# Patient Record
Sex: Male | Born: 1950 | Race: White | Hispanic: No | Smoking: Former smoker
Health system: Southern US, Community
[De-identification: ages and names within clinical notes are randomized; demographics above are authoritative.]

## PROBLEM LIST (undated history)

## (undated) DIAGNOSIS — IMO0002 Reserved for concepts with insufficient information to code with codable children: Secondary | ICD-10-CM

## (undated) DIAGNOSIS — R9430 Abnormal result of cardiovascular function study, unspecified: Secondary | ICD-10-CM

## (undated) DIAGNOSIS — R51 Headache: Secondary | ICD-10-CM

## (undated) DIAGNOSIS — I2699 Other pulmonary embolism without acute cor pulmonale: Secondary | ICD-10-CM

## (undated) DIAGNOSIS — E785 Hyperlipidemia, unspecified: Secondary | ICD-10-CM

## (undated) DIAGNOSIS — Z7901 Long term (current) use of anticoagulants: Secondary | ICD-10-CM

## (undated) DIAGNOSIS — I779 Disorder of arteries and arterioles, unspecified: Secondary | ICD-10-CM

## (undated) DIAGNOSIS — Z8719 Personal history of other diseases of the digestive system: Secondary | ICD-10-CM

## (undated) DIAGNOSIS — IMO0001 Reserved for inherently not codable concepts without codable children: Secondary | ICD-10-CM

## (undated) DIAGNOSIS — R011 Cardiac murmur, unspecified: Secondary | ICD-10-CM

## (undated) DIAGNOSIS — Z8701 Personal history of pneumonia (recurrent): Secondary | ICD-10-CM

## (undated) DIAGNOSIS — G43909 Migraine, unspecified, not intractable, without status migrainosus: Secondary | ICD-10-CM

## (undated) DIAGNOSIS — G8929 Other chronic pain: Secondary | ICD-10-CM

## (undated) DIAGNOSIS — K469 Unspecified abdominal hernia without obstruction or gangrene: Secondary | ICD-10-CM

## (undated) DIAGNOSIS — I4510 Unspecified right bundle-branch block: Secondary | ICD-10-CM

## (undated) DIAGNOSIS — I2510 Atherosclerotic heart disease of native coronary artery without angina pectoris: Secondary | ICD-10-CM

## (undated) DIAGNOSIS — I350 Nonrheumatic aortic (valve) stenosis: Secondary | ICD-10-CM

## (undated) DIAGNOSIS — R519 Headache, unspecified: Secondary | ICD-10-CM

## (undated) DIAGNOSIS — M545 Low back pain, unspecified: Secondary | ICD-10-CM

## (undated) DIAGNOSIS — J449 Chronic obstructive pulmonary disease, unspecified: Secondary | ICD-10-CM

## (undated) DIAGNOSIS — I739 Peripheral vascular disease, unspecified: Secondary | ICD-10-CM

## (undated) DIAGNOSIS — K219 Gastro-esophageal reflux disease without esophagitis: Secondary | ICD-10-CM

## (undated) DIAGNOSIS — Z720 Tobacco use: Secondary | ICD-10-CM

## (undated) DIAGNOSIS — I10 Essential (primary) hypertension: Secondary | ICD-10-CM

## (undated) HISTORY — PX: CARPAL TUNNEL RELEASE: SHX101

## (undated) HISTORY — DX: Hyperlipidemia, unspecified: E78.5

## (undated) HISTORY — DX: Atherosclerotic heart disease of native coronary artery without angina pectoris: I25.10

## (undated) HISTORY — DX: Reserved for concepts with insufficient information to code with codable children: IMO0002

## (undated) HISTORY — PX: LUMBAR DISC SURGERY: SHX700

## (undated) HISTORY — DX: Chronic obstructive pulmonary disease, unspecified: J44.9

## (undated) HISTORY — DX: Unspecified right bundle-branch block: I45.10

## (undated) HISTORY — PX: BACK SURGERY: SHX140

## (undated) HISTORY — DX: Abnormal result of cardiovascular function study, unspecified: R94.30

## (undated) HISTORY — DX: Disorder of arteries and arterioles, unspecified: I77.9

## (undated) HISTORY — DX: Unspecified abdominal hernia without obstruction or gangrene: K46.9

## (undated) HISTORY — DX: Other pulmonary embolism without acute cor pulmonale: I26.99

## (undated) HISTORY — DX: Tobacco use: Z72.0

## (undated) HISTORY — DX: Peripheral vascular disease, unspecified: I73.9

## (undated) HISTORY — PX: TONSILLECTOMY: SUR1361

## (undated) HISTORY — DX: Essential (primary) hypertension: I10

## (undated) HISTORY — DX: Nonrheumatic aortic (valve) stenosis: I35.0

## (undated) HISTORY — DX: Long term (current) use of anticoagulants: Z79.01

---

## 1996-09-02 HISTORY — PX: CHOLECYSTECTOMY: SHX55

## 2004-09-02 HISTORY — PX: POSTERIOR LUMBAR FUSION: SHX6036

## 2005-09-02 DIAGNOSIS — I2699 Other pulmonary embolism without acute cor pulmonale: Secondary | ICD-10-CM

## 2005-09-02 HISTORY — DX: Other pulmonary embolism without acute cor pulmonale: I26.99

## 2005-10-08 ENCOUNTER — Ambulatory Visit: Payer: Self-pay | Admitting: Cardiology

## 2005-11-05 ENCOUNTER — Ambulatory Visit: Payer: Self-pay | Admitting: Internal Medicine

## 2006-04-25 ENCOUNTER — Ambulatory Visit: Payer: Self-pay | Admitting: Cardiology

## 2006-04-25 ENCOUNTER — Ambulatory Visit: Payer: Self-pay | Admitting: Internal Medicine

## 2006-04-25 ENCOUNTER — Ambulatory Visit: Payer: Self-pay | Admitting: Cardiovascular Disease

## 2006-04-25 ENCOUNTER — Ambulatory Visit: Payer: Self-pay | Admitting: Endocrinology

## 2006-04-25 ENCOUNTER — Inpatient Hospital Stay (HOSPITAL_COMMUNITY): Admission: AD | Admit: 2006-04-25 | Discharge: 2006-04-29 | Payer: Self-pay | Admitting: Internal Medicine

## 2006-05-01 ENCOUNTER — Ambulatory Visit: Payer: Self-pay | Admitting: Cardiology

## 2006-05-08 ENCOUNTER — Ambulatory Visit: Payer: Self-pay | Admitting: Cardiology

## 2006-05-15 ENCOUNTER — Ambulatory Visit: Payer: Self-pay | Admitting: Cardiology

## 2006-05-19 ENCOUNTER — Ambulatory Visit: Payer: Self-pay | Admitting: Internal Medicine

## 2006-05-29 ENCOUNTER — Ambulatory Visit: Payer: Self-pay | Admitting: Cardiology

## 2006-06-19 ENCOUNTER — Ambulatory Visit: Payer: Self-pay | Admitting: Internal Medicine

## 2006-07-17 ENCOUNTER — Ambulatory Visit: Payer: Self-pay | Admitting: Cardiology

## 2006-08-14 ENCOUNTER — Ambulatory Visit: Payer: Self-pay | Admitting: Cardiology

## 2006-09-11 ENCOUNTER — Ambulatory Visit: Payer: Self-pay | Admitting: Cardiology

## 2006-10-09 ENCOUNTER — Ambulatory Visit: Payer: Self-pay | Admitting: Cardiology

## 2006-11-06 ENCOUNTER — Ambulatory Visit: Payer: Self-pay | Admitting: Cardiology

## 2006-11-25 ENCOUNTER — Ambulatory Visit: Payer: Self-pay | Admitting: Cardiology

## 2006-11-25 LAB — CONVERTED CEMR LAB
ALT: 28 units/L (ref 0–40)
AST: 27 units/L (ref 0–37)
Bilirubin, Direct: 0.1 mg/dL (ref 0.0–0.3)
Cholesterol: 167 mg/dL (ref 0–200)
HDL: 35.1 mg/dL — ABNORMAL LOW (ref >39.0)
Total Protein: 7.2 g/dL (ref 6.0–8.3)

## 2006-11-27 ENCOUNTER — Ambulatory Visit: Payer: Self-pay | Admitting: Cardiology

## 2006-12-18 ENCOUNTER — Ambulatory Visit: Payer: Self-pay | Admitting: Internal Medicine

## 2007-01-01 ENCOUNTER — Ambulatory Visit: Payer: Self-pay | Admitting: Cardiovascular Disease

## 2007-01-29 ENCOUNTER — Ambulatory Visit: Payer: Self-pay | Admitting: *Deleted

## 2007-02-12 ENCOUNTER — Ambulatory Visit: Payer: Self-pay | Admitting: Cardiology

## 2007-03-17 ENCOUNTER — Ambulatory Visit: Payer: Self-pay | Admitting: Cardiovascular Disease

## 2007-04-02 ENCOUNTER — Ambulatory Visit: Payer: Self-pay | Admitting: Internal Medicine

## 2007-04-07 ENCOUNTER — Ambulatory Visit: Payer: Self-pay | Admitting: Cardiology

## 2007-04-23 ENCOUNTER — Ambulatory Visit: Payer: Self-pay | Admitting: Cardiology

## 2007-05-08 ENCOUNTER — Ambulatory Visit: Payer: Self-pay | Admitting: Internal Medicine

## 2007-05-22 ENCOUNTER — Ambulatory Visit: Payer: Self-pay | Admitting: Cardiology

## 2007-06-12 ENCOUNTER — Ambulatory Visit: Payer: Self-pay | Admitting: Cardiology

## 2007-06-20 ENCOUNTER — Encounter: Payer: Self-pay | Admitting: Internal Medicine

## 2007-06-20 DIAGNOSIS — E785 Hyperlipidemia, unspecified: Secondary | ICD-10-CM | POA: Insufficient documentation

## 2007-06-20 DIAGNOSIS — G5600 Carpal tunnel syndrome, unspecified upper limb: Secondary | ICD-10-CM | POA: Insufficient documentation

## 2007-07-10 ENCOUNTER — Ambulatory Visit: Payer: Self-pay | Admitting: Cardiology

## 2007-08-07 ENCOUNTER — Ambulatory Visit: Payer: Self-pay | Admitting: Cardiology

## 2007-09-04 ENCOUNTER — Ambulatory Visit: Payer: Self-pay | Admitting: Cardiology

## 2007-09-16 ENCOUNTER — Ambulatory Visit: Payer: Self-pay | Admitting: Internal Medicine

## 2007-09-21 ENCOUNTER — Ambulatory Visit: Payer: Self-pay | Admitting: Cardiology

## 2007-10-02 ENCOUNTER — Ambulatory Visit: Payer: Self-pay | Admitting: Cardiology

## 2007-10-13 ENCOUNTER — Ambulatory Visit: Payer: Self-pay | Admitting: Cardiology

## 2007-10-13 ENCOUNTER — Encounter: Payer: Self-pay | Admitting: Cardiology

## 2007-10-13 ENCOUNTER — Encounter: Payer: Self-pay | Admitting: Internal Medicine

## 2007-10-16 ENCOUNTER — Ambulatory Visit: Payer: Self-pay | Admitting: Internal Medicine

## 2007-11-13 ENCOUNTER — Ambulatory Visit: Payer: Self-pay | Admitting: Internal Medicine

## 2007-12-03 ENCOUNTER — Ambulatory Visit: Payer: Self-pay | Admitting: Internal Medicine

## 2007-12-03 LAB — CONVERTED CEMR LAB
ALT: 29 units/L (ref 0–53)
Albumin: 3.5 g/dL (ref 3.5–5.2)
BUN: 11 mg/dL (ref 6–23)
Basophils Absolute: 0.1 10*3/uL (ref 0.0–0.1)
Basophils Relative: 0.6 % (ref 0.0–1.0)
CO2: 30 meq/L (ref 19–32)
Calcium: 9.1 mg/dL (ref 8.4–10.5)
Creatinine, Ser: 1.1 mg/dL (ref 0.4–1.5)
Direct LDL: 91.2 mg/dL
Eosinophils Relative: 0.9 % (ref 0.0–5.0)
Glucose, Bld: 115 mg/dL — ABNORMAL HIGH (ref 70–99)
Hemoglobin: 18.2 g/dL — ABNORMAL HIGH (ref 13.0–17.0)
Lymphocytes Relative: 21.3 % (ref 12.0–46.0)
MCHC: 32.9 g/dL (ref 30.0–36.0)
MCV: 96 fL (ref 78.0–100.0)
Neutro Abs: 8 10*3/uL — ABNORMAL HIGH (ref 1.4–7.7)
RBC: 5.78 M/uL (ref 4.22–5.81)
Total Bilirubin: 1 mg/dL (ref 0.3–1.2)
Total Protein: 7.2 g/dL (ref 6.0–8.3)
VLDL: 41 mg/dL — ABNORMAL HIGH (ref 0–40)
Vitamin B-12: 656 pg/mL (ref 211–911)

## 2007-12-05 ENCOUNTER — Encounter: Payer: Self-pay | Admitting: Internal Medicine

## 2007-12-10 ENCOUNTER — Ambulatory Visit: Payer: Self-pay | Admitting: Internal Medicine

## 2007-12-23 ENCOUNTER — Telehealth: Payer: Self-pay | Admitting: Internal Medicine

## 2007-12-25 ENCOUNTER — Telehealth: Payer: Self-pay | Admitting: Internal Medicine

## 2007-12-26 ENCOUNTER — Encounter: Payer: Self-pay | Admitting: Internal Medicine

## 2008-01-01 ENCOUNTER — Telehealth: Payer: Self-pay | Admitting: Internal Medicine

## 2008-01-04 ENCOUNTER — Encounter: Payer: Self-pay | Admitting: Internal Medicine

## 2008-01-07 ENCOUNTER — Ambulatory Visit: Payer: Self-pay | Admitting: Internal Medicine

## 2008-01-22 ENCOUNTER — Ambulatory Visit: Payer: Self-pay | Admitting: Cardiovascular Disease

## 2008-02-19 ENCOUNTER — Ambulatory Visit: Payer: Self-pay | Admitting: Internal Medicine

## 2008-03-18 ENCOUNTER — Ambulatory Visit: Payer: Self-pay | Admitting: Cardiology

## 2008-04-15 ENCOUNTER — Ambulatory Visit: Payer: Self-pay | Admitting: Internal Medicine

## 2008-05-05 ENCOUNTER — Ambulatory Visit: Payer: Self-pay | Admitting: Internal Medicine

## 2008-06-02 ENCOUNTER — Ambulatory Visit: Payer: Self-pay | Admitting: Internal Medicine

## 2008-06-23 ENCOUNTER — Ambulatory Visit: Payer: Self-pay | Admitting: Cardiology

## 2008-07-07 ENCOUNTER — Ambulatory Visit: Payer: Self-pay | Admitting: Cardiology

## 2008-08-04 ENCOUNTER — Ambulatory Visit: Payer: Self-pay | Admitting: Cardiology

## 2008-08-11 ENCOUNTER — Ambulatory Visit: Payer: Self-pay | Admitting: Cardiology

## 2008-08-29 ENCOUNTER — Ambulatory Visit: Payer: Self-pay | Admitting: Cardiology

## 2008-09-12 ENCOUNTER — Ambulatory Visit: Payer: Self-pay | Admitting: Cardiology

## 2008-10-03 ENCOUNTER — Ambulatory Visit: Payer: Self-pay | Admitting: Cardiology

## 2008-10-31 ENCOUNTER — Ambulatory Visit: Payer: Self-pay | Admitting: Cardiology

## 2008-12-01 ENCOUNTER — Ambulatory Visit: Payer: Self-pay | Admitting: Cardiovascular Disease

## 2008-12-29 ENCOUNTER — Ambulatory Visit: Payer: Self-pay | Admitting: Internal Medicine

## 2009-01-26 ENCOUNTER — Ambulatory Visit: Payer: Self-pay | Admitting: Cardiovascular Disease

## 2009-01-26 ENCOUNTER — Ambulatory Visit: Payer: Self-pay | Admitting: Internal Medicine

## 2009-01-26 DIAGNOSIS — I498 Other specified cardiac arrhythmias: Secondary | ICD-10-CM | POA: Insufficient documentation

## 2009-01-26 LAB — CONVERTED CEMR LAB
BUN: 14 mg/dL (ref 6–23)
Bilirubin, Direct: 0.3 mg/dL (ref 0.0–0.3)
Chloride: 101 meq/L (ref 96–112)
Glucose, Bld: 114 mg/dL — ABNORMAL HIGH (ref 70–99)
HDL: 30.8 mg/dL — ABNORMAL LOW (ref >39.00)
Potassium: 4.5 meq/L (ref 3.5–5.1)
Sodium: 137 meq/L (ref 135–145)
TSH: 1.6 microintl units/mL (ref 0.35–5.50)
Total Bilirubin: 1.2 mg/dL (ref 0.3–1.2)
Total CHOL/HDL Ratio: 6
VLDL: 41.2 mg/dL — ABNORMAL HIGH (ref 0.0–40.0)

## 2009-02-01 ENCOUNTER — Encounter: Payer: Self-pay | Admitting: *Deleted

## 2009-02-01 ENCOUNTER — Ambulatory Visit: Payer: Self-pay | Admitting: Internal Medicine

## 2009-02-23 ENCOUNTER — Ambulatory Visit: Payer: Self-pay | Admitting: Internal Medicine

## 2009-02-23 LAB — CONVERTED CEMR LAB: Prothrombin Time: 17.3 s

## 2009-03-08 ENCOUNTER — Encounter: Payer: Self-pay | Admitting: *Deleted

## 2009-03-23 ENCOUNTER — Ambulatory Visit: Payer: Self-pay | Admitting: Cardiovascular Disease

## 2009-03-23 LAB — CONVERTED CEMR LAB
POC INR: 2.2
Prothrombin Time: 18.2 s

## 2009-04-20 ENCOUNTER — Ambulatory Visit: Payer: Self-pay | Admitting: Cardiology

## 2009-05-11 ENCOUNTER — Ambulatory Visit: Payer: Self-pay | Admitting: Cardiovascular Disease

## 2009-05-11 LAB — CONVERTED CEMR LAB: POC INR: 1.9

## 2009-05-25 ENCOUNTER — Ambulatory Visit: Payer: Self-pay | Admitting: Internal Medicine

## 2009-06-02 ENCOUNTER — Ambulatory Visit: Payer: Self-pay | Admitting: Internal Medicine

## 2009-06-30 ENCOUNTER — Ambulatory Visit: Payer: Self-pay | Admitting: Internal Medicine

## 2009-07-28 ENCOUNTER — Ambulatory Visit: Payer: Self-pay | Admitting: Internal Medicine

## 2009-07-28 LAB — CONVERTED CEMR LAB: POC INR: 3.3

## 2009-08-16 ENCOUNTER — Ambulatory Visit: Payer: Self-pay | Admitting: Cardiology

## 2009-09-13 ENCOUNTER — Ambulatory Visit: Payer: Self-pay | Admitting: Internal Medicine

## 2009-09-27 ENCOUNTER — Ambulatory Visit: Payer: Self-pay | Admitting: Cardiology

## 2009-09-27 LAB — CONVERTED CEMR LAB: POC INR: 1.8

## 2009-10-18 ENCOUNTER — Ambulatory Visit: Payer: Self-pay | Admitting: Internal Medicine

## 2009-10-18 LAB — CONVERTED CEMR LAB: POC INR: 2.3

## 2009-11-15 ENCOUNTER — Ambulatory Visit: Payer: Self-pay | Admitting: Internal Medicine

## 2009-12-13 ENCOUNTER — Ambulatory Visit: Payer: Self-pay | Admitting: Internal Medicine

## 2009-12-13 LAB — CONVERTED CEMR LAB: POC INR: 2.6

## 2010-01-10 ENCOUNTER — Ambulatory Visit: Payer: Self-pay | Admitting: Internal Medicine

## 2010-01-10 LAB — CONVERTED CEMR LAB: POC INR: 2.6

## 2010-02-07 ENCOUNTER — Ambulatory Visit: Payer: Self-pay | Admitting: Cardiology

## 2010-03-07 ENCOUNTER — Ambulatory Visit: Payer: Self-pay | Admitting: Internal Medicine

## 2010-04-04 ENCOUNTER — Ambulatory Visit: Payer: Self-pay | Admitting: Cardiology

## 2010-05-02 ENCOUNTER — Ambulatory Visit: Payer: Self-pay | Admitting: Cardiology

## 2010-05-02 LAB — CONVERTED CEMR LAB: POC INR: 2.4

## 2010-05-30 ENCOUNTER — Ambulatory Visit: Payer: Self-pay | Admitting: Internal Medicine

## 2010-06-13 ENCOUNTER — Ambulatory Visit: Payer: Self-pay | Admitting: Cardiology

## 2010-07-11 ENCOUNTER — Ambulatory Visit: Payer: Self-pay | Admitting: Cardiovascular Disease

## 2010-07-11 LAB — CONVERTED CEMR LAB: POC INR: 2.5

## 2010-08-08 ENCOUNTER — Ambulatory Visit: Payer: Self-pay | Admitting: Internal Medicine

## 2010-08-08 LAB — CONVERTED CEMR LAB: POC INR: 2.9

## 2010-09-06 ENCOUNTER — Ambulatory Visit: Admission: RE | Admit: 2010-09-06 | Discharge: 2010-09-06 | Payer: Self-pay | Source: Home / Self Care

## 2010-09-19 ENCOUNTER — Telehealth: Payer: Self-pay | Admitting: Cardiology

## 2010-09-23 ENCOUNTER — Encounter: Payer: Self-pay | Admitting: Endocrinology

## 2010-09-24 ENCOUNTER — Encounter: Payer: Self-pay | Admitting: Cardiology

## 2010-09-24 DIAGNOSIS — J449 Chronic obstructive pulmonary disease, unspecified: Secondary | ICD-10-CM | POA: Insufficient documentation

## 2010-09-24 DIAGNOSIS — I10 Essential (primary) hypertension: Secondary | ICD-10-CM | POA: Insufficient documentation

## 2010-09-24 DIAGNOSIS — J4489 Other specified chronic obstructive pulmonary disease: Secondary | ICD-10-CM | POA: Insufficient documentation

## 2010-09-25 ENCOUNTER — Ambulatory Visit
Admission: RE | Admit: 2010-09-25 | Discharge: 2010-09-25 | Payer: Self-pay | Source: Home / Self Care | Attending: Cardiology | Admitting: Cardiology

## 2010-09-25 ENCOUNTER — Encounter: Payer: Self-pay | Admitting: Cardiology

## 2010-09-25 DIAGNOSIS — F17200 Nicotine dependence, unspecified, uncomplicated: Secondary | ICD-10-CM | POA: Insufficient documentation

## 2010-10-02 NOTE — Medication Information (Signed)
Summary: Chris Reyes  Anticoagulant Therapy  Managed by: Bethena Midget, RN, BSN Referring MD: Willa Rough MD PCP: Link Snuffer MD: Riley Kill MD, Maisie Fus Indication 1: Pulmonary Embolism and Infarction (ICD-415.1) Lab Used: [facility] Site: [facility] INR POC 2.8 INR RANGE 2 - 3  Dietary changes: no    Health status changes: no    Bleeding/hemorrhagic complications: no    Recent/future hospitalizations: no    Any changes in medication regimen? no    Recent/future dental: no  Any missed doses?: no       Is patient compliant with meds? yes       Allergies: No Known Drug Allergies  Anticoagulation Management History:      The patient is taking warfarin and comes in today for a routine follow up visit.  Negative risk factors for bleeding include an age less than 39 years old.  The bleeding index is 'low risk'.  Negative CHADS2 values include Age > 7 years old.  The start date was 04/23/2006.  Anticoagulation responsible Lalisa Kiehn: Riley Kill MD, Maisie Fus.  INR POC: 2.8.  Cuvette Lot#: [id number].  Exp: 06/2011.    Anticoagulation Management Assessment/Plan:      The patient's current anticoagulation dose is Coumadin 5 mg tabs: Take as directed by coumadin clinic..  The target INR is 2.0-3.0.  The next INR is due 05/02/2010.  Anticoagulation instructions were given to patient.  Results were reviewed/authorized by Bethena Midget, RN, BSN.  He was notified by Bethena Midget, RN, BSN.         Prior Anticoagulation Instructions: INR 2.0  Take 1.5 tablets today, then resume same dosage 1 tablet daily except 1/2 tablet on Mondays and Fridays.  Recheck in 4 weeks.   Current Anticoagulation Instructions: INR 2.8 Continue 5mg s everyday except 2.5mg  on Mondays and Fridays. Recheck in 4 weeks.

## 2010-10-02 NOTE — Medication Information (Signed)
Summary: rov/eac  Anticoagulant Therapy  Managed by: Elaina Pattee, PharmD Referring MD: Willa Rough MD PCP: Link Snuffer MD: Juanda Chance MD, Antwan Pandya Indication 1: Pulmonary Embolism and Infarction (ICD-415.1) Lab Used: [facility] Site: [facility] INR POC 2.3 INR RANGE 2 - 3  Dietary changes: no    Health status changes: no    Bleeding/hemorrhagic complications: no    Recent/future hospitalizations: no    Any changes in medication regimen? no    Recent/future dental: no  Any missed doses?: no       Is patient compliant with meds? yes       Allergies: No Known Drug Allergies  Anticoagulation Management History:      The patient is taking warfarin and comes in today for a routine follow up visit.  Negative risk factors for bleeding include an age less than 61 years old.  The bleeding index is 'low risk'.  Negative CHADS2 values include Age > 61 years old.  The start date was 04/23/2006.  Anticoagulation responsible provider: Juanda Chance MD, Smitty Cords.  INR POC: 2.3.  Cuvette Lot#: [id number].  Exp: 04/2011.    Anticoagulation Management Assessment/Plan:      The patient's current anticoagulation dose is Coumadin 5 mg tabs: Take as directed by coumadin clinic..  The target INR is 2.0-3.0.  The next INR is due 03/07/2010.  Anticoagulation instructions were given to patient.  Results were reviewed/authorized by Elaina Pattee, PharmD.  He was notified by Elaina Pattee, PharmD.         Prior Anticoagulation Instructions: INR 2.6  Continue taking 1/2 tablet on monday, and Friday and 1 tablet all other days.  Return to clinic in 4 weeks.     Current Anticoagulation Instructions: INR 2.3. Take 1 tablet daily except 0.5 tablet on Mon and Fri. Recheck in 4 weeks.

## 2010-10-02 NOTE — Medication Information (Signed)
Summary: Chris Reyes  Anticoagulant Therapy  Managed by: Cloyde Reams, RN, BSN Referring MD: Willa Rough MD PCP: Link Snuffer MD: Johney Frame MD, Fayrene Fearing Indication 1: Pulmonary Embolism and Infarction (ICD-415.1) Lab Used: [facility] Site: [facility] INR POC 2.8 INR RANGE 2 - 3  Dietary changes: no    Health status changes: no    Bleeding/hemorrhagic complications: no    Recent/future hospitalizations: no    Any changes in medication regimen? no    Recent/future dental: no  Any missed doses?: no       Is patient compliant with meds? yes       Allergies (verified): No Known Drug Allergies  Anticoagulation Management History:      The patient is taking warfarin and comes in today for a routine follow up visit.  Negative risk factors for bleeding include an age less than 30 years old.  The bleeding index is 'low risk'.  Negative CHADS2 values include Age > 66 years old.  The start date was 04/23/2006.  Anticoagulation responsible provider: Marlette Curvin MD, Fayrene Fearing.  INR POC: 2.8.  Cuvette Lot#: [id number].  Exp: 01/2011.    Anticoagulation Management Assessment/Plan:      The patient's current anticoagulation dose is Coumadin 5 mg tabs: Take as directed by coumadin clinic..  The target INR is 2.0-3.0.  The next INR is due 12/13/2009.  Anticoagulation instructions were given to patient.  Results were reviewed/authorized by Cloyde Reams, RN, BSN.  He was notified by Cloyde Reams RN.         Prior Anticoagulation Instructions: INR 2.3  Continue on same dosage 1 tablet daily except 1/2 tablet on Mondays and Fridays.   Recheck in 4 weeks.    Current Anticoagulation Instructions: INR 2.8  Continue on same dosage 1 tablet daily except 1/2 tablet on Mondays and Fridays.  Recheck in 4 weeks.

## 2010-10-02 NOTE — Medication Information (Signed)
Summary: rov/ewj  Anticoagulant Therapy  Managed by: Bethena Midget, RN, BSN Referring MD: Willa Rough MD PCP: Link Snuffer MD: Johney Frame MD, Fayrene Fearing Indication 1: Pulmonary Embolism and Infarction (ICD-415.1) Lab Used: [facility] Site: [facility] INR POC 2.6 INR RANGE 2 - 3  Dietary changes: no    Health status changes: no    Bleeding/hemorrhagic complications: no    Recent/future hospitalizations: no    Any changes in medication regimen? no    Recent/future dental: no  Any missed doses?: no       Is patient compliant with meds? yes       Allergies: No Known Drug Allergies  Anticoagulation Management History:      The patient is taking warfarin and comes in today for a routine follow up visit.  Negative risk factors for bleeding include an age less than 60 years old.  The bleeding index is 'low risk'.  Negative CHADS2 values include Age > 26 years old.  The start date was 04/23/2006.  Anticoagulation responsible provider: Jamilya Sarrazin MD, Fayrene Fearing.  INR POC: 2.6.  Cuvette Lot#: [id number].  Exp: 01/2011.    Anticoagulation Management Assessment/Plan:      The patient's current anticoagulation dose is Coumadin 5 mg tabs: Take as directed by coumadin clinic..  The target INR is 2.0-3.0.  The next INR is due 01/10/2010.  Anticoagulation instructions were given to patient.  Results were reviewed/authorized by Bethena Midget, RN, BSN.  He was notified by Bethena Midget, RN, BSN.         Prior Anticoagulation Instructions: INR 2.8  Continue on same dosage 1 tablet daily except 1/2 tablet on Mondays and Fridays.  Recheck in 4 weeks.    Current Anticoagulation Instructions: INR 2.6 Continue 5mg  daily except 2.5mg s on Mondays and Fridays. Recheck in 4 weeks.

## 2010-10-02 NOTE — Medication Information (Signed)
Summary: rov/td  Anticoagulant Therapy  Managed by: Bethena Midget, RN, BSN Referring MD: Willa Rough MD PCP: Link Snuffer MD: Graciela Husbands MD, Viviann Spare Indication 1: Pulmonary Embolism and Infarction (ICD-415.1) Lab Used: [facility] Site: [facility] INR POC 1.6 INR RANGE 2 - 3  Dietary changes: yes       Details: extra leafy green veggies  Health status changes: no    Bleeding/hemorrhagic complications: no    Recent/future hospitalizations: no    Any changes in medication regimen? no    Recent/future dental: no  Any missed doses?: no       Is patient compliant with meds? yes       Allergies: No Known Drug Allergies  Anticoagulation Management History:      The patient is taking warfarin and comes in today for a routine follow up visit.  Negative risk factors for bleeding include an age less than 59 years old.  The bleeding index is 'low risk'.  Negative CHADS2 values include Age > 86 years old.  The start date was 04/23/2006.  Anticoagulation responsible provider: Graciela Husbands MD, Viviann Spare.  INR POC: 1.6.  Cuvette Lot#: [id number].  Exp: 12/2010.    Anticoagulation Management Assessment/Plan:      The patient's current anticoagulation dose is Coumadin 5 mg tabs: Take as directed by coumadin clinic..  The target INR is 2.0-3.0.  The next INR is due 09/27/2009.  Anticoagulation instructions were given to patient.  Results were reviewed/authorized by Bethena Midget, RN, BSN.  He was notified by Bethena Midget, RN, BSN.         Prior Anticoagulation Instructions: The patient is to continue with the same dose of coumadin.  This dosage includes:   1 tab - Sun, Tue, Thur, Sat 1/2 tab - Mon, Wed, Fri  Current Anticoagulation Instructions: INR 1.6 Today take extra 2.5mg s then resume 5mg s daily except 2.5mg s on Mondays, Wednesdays and Fridays.

## 2010-10-02 NOTE — Medication Information (Signed)
Summary: rov/cb  Anticoagulant Therapy  Managed by: Cloyde Reams, RN, BSN Referring MD: Willa Rough MD PCP: Link Snuffer MD: Johney Frame MD, Fayrene Fearing Indication 1: Pulmonary Embolism and Infarction (ICD-415.1) Lab Used: [facility] Site: [facility] INR POC 2.0 INR RANGE 2 - 3  Dietary changes: no    Health status changes: no    Bleeding/hemorrhagic complications: no    Recent/future hospitalizations: no    Any changes in medication regimen? no    Recent/future dental: no  Any missed doses?: no       Is patient compliant with meds? yes       Allergies: No Known Drug Allergies  Anticoagulation Management History:      The patient is taking warfarin and comes in today for a routine follow up visit.  Negative risk factors for bleeding include an age less than 61 years old.  The bleeding index is 'low risk'.  Negative CHADS2 values include Age > 61 years old.  The start date was 04/23/2006.  Anticoagulation responsible provider: Selma Rodelo MD, Fayrene Fearing.  INR POC: 2.0.  Cuvette Lot#: [id number].  Exp: 05/2011.    Anticoagulation Management Assessment/Plan:      The patient's current anticoagulation dose is Coumadin 5 mg tabs: Take as directed by coumadin clinic..  The target INR is 2.0-3.0.  The next INR is due 04/04/2010.  Anticoagulation instructions were given to patient.  Results were reviewed/authorized by Cloyde Reams, RN, BSN.  He was notified by Cloyde Reams RN.         Prior Anticoagulation Instructions: INR 2.3. Take 1 tablet daily except 0.5 tablet on Mon and Fri. Recheck in 4 weeks.  Current Anticoagulation Instructions: INR 2.0  Take 1.5 tablets today, then resume same dosage 1 tablet daily except 1/2 tablet on Mondays and Fridays.  Recheck in 4 weeks.

## 2010-10-02 NOTE — Medication Information (Signed)
Summary: rov/sel  Anticoagulant Therapy  Managed by: Bethena Midget, RN, BSN Referring MD: Willa Rough MD PCP: Link Snuffer MD: Eden Emms MD, Theron Arista Indication 1: Pulmonary Embolism and Infarction (ICD-415.1) Lab Used: [facility] Site: [facility] INR POC 2.5 INR RANGE 2 - 3  Dietary changes: no    Health status changes: no    Bleeding/hemorrhagic complications: no    Recent/future hospitalizations: no    Any changes in medication regimen? no    Recent/future dental: no  Any missed doses?: no       Is patient compliant with meds? yes       Allergies: No Known Drug Allergies  Anticoagulation Management History:      The patient is taking warfarin and comes in today for a routine follow up visit.  Negative risk factors for bleeding include an age less than 39 years old.  The bleeding index is 'low risk'.  Negative CHADS2 values include Age > 21 years old.  The start date was 04/23/2006.  Anticoagulation responsible provider: Eden Emms MD, Theron Arista.  INR POC: 2.5.  Cuvette Lot#: [id number].  Exp: 07/2011.    Anticoagulation Management Assessment/Plan:      The patient's current anticoagulation dose is Coumadin 5 mg tabs: Take as directed by coumadin clinic..  The target INR is 2.0-3.0.  The next INR is due 08/08/2010.  Anticoagulation instructions were given to patient.  Results were reviewed/authorized by Bethena Midget, RN, BSN.  He was notified by Bethena Midget, RN, BSN.         Prior Anticoagulation Instructions: INR 2.6  Continue taking 1 tablet everyday except 1/2 tablet on Monday and Friday. Recheck in 4 weeks.   Current Anticoagulation Instructions: INR 2.5 Continue 5mg s daily except 2.5mg s on Mondays and Fridays. Recheck in 4 weeks.

## 2010-10-02 NOTE — Medication Information (Signed)
Summary: rov/tm  Anticoagulant Therapy  Managed by: Leota Sauers, PharmD, BCPS, CPP Referring MD: Willa Rough MD PCP: Link Snuffer MD: Ladona Ridgel MD, Sharlot Gowda Indication 1: Pulmonary Embolism and Infarction (ICD-415.1) Lab Used: [facility] Site: [facility] INR POC 2.9 INR RANGE 2 - 3  Dietary changes: no    Health status changes: no    Bleeding/hemorrhagic complications: no    Recent/future hospitalizations: no    Any changes in medication regimen? no    Recent/future dental: no  Any missed doses?: no       Is patient compliant with meds? yes       Current Medications (verified): 1)  Amlodipine Besylate 5 Mg Tabs (Amlodipine Besylate) .Marland Kitchen.. 1 By Mouth Once Daily 2)  Isosorbide Mononitrate Cr 30 Mg Tb24 (Isosorbide Mononitrate) .... Take 1 Tablet By Mouth Once A Day 3)  Lisinopril-Hydrochlorothiazide 20-12.5 Mg Tabs (Lisinopril-Hydrochlorothiazide) .... Take 1 Tablet By Mouth Once A Day 4)  Simvastatin 80 Mg  Tabs (Simvastatin) .... Take 1 By Mouth Qd 5)  Prevacid 15 Mg  Cpdr (Lansoprazole) .... Take 1 Tablet By Mouth Every Morning 6)  Coumadin 5 Mg Tabs (Warfarin Sodium) .... Take As Directed By Coumadin Clinic.  Allergies (verified): No Known Drug Allergies  Anticoagulation Management History:      The patient is taking warfarin and comes in today for a routine follow up visit.  Negative risk factors for bleeding include an age less than 68 years old.  The bleeding index is 'low risk'.  Negative CHADS2 values include Age > 29 years old.  The start date was 04/23/2006.  Anticoagulation responsible Devora Tortorella: Ladona Ridgel MD, Sharlot Gowda.  INR POC: 2.9.  Cuvette Lot#: [id number].  Exp: 07/2011.    Anticoagulation Management Assessment/Plan:      The patient's current anticoagulation dose is Coumadin 5 mg tabs: Take as directed by coumadin clinic..  The target INR is 2.0-3.0.  The next INR is due 09/05/2010.  Anticoagulation instructions were given to patient.  Results were  reviewed/authorized by Leota Sauers, PharmD, BCPS, CPP.         Prior Anticoagulation Instructions: INR 2.5 Continue 5mg s daily except 2.5mg s on Mondays and Fridays. Recheck in 4 weeks.   Current Anticoagulation Instructions: INR 2.9  Coumadin 5mg  tab - take 1 tab each day except 1/2 tab on MON and FRI

## 2010-10-02 NOTE — Medication Information (Signed)
Summary: rov/mow   Anticoagulant Therapy  Managed by: Weston Brass, PharmD Referring MD: Willa Rough MD PCP: Link Snuffer MD: Daleen Squibb MD, Maisie Fus Indication 1: Pulmonary Embolism and Infarction (ICD-415.1) Lab Used: [facility] Site: [facility] INR POC 2.6 INR RANGE 2 - 3  Dietary changes: no    Health status changes: no    Bleeding/hemorrhagic complications: no    Recent/future hospitalizations: no    Any changes in medication regimen? no    Recent/future dental: no  Any missed doses?: no       Is patient compliant with meds? yes       Allergies: No Known Drug Allergies  Anticoagulation Management History:      The patient is taking warfarin and comes in today for a routine follow up visit.  Negative risk factors for bleeding include an age less than 16 years old.  The bleeding index is 'low risk'.  Negative CHADS2 values include Age > 68 years old.  The start date was 04/23/2006.  Anticoagulation responsible provider: Daleen Squibb MD, Maisie Fus.  INR POC: 2.6.  Cuvette Lot#: [id number].  Exp: 07/2011.    Anticoagulation Management Assessment/Plan:      The patient's current anticoagulation dose is Coumadin 5 mg tabs: Take as directed by coumadin clinic..  The target INR is 2.0-3.0.  The next INR is due 07/11/2010.  Anticoagulation instructions were given to patient.  Results were reviewed/authorized by Weston Brass, PharmD.  He was notified by Ilean Skill D candidate.         Prior Anticoagulation Instructions: INR 3.5 Skip today's dose. Then resume normal schedule of a half tablet on mondays and fridays. And 1 tablet all other days. Recheck in 2 weeks.   Current Anticoagulation Instructions: INR 2.6  Continue taking 1 tablet everyday except 1/2 tablet on Monday and Friday. Recheck in 4 weeks.

## 2010-10-02 NOTE — Medication Information (Signed)
Summary: rov/tm  Anticoagulant Therapy  Managed by: Eda Keys, PharmD Referring MD: Willa Rough MD PCP: Link Snuffer MD: Ladona Ridgel MD, Sharlot Gowda Indication 1: Pulmonary Embolism and Infarction (ICD-415.1) Lab Used: [facility] Site: [facility] INR POC 2.6 INR RANGE 2 - 3  Dietary changes: no    Health status changes: no    Bleeding/hemorrhagic complications: no    Recent/future hospitalizations: no    Any changes in medication regimen? no    Recent/future dental: no  Any missed doses?: no       Is patient compliant with meds? yes       Allergies: No Known Drug Allergies  Anticoagulation Management History:      The patient is taking warfarin and comes in today for a routine follow up visit.  Negative risk factors for bleeding include an age less than 67 years old.  The bleeding index is 'low risk'.  Negative CHADS2 values include Age > 62 years old.  The start date was 04/23/2006.  Anticoagulation responsible provider: Ladona Ridgel MD, Sharlot Gowda.  INR POC: 2.6.  Cuvette Lot#: [id number].  Exp: 04/2011.    Anticoagulation Management Assessment/Plan:      The patient's current anticoagulation dose is Coumadin 5 mg tabs: Take as directed by coumadin clinic..  The target INR is 2.0-3.0.  The next INR is due 02/07/2010.  Anticoagulation instructions were given to patient.  Results were reviewed/authorized by Eda Keys, PharmD.  He was notified by Eda Keys.         Prior Anticoagulation Instructions: INR 2.6 Continue 5mg  daily except 2.5mg s on Mondays and Fridays. Recheck in 4 weeks.   Current Anticoagulation Instructions: INR 2.6  Continue taking 1/2 tablet on monday, and Friday and 1 tablet all other days.  Return to clinic in 4 weeks.

## 2010-10-02 NOTE — Medication Information (Signed)
Summary: rov/ewj  Anticoagulant Therapy  Managed by: Cloyde Reams, RN, BSN Referring MD: Willa Rough MD PCP: Link Snuffer MD: Johney Frame MD, Fayrene Fearing Indication 1: Pulmonary Embolism and Infarction (ICD-415.1) Lab Used: [facility] Site: [facility] INR POC 2.3 INR RANGE 2 - 3  Dietary changes: no    Health status changes: no    Bleeding/hemorrhagic complications: no    Recent/future hospitalizations: no    Any changes in medication regimen? no    Recent/future dental: no  Any missed doses?: no       Is patient compliant with meds? yes       Allergies (verified): No Known Drug Allergies  Anticoagulation Management History:      The patient is taking warfarin and comes in today for a routine follow up visit.  Negative risk factors for bleeding include an age less than 67 years old.  The bleeding index is 'low risk'.  Negative CHADS2 values include Age > 86 years old.  The start date was 04/23/2006.  Anticoagulation responsible provider: Azani Brogdon MD, Fayrene Fearing.  INR POC: 2.3.  Cuvette Lot#: [id number].  Exp: 12/2010.    Anticoagulation Management Assessment/Plan:      The patient's current anticoagulation dose is Coumadin 5 mg tabs: Take as directed by coumadin clinic..  The target INR is 2.0-3.0.  The next INR is due 11/15/2009.  Anticoagulation instructions were given to patient.  Results were reviewed/authorized by Cloyde Reams, RN, BSN.  He was notified by Cloyde Reams RN.         Prior Anticoagulation Instructions: INR 1.8  Take an extra 1/2 tablet today then start taking 1 tablet daily except 1/2 tablet on Mondays and Fridays.  Recheck in 3 weeks.    Current Anticoagulation Instructions: INR 2.3  Continue on same dosage 1 tablet daily except 1/2 tablet on Mondays and Fridays.   Recheck in 4 weeks.

## 2010-10-02 NOTE — Medication Information (Signed)
Summary: rov/sp   Anticoagulant Therapy  Managed by: Lyna Poser, PharmD Referring MD: Willa Rough MD PCP: Link Snuffer MD: Ladona Ridgel MD,Gregg Indication 1: Pulmonary Embolism and Infarction (ICD-415.1) Lab Used: [facility] Site: [facility] INR POC 3.5 INR RANGE 2 - 3  Dietary changes: no    Health status changes: no    Bleeding/hemorrhagic complications: no    Recent/future hospitalizations: no    Any changes in medication regimen? no    Recent/future dental: no  Any missed doses?: no       Is patient compliant with meds? yes      Comments: drank a little last night  Allergies: No Known Drug Allergies  Anticoagulation Management History:      The patient is taking warfarin and comes in today for a routine follow up visit.  Negative risk factors for bleeding include an age less than 51 years old.  The bleeding index is 'low risk'.  Negative CHADS2 values include Age > 12 years old.  The start date was 04/23/2006.  Anticoagulation responsible Avacyn Kloosterman: Ladona Ridgel MD,Gregg.  INR POC: 3.5.  Cuvette Lot#: [id number].  Exp: 06/2011.    Anticoagulation Management Assessment/Plan:      The patient's current anticoagulation dose is Coumadin 5 mg tabs: Take as directed by coumadin clinic..  The target INR is 2.0-3.0.  The next INR is due 06/13/2010.  Anticoagulation instructions were given to patient.  Results were reviewed/authorized by Lyna Poser, PharmD.         Prior Anticoagulation Instructions: INR 2.4  Continue 1 tablet daily except 1/2 tablet on Mon and Fri.  Return to clinic in 4 weeks.    Current Anticoagulation Instructions: INR 3.5 Skip today's dose. Then resume normal schedule of a half tablet on mondays and fridays. And 1 tablet all other days. Recheck in 2 weeks.

## 2010-10-02 NOTE — Medication Information (Signed)
Summary: rov/tm  Anticoagulant Therapy  Managed by: Cloyde Reams, RN, BSN Referring MD: Willa Rough MD PCP: Link Snuffer MD: Juanda Chance MD, Mackinsey Pelland Indication 1: Pulmonary Embolism and Infarction (ICD-415.1) Lab Used: [facility] Site: [facility] INR POC 1.8 INR RANGE 2 - 3  Dietary changes: no    Health status changes: no    Bleeding/hemorrhagic complications: no    Recent/future hospitalizations: no    Any changes in medication regimen? no    Recent/future dental: no  Any missed doses?: no       Is patient compliant with meds? yes       Allergies (verified): No Known Drug Allergies  Anticoagulation Management History:      The patient is taking warfarin and comes in today for a routine follow up visit.  Negative risk factors for bleeding include an age less than 38 years old.  The bleeding index is 'low risk'.  Negative CHADS2 values include Age > 41 years old.  The start date was 04/23/2006.  Anticoagulation responsible provider: Juanda Chance MD, Smitty Cords.  INR POC: 1.8.  Cuvette Lot#: [id number].  Exp: 12/2010.    Anticoagulation Management Assessment/Plan:      The patient's current anticoagulation dose is Coumadin 5 mg tabs: Take as directed by coumadin clinic..  The target INR is 2.0-3.0.  The next INR is due 10/18/2009.  Anticoagulation instructions were given to patient.  Results were reviewed/authorized by Cloyde Reams, RN, BSN.  He was notified by Cloyde Reams RN.         Prior Anticoagulation Instructions: INR 1.6 Today take extra 2.5mg s then resume 5mg s daily except 2.5mg s on Mondays, Wednesdays and Fridays.   Current Anticoagulation Instructions: INR 1.8  Take an extra 1/2 tablet today then start taking 1 tablet daily except 1/2 tablet on Mondays and Fridays.  Recheck in 3 weeks.

## 2010-10-02 NOTE — Medication Information (Signed)
Summary: rov/tm  Anticoagulant Therapy  Managed by: Weston Brass, PharmD Referring MD: Willa Rough MD PCP: Link Snuffer MD: [name] MD, Tinnie Gens Indication 1: Pulmonary Embolism and Infarction (ICD-415.1) Lab Used: [facility] Site: [facility] INR POC 2.4 INR RANGE 2 - 3  Dietary changes: no    Health status changes: no    Bleeding/hemorrhagic complications: no    Recent/future hospitalizations: no    Any changes in medication regimen? no    Recent/future dental: no  Any missed doses?: no       Is patient compliant with meds? yes       Allergies: No Known Drug Allergies  Anticoagulation Management History:      The patient is taking warfarin and comes in today for a routine follow up visit.  Negative risk factors for bleeding include an age less than 15 years old.  The bleeding index is 'low risk'.  Negative CHADS2 values include Age > 10 years old.  The start date was 04/23/2006.  Anticoagulation responsible Cheron Pasquarelli: [name] MD, Tinnie Gens.  INR POC: 2.4.  Cuvette Lot#: [id number].  Exp: 06/2011.    Anticoagulation Management Assessment/Plan:      The patient's current anticoagulation dose is Coumadin 5 mg tabs: Take as directed by coumadin clinic..  The target INR is 2.0-3.0.  The next INR is due 05/30/2010.  Anticoagulation instructions were given to patient.  Results were reviewed/authorized by Weston Brass, PharmD.  He was notified by Liana Gerold, PharmD Candidate.         Prior Anticoagulation Instructions: INR 2.8 Continue 5mg s everyday except 2.5mg  on Mondays and Fridays. Recheck in 4 weeks.   Current Anticoagulation Instructions: INR 2.4  Continue 1 tablet daily except 1/2 tablet on Mon and Fri.  Return to clinic in 4 weeks.

## 2010-10-04 ENCOUNTER — Encounter (INDEPENDENT_AMBULATORY_CARE_PROVIDER_SITE_OTHER): Payer: Medicare Other

## 2010-10-04 ENCOUNTER — Ambulatory Visit: Admit: 2010-10-04 | Payer: Self-pay

## 2010-10-04 ENCOUNTER — Encounter: Payer: Self-pay | Admitting: Cardiology

## 2010-10-04 DIAGNOSIS — I2699 Other pulmonary embolism without acute cor pulmonale: Secondary | ICD-10-CM

## 2010-10-04 DIAGNOSIS — Z7901 Long term (current) use of anticoagulants: Secondary | ICD-10-CM

## 2010-10-04 NOTE — Medication Information (Signed)
Summary: [facility]v coumadin - [facility]  Anticoagulant Therapy  Managed by: Cloyde Reams, RN, BSN Referring MD: Willa Rough MD PCP: Link Snuffer MD: [name] MD, Tinnie Gens Indication 1: Pulmonary Embolism and Infarction (ICD-415.1) Lab Used: [facility] Site: [facility] INR POC 3.4 INR RANGE 2 - 3  Dietary changes: yes       Details: Had ETOH last night.   Health status changes: no    Bleeding/hemorrhagic complications: no    Recent/future hospitalizations: no    Any changes in medication regimen? no    Recent/future dental: no  Any missed doses?: no       Is patient compliant with meds? yes       Allergies: No Known Drug Allergies  Anticoagulation Management History:      The patient is taking warfarin and comes in today for a routine follow up visit.  Negative risk factors for bleeding include an age less than 13 years old.  The bleeding index is 'low risk'.  Negative CHADS2 values include Age > 45 years old.  The start date was 04/23/2006.  Anticoagulation responsible provider: [name] MD, Tinnie Gens.  INR POC: 3.4.  Cuvette Lot#: [id number].  Exp: 10/2011.    Anticoagulation Management Assessment/Plan:      The patient's current anticoagulation dose is Coumadin 5 mg tabs: Take as directed by coumadin clinic..  The target INR is 2.0-3.0.  The next INR is due 10/04/2010.  Anticoagulation instructions were given to patient.  Results were reviewed/authorized by Cloyde Reams, RN, BSN.  He was notified by Cloyde Reams RN.         Prior Anticoagulation Instructions: INR 2.9  Coumadin 5mg  tab - take 1 tab each day except 1/2 tab on MON and FRI  Current Anticoagulation Instructions: INR 3.4  Skip tomorrow's dosage of Coumadin, then resume same dosage 1 tablet daily except 1/2 tablet on Mondays and Fridays.  Recheck in 4 weeks.

## 2010-10-04 NOTE — Assessment & Plan Note (Signed)
Summary: EC6. CAD. PT HAS MEDICARE. GD  Medications Added ATORVASTATIN CALCIUM 80 MG TABS (ATORVASTATIN CALCIUM) Take 1 tab by mouth at bedtime      Allergies Added: NKDA  Visit Type:  Follow-up Primary Provider:  Jacques Navy MD  CC:  CAD and aortic stenosis.  History of Present Illness: The patient is seen for followup coronary artery disease and aortic stenosis and hypertension and a history of pulmonary emboli and bradycardia.  I saw him last March, 2008.  At that time was stable..  We made plans for two-dimensional echo.  The study revealed moderate aortic stenosis.  The study was done in February, 2009.  The study was technically difficult and could not completely rule out bicuspid aortic valve.  Patient is not having chest pain.  He does not have significant shortness of breath.  He is significantly overweight.  He has not had syncope or presyncope.  Current Medications (verified): 1)  Amlodipine Besylate 5 Mg Tabs (Amlodipine Besylate) .Marland Kitchen.. 1 By Mouth Once Daily 2)  Isosorbide Mononitrate Cr 30 Mg Tb24 (Isosorbide Mononitrate) .... Take 1 Tablet By Mouth Once A Day 3)  Lisinopril-Hydrochlorothiazide 20-12.5 Mg Tabs (Lisinopril-Hydrochlorothiazide) .... Take 1 Tablet By Mouth Once A Day 4)  Simvastatin 80 Mg  Tabs (Simvastatin) .... Take 1 By Mouth Qd 5)  Coumadin 5 Mg Tabs (Warfarin Sodium) .... Take As Directed By Coumadin Clinic.  Allergies (verified): No Known Drug Allergies  Past History:  Past Medical History: CAD    catheterization in [place] July, 2006, EF 45%, 50% mid LAD, 50-60% RCA, 90% marginal, possible collateral from the LAD to a branch of the posterior descending,   medical therapy GI bleed    2006...no significant abnormalities per patient dyslipidemia PAD EF 45%, catheterization 2006  /  EF 60%... echo ( technically limited).. February of 2009, diastolic dysfunction Aortic stenosis    moderate.... echo... February, 2009... cannot rule out bicuspid  aortic valve Rule out bicuspid aortic valve COPD Hypertension Lumbar disc surgery and gallbladder surgery IRBBB Pulmonary emboli    significant... August, 2007.... Coumadin therapy.. Coumadin therapy bradycardia Tobacco abuse  Review of Systems       Patient denies fever, chills, headache, sweats, rash, change in vision, change in hearing, chest pain, cough, nausea vomiting, urinary symptoms.  All other systems are reviewed and are negative  Vital Signs:  Patient profile:   60 year old male Height:      71 inches Weight:      296 pounds BMI:     41.43 Pulse rate:   92 / minute BP sitting:   130 / 82  (left arm) Cuff size:   large  Vitals Entered By: Hardin Negus, RMA (September 25, 2010 2:53 PM)  Physical Exam  General:  Patient is significantly overweight but stable. Head:  head is atraumatic Eyes:  no xanthelasma. Neck:  no jugular venous distention. left carotid bruit versus radiated murmur Chest Wall:  no chest wall tenderness. Lungs:  lungs reveal a few scattered rhonchi.  No rales are heard.  There is no respiratory distress. Heart:  cardiac exam reveals S1-S2.  There is a 3/6 crescendo decrescendo murmur of aortic stenosis. Abdomen:  abdomen is quite protuberant. Msk:  no musculoskeletal deformities. Extremities:  no peripheral edema. Skin:  no skin rashes. Psych:  patient is oriented to person time and place.  Affect is normal.   Impression & Recommendations:  Problem # 1:  COUMADIN THERAPY (ICD-V58.61) The patient is on Coumadin and  this has continued with his history of pulmonary emboli  Problem # 2:  RBBB (ICD-426.4) In. the past the patient had incomplete right bundle branch block.  His QRS is widened and he now has right bundle branch block. They're old inferior Q waves.  There are no diagnostic changes.  No further workup is necessary for this finding  Problem # 3:  HYPERTENSION (ICD-401.9)  His updated medication list for this problem includes:     Amlodipine Besylate 5 Mg Tabs (Amlodipine besylate) .Marland Kitchen... 1 by mouth once daily    Lisinopril-hydrochlorothiazide 20-12.5 Mg Tabs (Lisinopril-hydrochlorothiazide) .Marland Kitchen... Take 1 tablet by mouth once a day Blood pressure is controlled today.  No change in therapy.  Problem # 4:  * RULE OUT BICUSPID AORTIC VALVE We know that  the patient has aortic stenosis.  His murmur seems louder. He needs a followup echo to reassess severity of aortic stenosis.  I am hopeful we can take a better look at his aortic valve and decide if it is bicuspid or not.  We need to assess his aortic root.  Problem # 5:  BRADYCARDIA (ICD-427.89) the patient's heart rate is stable.  Bradycardic he certainly is not a problem today.  Problem # 6:  HYPERLIPIDEMIA (ICD-272.4)  His updated medication list for this problem includes:    Atorvastatin Calcium 80 Mg Tabs (Atorvastatin calcium) .Marland Kitchen... Take 1 tab by mouth at bedtime Patient has been on simvastatin 80 mg.  He has been on this for a prolonged period of time.  However he is on amlodipine.  It would be best to switch him to Lipitor  Problem # 7:  CORONARY ARTERY DISEASE (ICD-414.00) Patient has known coronary disease.  He is not having significant symptoms.  His catheterization had been done in 2006.  He has not had any exercise testing since.  I will be doing other studies and I will see him back to help decide the next steps in this regard. Patient has been on Coumadin.  He has never had bleeding problems.  We'll start aspirin 81 mg every other day  Problem # 8:  * LEFT CAROTID BRUIT VERSUS MURMUR The patient has a left carotid bruit versus murmur.  He has known vascular disease.  I do not have records of any carotid Dopplers since seeing him in 2006.  Carotid Doppler will be obtained.  Problem # 9:  TOBACCO ABUSE (ICD-305.1) The patient has returned to smoking approximately 10 cigarettes a day.  I had a good discussion with him and counseled him to stop.  Other  Orders: EKG w/ Interpretation (93000) Carotid Duplex (Carotid Duplex) Echocardiogram (Echo)  Patient Instructions: 1)  Stop Simvastatin 2)  Start Atorvastatin 80mg  daily 3)  Your physician discussed the risks, benefits and indications for preventive aspirin therapy. It is recommended that you start (or continue) taking 81 mg of aspirin a day. 4)  Your physician recommends that you return for a FASTING lipid and liver profile: in 8 weeks (272.0, 414.01) 5)  Your physician has requested that you have a carotid duplex. This test is an ultrasound of the carotid arteries in your neck. It looks at blood flow through these arteries that supply the brain with blood. Allow one hour for this exam. There are no restrictions or special instructions. 6)  Your physician has requested that you have an echocardiogram.  Echocardiography is a painless test that uses sound waves to create images of your heart. It provides your doctor with information about the size  and shape of your heart and how well your heart's chambers and valves are working.  This procedure takes approximately one hour. There are no restrictions for this procedure. 7)  Follow up in 4-6 weeks Prescriptions: ATORVASTATIN CALCIUM 80 MG TABS (ATORVASTATIN CALCIUM) Take 1 tab by mouth at bedtime  #30 x 6   Entered by:   Meredith Staggers, RN   Authorized by:   Talitha Givens, MD, [facility]   Signed by:   Meredith Staggers, RN on 09/25/2010   Method used:   Electronically to        [facility]  [phone]* (retail)       [address] [place], [place]  [address]       Ph: [phone] or [phone]       Fax: [phone]   RxID:   [id number]

## 2010-10-04 NOTE — Progress Notes (Signed)
Summary: REFERRAL  Phone Note Call from Patient   Summary of Call: Pt left vm - wants to know about refill.  Initial call taken by: Lamar Sprinkles, CMA,  September 19, 2010 4:30 PM  Follow-up for Phone Call        Spoke w/pt. He needs a referral to see Dr [name] for f/u. Was a patient and called cardiology but they told patient he needed referral. This was due to fact that pt was last seen by Dr [name] in 2008 (although he see's coumadin clinic on reg basis).   Pt has no complaints and just knows he needs cardiology f/u.  Follow-up by: Lamar Sprinkles, CMA,  September 19, 2010 4:59 PM  Additional Follow-up for Phone Call Additional follow up Details #1::        Seems odd to me that an established patient, even if 3 years since last visit, needs an referral, but....OK Additional Follow-up by: Jacques Navy MD,  September 20, 2010 9:01 AM    Additional Follow-up for Phone Call Additional follow up Details #2::    Agree Talitha Givens, MD, [facility]  September 23, 2010 2:31 PM  pt has appt 1/24 Meredith Staggers, RN  September 24, 2010 11:21 AM

## 2010-10-04 NOTE — Miscellaneous (Signed)
  Clinical Lists Changes  Problems: Added new problem of AORTIC STENOSIS (ICD-424.1) Added new problem of * RULE OUT BICUSPID AORTIC VALVE Added new problem of COPD (ICD-496) Added new problem of HYPERTENSION (ICD-401.9) Added new problem of * IRBBB Added new problem of PULMONARY EMBOLISM (ICD-415.19) Added new problem of COUMADIN THERAPY (ICD-V58.61) Observations: Added new observation of PAST MED HX: CAD    catheterization in [place] July, 2006, EF 45%, 50% mid LAD, 50-60% RCA, 90% marginal, possible collateral from the LAD to a branch of the posterior descending,   medical therapy GI bleed    2006...no significant abnormalities per patient dyslipidemia PAD EF 45%, catheterization 2006  /  EF 60%... echo ( technically limited).. February of 2009, diastolic dysfunction Aortic stenosis    moderate.... echo... February, 2009... cannot rule out bicuspid aortic valve Rule out bicuspid aortic valve COPD Hypertension Lumbar disc surgery and gallbladder surgery IRBBB Pulmonary emboli    significant... August, 2007.... Coumadin therapy Coumadin therapy bradycardia (09/24/2010 16:42) Added new observation of PRIMARY MD: Norins (09/24/2010 16:42)       Past History:  Past Medical History: CAD    catheterization in [place] July, 2006, EF 45%, 50% mid LAD, 50-60% RCA, 90% marginal, possible collateral from the LAD to a branch of the posterior descending,   medical therapy GI bleed    2006...no significant abnormalities per patient dyslipidemia PAD EF 45%, catheterization 2006  /  EF 60%... echo ( technically limited).. February of 2009, diastolic dysfunction Aortic stenosis    moderate.... echo... February, 2009... cannot rule out bicuspid aortic valve Rule out bicuspid aortic valve COPD Hypertension Lumbar disc surgery and gallbladder surgery IRBBB Pulmonary emboli    significant... August, 2007.... Coumadin therapy Coumadin therapy bradycardia

## 2010-10-10 NOTE — Medication Information (Signed)
Summary: rov/ewj  Anticoagulant Therapy  Managed by: Cloyde Reams, RN, BSN Referring MD: Willa Rough MD PCP: Jacques Navy MD Supervising MD: Shirlee Latch MD, Dalton Indication 1: Pulmonary Embolism and Infarction (ICD-415.1) Lab Used: [facility] Site: [facility] INR RANGE 2 - 3  Dietary changes: no    Health status changes: no    Bleeding/hemorrhagic complications: no    Recent/future hospitalizations: no    Any changes in medication regimen? yes       Details: Added Lipitor 80mg  daily and 81mg  ASA qod   Recent/future dental: no  Any missed doses?: no       Is patient compliant with meds? yes       Allergies: No Known Drug Allergies  Anticoagulation Management History:      The patient is taking warfarin and comes in today for a routine follow up visit.  Negative risk factors for bleeding include an age less than 67 years old.  The bleeding index is 'low risk'.  Positive CHADS2 values include History of HTN.  Negative CHADS2 values include Age > 34 years old.  The start date was 04/23/2006.  Anticoagulation responsible provider: Shirlee Latch MD, Dalton.  Cuvette Lot#: [id number].  Exp: 09/2011.    Anticoagulation Management Assessment/Plan:      The patient's current anticoagulation dose is Coumadin 5 mg tabs: Take as directed by coumadin clinic..  The target INR is 2.0-3.0.  The next INR is due 10/25/2010.  Anticoagulation instructions were given to patient.  Results were reviewed/authorized by Cloyde Reams, RN, BSN.  He was notified by Cloyde Reams RN.         Prior Anticoagulation Instructions: INR 3.4  Skip tomorrow's dosage of Coumadin, then resume same dosage 1 tablet daily except 1/2 tablet on Mondays and Fridays.  Recheck in 4 weeks.   Current Anticoagulation Instructions: INR 3.2  Skip tomorrow's dosage of Coumadin, then start taking 1 tablet daily except 1/2 tablet on Mondays, Wednesdays, and Fridays.  Recheck in 3 weeks.

## 2010-10-23 DIAGNOSIS — I2699 Other pulmonary embolism without acute cor pulmonale: Secondary | ICD-10-CM

## 2010-10-25 ENCOUNTER — Encounter: Payer: Self-pay | Admitting: Internal Medicine

## 2010-10-25 ENCOUNTER — Encounter (INDEPENDENT_AMBULATORY_CARE_PROVIDER_SITE_OTHER): Payer: Medicare Other

## 2010-10-25 DIAGNOSIS — Z7901 Long term (current) use of anticoagulants: Secondary | ICD-10-CM

## 2010-10-25 DIAGNOSIS — I2699 Other pulmonary embolism without acute cor pulmonale: Secondary | ICD-10-CM

## 2010-10-30 NOTE — Medication Information (Signed)
Summary: rov/ewj   Anticoagulant Therapy  Managed by: Weston Brass, PharmD Referring MD: Willa Rough MD PCP: Jacques Navy MD Supervising MD: Ladona Ridgel MD, Sharlot Gowda Indication 1: Pulmonary Embolism and Infarction (ICD-415.1) Lab Used: [facility]  Site: [facility] INR POC 2.0 INR RANGE 2 - 3  Dietary changes: no    Health status changes: no    Bleeding/hemorrhagic complications: no    Recent/future hospitalizations: no    Any changes in medication regimen? no    Recent/future dental: no  Any missed doses?: no       Is patient compliant with meds? yes       Allergies: No Known Drug Allergies  Anticoagulation Management History:      The patient is taking warfarin and comes in today for a routine follow up visit.  Negative risk factors for bleeding include an age less than 26 years old.  The bleeding index is 'low risk'.  Positive CHADS2 values include History of HTN.  Negative CHADS2 values include Age > 70 years old.  The start date was 04/23/2006.  Anticoagulation responsible provider: Ladona Ridgel MD, Sharlot Gowda.  INR POC: 2.0.  Cuvette Lot#: [id number].  Exp: 09/2011.    Anticoagulation Management Assessment/Plan:      The patient's current anticoagulation dose is Coumadin 5 mg tabs: Take as directed by coumadin clinic..  The target INR is 2.0-3.0.  The next INR is due 11/15/2010.  Anticoagulation instructions were given to patient.  Results were reviewed/authorized by Weston Brass, PharmD.  He was notified by Weston Brass PharmD.         Prior Anticoagulation Instructions: INR 3.2  Skip tomorrow's dosage of Coumadin, then start taking 1 tablet daily except 1/2 tablet on Mondays, Wednesdays, and Fridays.  Recheck in 3 weeks.    Current Anticoagulation Instructions: INR 2.0  Restart previous dose of 1 tablet every day except 1/2 tablet on Monday and Friday.  Recheck INR in 3 weeks.

## 2010-11-06 ENCOUNTER — Encounter: Payer: Self-pay | Admitting: Internal Medicine

## 2010-11-06 ENCOUNTER — Other Ambulatory Visit: Payer: Medicare Other

## 2010-11-06 ENCOUNTER — Encounter (INDEPENDENT_AMBULATORY_CARE_PROVIDER_SITE_OTHER): Payer: Medicare Other | Admitting: Internal Medicine

## 2010-11-06 ENCOUNTER — Other Ambulatory Visit: Payer: Self-pay | Admitting: Internal Medicine

## 2010-11-06 DIAGNOSIS — I10 Essential (primary) hypertension: Secondary | ICD-10-CM

## 2010-11-06 DIAGNOSIS — E785 Hyperlipidemia, unspecified: Secondary | ICD-10-CM

## 2010-11-06 DIAGNOSIS — K429 Umbilical hernia without obstruction or gangrene: Secondary | ICD-10-CM | POA: Insufficient documentation

## 2010-11-06 DIAGNOSIS — Z23 Encounter for immunization: Secondary | ICD-10-CM

## 2010-11-06 DIAGNOSIS — D582 Other hemoglobinopathies: Secondary | ICD-10-CM

## 2010-11-06 DIAGNOSIS — I428 Other cardiomyopathies: Secondary | ICD-10-CM

## 2010-11-06 DIAGNOSIS — Z7901 Long term (current) use of anticoagulants: Secondary | ICD-10-CM

## 2010-11-06 DIAGNOSIS — Z125 Encounter for screening for malignant neoplasm of prostate: Secondary | ICD-10-CM

## 2010-11-06 DIAGNOSIS — J449 Chronic obstructive pulmonary disease, unspecified: Secondary | ICD-10-CM

## 2010-11-06 LAB — BASIC METABOLIC PANEL
CO2: 30 mEq/L (ref 19–32)
Chloride: 100 mEq/L (ref 96–112)
GFR: 91.3 mL/min (ref >60.00)
Glucose, Bld: 92 mg/dL (ref 70–99)
Potassium: 4.6 mEq/L (ref 3.5–5.1)
Sodium: 136 mEq/L (ref 135–145)

## 2010-11-06 LAB — CBC WITH DIFFERENTIAL/PLATELET
Basophils Relative: 0.4 % (ref 0.0–3.0)
Eosinophils Relative: 1 % (ref 0.0–5.0)
Hemoglobin: 18.3 g/dL — ABNORMAL HIGH (ref 13.0–17.0)
MCV: 95 fl (ref 78.0–100.0)
Monocytes Absolute: 0.9 10*3/uL (ref 0.1–1.0)
Neutrophils Relative %: 72.3 % (ref 43.0–77.0)
RBC: 5.5 Mil/uL (ref 4.22–5.81)
WBC: 13 10*3/uL — ABNORMAL HIGH (ref 4.5–10.5)

## 2010-11-06 LAB — PSA: PSA: 4.7 ng/mL — ABNORMAL HIGH (ref 0.10–4.00)

## 2010-11-06 LAB — LIPID PANEL
HDL: 30.3 mg/dL — ABNORMAL LOW (ref >39.00)
LDL Cholesterol: 98 mg/dL (ref 0–99)
Total CHOL/HDL Ratio: 5
VLDL: 28.8 mg/dL (ref 0.0–40.0)

## 2010-11-06 LAB — HEPATIC FUNCTION PANEL
ALT: 27 U/L (ref 0–53)
Total Bilirubin: 0.8 mg/dL (ref 0.3–1.2)

## 2010-11-07 ENCOUNTER — Ambulatory Visit (HOSPITAL_COMMUNITY): Payer: Medicare Other | Attending: Cardiology

## 2010-11-07 ENCOUNTER — Encounter: Payer: Self-pay | Admitting: Cardiology

## 2010-11-07 ENCOUNTER — Other Ambulatory Visit (HOSPITAL_COMMUNITY): Payer: Self-pay

## 2010-11-07 ENCOUNTER — Encounter (INDEPENDENT_AMBULATORY_CARE_PROVIDER_SITE_OTHER): Payer: Medicare Other

## 2010-11-07 DIAGNOSIS — I2510 Atherosclerotic heart disease of native coronary artery without angina pectoris: Secondary | ICD-10-CM | POA: Insufficient documentation

## 2010-11-07 DIAGNOSIS — J449 Chronic obstructive pulmonary disease, unspecified: Secondary | ICD-10-CM | POA: Insufficient documentation

## 2010-11-07 DIAGNOSIS — I359 Nonrheumatic aortic valve disorder, unspecified: Secondary | ICD-10-CM

## 2010-11-07 DIAGNOSIS — I4510 Unspecified right bundle-branch block: Secondary | ICD-10-CM | POA: Insufficient documentation

## 2010-11-07 DIAGNOSIS — F17200 Nicotine dependence, unspecified, uncomplicated: Secondary | ICD-10-CM | POA: Insufficient documentation

## 2010-11-07 DIAGNOSIS — J4489 Other specified chronic obstructive pulmonary disease: Secondary | ICD-10-CM | POA: Insufficient documentation

## 2010-11-07 DIAGNOSIS — E785 Hyperlipidemia, unspecified: Secondary | ICD-10-CM | POA: Insufficient documentation

## 2010-11-07 DIAGNOSIS — I6529 Occlusion and stenosis of unspecified carotid artery: Secondary | ICD-10-CM

## 2010-11-07 DIAGNOSIS — R0989 Other specified symptoms and signs involving the circulatory and respiratory systems: Secondary | ICD-10-CM

## 2010-11-07 DIAGNOSIS — I10 Essential (primary) hypertension: Secondary | ICD-10-CM | POA: Insufficient documentation

## 2010-11-07 DIAGNOSIS — Z86711 Personal history of pulmonary embolism: Secondary | ICD-10-CM | POA: Insufficient documentation

## 2010-11-07 LAB — IBC PANEL
Iron: 70 ug/dL (ref 42–165)
Saturation Ratios: 20.1 % (ref 20.0–50.0)

## 2010-11-07 LAB — FERRITIN: Ferritin: 223.3 ng/mL (ref 22.0–322.0)

## 2010-11-13 NOTE — Assessment & Plan Note (Signed)
Summary: yearly exam/medicare/cd   Vital Signs:  Patient profile:   60 year old male Height:      71 inches Weight:      294 pounds BMI:     41.15 O2 Sat:      95 % on Room air Temp:     98.4 degrees F oral Pulse rate:   96 / minute BP sitting:   122 / 82  (left arm) Cuff size:   large  Vitals Entered By: Ami Bullins CMA (November 06, 2010 1:16 PM)  O2 Flow:  Room air  Vision Screening:      Vision Comments: Eye Exam 3 years ago   Primary Care Provider:  Jacques Navy MD   History of Present Illness: Chris Reyes returns for routine medical folow-up. In the interval since his last visit he has been medically stable and doing well. He did see Dr. [name] in January and is scheduled for follow-up carotid dopplers and 2D echo. He has had no chest pain or other cardiac symptoms.  His umbilical hernia has enlarged but remained painless and reducible.  He has had no injurys or surgerys.  Preventive Screening-Counseling & Management  Alcohol-Tobacco     Alcohol drinks/day: <1     Alcohol type: rum     Smoking Status: current     Packs/Day: 1/2 pack per day  Caffeine-Diet-Exercise     Caffeine use/day: 2 cups per day     Does Patient Exercise: yes     Type of exercise: walking  Hep-HIV-STD-Contraception     Dental Visit-last 6 months no     Sun Exposure-Excessive: no  Safety-Violence-Falls     Seat Belt Use: yes     Helmet Use: n/a     Firearms in the Home: no firearms in the home     Smoke Detectors: yes     Violence in the Home: no risk noted     Sexual Abuse: no     Fall Risk: low fall risk  Allergies: No Known Drug Allergies  Past History:  Past Medical History: Last updated: 09/25/2010 CAD    catheterization in [place] July, 2006, EF 45%, 50% mid LAD, 50-60% RCA, 90% marginal, possible collateral from the LAD to a branch of the posterior descending,   medical therapy GI bleed    2006...no significant abnormalities per patient dyslipidemia PAD EF  45%, catheterization 2006  /  EF 60%... echo ( technically limited).. February of 2009, diastolic dysfunction Aortic stenosis    moderate.... echo... February, 2009... cannot rule out bicuspid aortic valve Rule out bicuspid aortic valve COPD Hypertension Lumbar disc surgery and gallbladder surgery IRBBB Pulmonary emboli    significant... August, 2007.... Coumadin therapy.. 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  Social History: Packs/Day:  1/2 pack per day Caffeine use/day:  2 cups per day Does Patient Exercise:  yes Dental Care w/in 6 mos.:  no Sun Exposure-Excessive:  no Seat Belt Use:  yes Fall Risk:  low fall risk  Review of Systems  The patient denies anorexia, fever, weight loss, weight gain, vision loss, decreased hearing, chest pain, syncope, dyspnea on exertion, prolonged cough, abdominal pain, severe indigestion/heartburn, incontinence, muscle weakness, depression, abnormal bleeding, and enlarged lymph nodes.    Physical Exam  General:  Large framed overweight white male in no distress Head:  normocephalic, atraumatic, and no abnormalities observed.   Eyes:  vision grossly intact, pupils equal, pupils round, corneas and lenses clear, no injection, and no optic disk abnormalities.   Ears:  External ear exam shows no significant lesions or deformities.  Otoscopic examination reveals clear canals,  tympanic membranes are intact bilaterally without bulging, retraction, inflammation or discharge. Hearing is grossly normal bilaterally. Nose:  no external deformity and no external erythema.   Mouth:  no oral lesions, throat clear Neck:  supple, full ROM, and no thyromegaly.   Chest Wall:  no deformities and no tenderness. Increased A-P diameter Lungs:  normal respiratory effort, normal breath sounds, no crackles, and no wheezes.   Heart:  normal rate, regular rhythm, no murmur, and no JVD.   Abdomen:  obese, soft, non-tender, normal bowel sounds, no guarding, and no hepatomegaly.  4-5 cm umbilical hernia that is not completely reducible, but not tender. Prostate:  deferred to PSA Msk:  normal ROM, no joint tenderness, no joint swelling, no joint warmth, and no joint instability.   Pulses:  2+ radial pulese Extremities:  No clubbing, cyanosis, edema, or deformity noted with normal full range of motion of all joints.   Neurologic:  alert & oriented X3, cranial nerves II-XII intact, and gait normal.   Skin:  Skin is dry without lesions. No suspicious lesions - head, arms, chest Cervical Nodes:  no anterior cervical adenopathy and no posterior cervical adenopathy.   Psych:  Oriented X3, normally interactive, good eye contact, and not anxious appearing.     Impression & Recommendations:  Problem # 1:  HERNIA, UMBILICAL (ICD-553.1)  Patient with an enlarging umbilical hernia.  Plan - refer to Dr. Violeta Gelinas for surgical evaluation  Orders: Surgical Referral (Surgery)  Problem # 2:  * LEFT CAROTID BRUIT VERSUS MURMUR for follow-up carotid doppler  Problem # 3:  HYPERTENSION (ICD-401.9)  His updated medication list for this problem includes:    Amlodipine Besylate 5 Mg Tabs (Amlodipine besylate) .Marland Kitchen... 1 by mouth once daily    Losartan Potassium-hctz 50-12.5 Mg Tabs (Losartan potassium-hctz) .Marland Kitchen... 1 by mouth once daily  Orders: TLB-BMP (Basic Metabolic Panel-BMET)  (80048-METABOL)  BP today: 122/82 Prior BP: 130/82 (09/25/2010)  Good control on present medication, however he has a persistent cough. Plan - change to ARB therapy - losartan. Will need follow-up BP check.  Problem # 4:  COPD (ICD-496) No respiratory distress. Last chest x-ray reveiwed - no severe changes. Patient does have elevated hemoglobin: question is secondary to COPD or primary iron metabolism derangement.  Plan - additional lab: iron panel and serum ferritin.  Problem # 5:  AORTIC STENOSIS (ICD-424.1) Patient is for 2D echo for valvular assessment in follow-up.  His updated medication list for this problem includes:    Coumadin 5 Mg Tabs (Warfarin sodium) .Marland Kitchen... Take as directed by coumadin clinic.    Aspirin 81 Mg Tabs (Aspirin) .Marland Kitchen... Take one tablet by mouth every other day  Problem # 6:  PULMONARY EMBOLISM (ICD-415.19) Stable on anti-coagulation therapy.  His updated medication list for this problem includes:    Coumadin 5 Mg Tabs (  Warfarin sodium) .Marland Kitchen... Take as directed by coumadin clinic.    Aspirin 81 Mg Tabs (Aspirin) .Marland Kitchen... Take one tablet by mouth every other day  Problem # 7:  CARDIOMYOPATHY (ICD-425.4) Asymptomatic with no evidence of fluid overload.  Plan - Assessment of EF by 2 D echo is scheduled  Problem # 8:  PERIPHERAL VASCULAR DISEASE (ICD-443.9) No complaints of claudication or other symptoms.  Problem # 9:  HYPERLIPIDEMIA (ICD-272.4) For lab with recommendations to follow.  His updated medication list for this problem includes:    Atorvastatin Calcium 80 Mg Tabs (Atorvastatin calcium) .Marland Kitchen... Take 1 tab by mouth at bedtime  Orders: TLB-Lipid Panel (80061-LIPID) TLB-Hepatic/Liver Function Pnl (80076-HEPATIC) TLB-TSH (Thyroid Stimulating Hormone) (84443-TSH)  Addendum - LDL 98 - above goal of 80 or less for high risk patient.  Plan - may benefit from additional therapy: zetia vs Welchol. Will defer to Dr. [name].  Problem # 10:  Preventive Health  Care (ICD-V70.0) Interval historyis without new events or ailments. Physical exam notable for hernia and obesity. Lab results are within normal limits except for hemoglobin and hematocrit. Prostate health - slightly elevated PSA at 4.7 - recommend follow-up PSA in 6 months. Screening for colorectal cancer - patient reports he has had colonoscopy. He is asked to have report forwarded to [place]. Immuniations: tetnus-up to date; pneumonia vaccine given today; he has had shingles - immunization not necessary.   In summary - a pleasant man with multiple co-morbidities. He is safe for surgical repair of hernia if needed. Will follow-up on elevated hemoglobin and elevated PSA. He will have scheduled studies 3/7. He will be seen in follow-up depending on his studies and repeat labs.  Complete Medication List: 1)  Amlodipine Besylate 5 Mg Tabs (Amlodipine besylate) .Marland Kitchen.. 1 by mouth once daily 2)  Isosorbide Mononitrate Cr 30 Mg Tb24 (Isosorbide mononitrate) .... Take 1 tablet by mouth once a day 3)  Losartan Potassium-hctz 50-12.5 Mg Tabs (Losartan potassium-hctz) .Marland Kitchen.. 1 by mouth once daily 4)  Atorvastatin Calcium 80 Mg Tabs (Atorvastatin calcium) .... Take 1 tab by mouth at bedtime 5)  Coumadin 5 Mg Tabs (Warfarin sodium) .... Take as directed by coumadin clinic. 6)  Aspirin 81 Mg Tabs (Aspirin) .... Take one tablet by mouth every other day  Other Orders: Pneumococcal Vaccine (21308) Admin 1st Vaccine (65784) TLB-CBC Platelet - w/Differential (85025-CBCD) TLB-PSA (Prostate Specific Antigen) (84153-PSA)  ient: Chris Reyes Note: All result statuses are Final unless otherwise noted.  Tests: (1) BMP (METABOL)   Sodium                    136 mEq/L                   135-145   Potassium                 4.6 mEq/L                   3.5-5.1   Chloride                  100 mEq/L                   96-112   Carbon Dioxide            30 mEq/L                    19-32   Glucose  92 mg/dL                     04-54   BUN                       9 mg/dL                     0-98   Creatinine                0.9 mg/dL                   1.1-9.1   Calcium                   9.1 mg/dL                   4.7-82.9   GFR                       91.30 mL/min                >60.00  Tests: (2) Lipid Panel (LIPID)   Cholesterol               157 mg/dL                   5-621     ATP III Classification            Desirable:  < 200 mg/dL                    Borderline High:  200 - 239 mg/dL               High:  > = 240 mg/dL   Triglycerides             144.0 mg/dL                 3.0-865.7     Normal:  <150 mg/dL     Borderline High:  846 - 199 mg/dL   HDL                  [L]  96.29 mg/dL                 >52.84   VLDL Cholesterol          28.8 mg/dL                  1.3-24.4   LDL Cholesterol           98 mg/dL                    0-10  CHO/HDL Ratio:  CHD Risk                             5                    Men          Women     1/2 Average Risk     3.4          3.3     Average Risk          5.0          4.4     2X Average Risk  9.6          7.1     3X Average Risk          15.0          11.0                           Tests: (3) Hepatic/Liver Function Panel (HEPATIC)   Total Bilirubin           0.8 mg/dL                   1.6-1.0   Direct Bilirubin          0.2 mg/dL                   9.6-0.4   Alkaline Phosphatase      58 U/L                      39-117   AST                       26 U/L                      0-37   ALT                       27 U/L                      0-53   Total Protein             7.2 g/dL                    5.4-0.9   Albumin                   3.7 g/dL                    8.1-1.9  Tests: (4) CBC Platelet w/Diff (CBCD)   White Cell Count     [H]  13.0 K/uL                   4.5-10.5   Red Cell Count            5.50 Mil/uL                 4.22-5.81   Hemoglobin           [H]                              13.0-17.0       RESULT: 18.3 Critical result called to Ami on  11/06/2010 3:26 PM by Scales, Hope. Results were read back to caller. g/dL   Hematocrit           [H]  52.2 %                      39.0-52.0   MCV                       95.0 fl                     78.0-100.0   MCHC  35.1 g/dL                   16.1-09.6   RDW                       13.3 %                      11.5-14.6   Platelet Count            233.0 K/uL                  150.0-400.0   Neutrophil %              72.3 %                      43.0-77.0   Lymphocyte %              19.1 %                      12.0-46.0   Monocyte %                7.2 %                       3.0-12.0   Eosinophils%              1.0 %                       0.0-5.0   Basophils %               0.4 %                       0.0-3.0   Neutrophill Absolute [H]  9.4 K/uL                    1.4-7.7   Lymphocyte Absolute       2.5 K/uL                    0.7-4.0   Monocyte Absolute         0.9 K/uL                    0.1-1.0  Eosinophils, Absolute                             0.1 K/uL                    0.0-0.7   Basophils Absolute        0.1 K/uL                    0.0-0.1  Tests: (5) TSH (TSH)   FastTSH                   1.60 uIU/mL                 0.35-5.50  Tests: (6) Prostate Specific Antigen (PSA)   PSA-Hyb              [H]  4.70 ng/mL                  0.10-4.00Prescriptions: ATORVASTATIN CALCIUM 80 MG TABS (ATORVASTATIN CALCIUM) Take 1 tab by mouth at bedtime  #  30 x 12   Entered and Authorized by:   Jacques Navy MD   Signed by:   Jacques Navy MD on 11/06/2010   Method used:   Electronically to        [facility]  [phone]* (retail)       [address] [place], [place]  [address]       Ph: [phone] or [phone]       Fax: [phone]   RxID:   [id number] LOSARTAN POTASSIUM-HCTZ 50-12.5 MG TABS (LOSARTAN POTASSIUM-HCTZ) 1 by mouth once daily  #30 x 12   Entered and Authorized by:   Jacques Navy MD   Signed by:   Jacques Navy MD on 11/06/2010   Method  used:   Electronically to        [facility]  [phone]* (retail)       [address] [place], [place]  [address]       Ph: [phone] or [phone]       Fax: [phone]   RxID:   [id number] ISOSORBIDE MONONITRATE CR 30 MG TB24 (ISOSORBIDE MONONITRATE) Take 1 tablet by mouth once a day  #30 Tablet x 12   Entered and Authorized by:   Jacques Navy MD   Signed by:   Jacques Navy MD on 11/06/2010   Method used:   Electronically to        [facility]  [phone]* (retail)       [address] [place], [place]  [address]       Ph: [phone] or [phone]       Fax: [phone]   RxID:   [id number] AMLODIPINE BESYLATE 5 MG TABS (AMLODIPINE BESYLATE) 1 by mouth once daily  #30 Tablet x 12   Entered and Authorized by:   Jacques Navy MD   Signed by:   Jacques Navy MD on 11/06/2010   Method used:   Electronically to        [facility]  [phone]* (retail)       [address] [place], [place]  [address]       Ph: [phone] or [phone]       Fax: [phone]   RxID:   [id number]    Orders Added: 1)  Pneumococcal Vaccine [90732] 2)  Admin 1st Vaccine [90471] 3)  Est. Patient Level IV [99833] 4)  TLB-BMP (Basic Metabolic Panel-BMET) [80048-METABOL] 5)  TLB-Lipid Panel [80061-LIPID] 6)  TLB-Hepatic/Liver Function Pnl [80076-HEPATIC] 7)  TLB-CBC Platelet - w/Differential [85025-CBCD] 8)  TLB-TSH (Thyroid Stimulating Hormone) [84443-TSH] 9)  TLB-PSA (Prostate Specific Antigen) [82505-LZJ] 10)  Surgical Referral [Surgery]   Immunizations Administered:  Pneumonia Vaccine:    Vaccine Type: Pneumovax    Site: left deltoid    Mfr: Merck    Dose: 0.5 ml    Route: IM    Given by: Ami Bullins CMA    Exp. Date: 03/28/2012    Lot #: [id number]    VIS given: 08/07/09 version given November 06, 2010.   Immunizations Administered:  Pneumonia Vaccine:    Vaccine Type: Pneumovax    Site: left deltoid    Mfr: Merck     Dose: 0.5 ml    Route: IM    Given by: Ami Bullins CMA    Exp. Date: 03/28/2012  Lot #: [id number]    VIS given: 08/07/09 version given November 06, 2010.

## 2010-11-15 ENCOUNTER — Ambulatory Visit: Payer: Self-pay | Admitting: Cardiology

## 2010-11-18 ENCOUNTER — Encounter: Payer: Self-pay | Admitting: Cardiology

## 2010-11-19 ENCOUNTER — Encounter: Payer: Self-pay | Admitting: Cardiology

## 2010-11-19 ENCOUNTER — Ambulatory Visit (INDEPENDENT_AMBULATORY_CARE_PROVIDER_SITE_OTHER): Payer: Medicare Other | Admitting: Cardiology

## 2010-11-19 ENCOUNTER — Other Ambulatory Visit (INDEPENDENT_AMBULATORY_CARE_PROVIDER_SITE_OTHER): Payer: Medicare Other

## 2010-11-19 ENCOUNTER — Encounter (INDEPENDENT_AMBULATORY_CARE_PROVIDER_SITE_OTHER): Payer: Medicare Other

## 2010-11-19 ENCOUNTER — Other Ambulatory Visit: Payer: Self-pay | Admitting: Cardiology

## 2010-11-19 DIAGNOSIS — I2699 Other pulmonary embolism without acute cor pulmonale: Secondary | ICD-10-CM

## 2010-11-19 DIAGNOSIS — E785 Hyperlipidemia, unspecified: Secondary | ICD-10-CM

## 2010-11-19 DIAGNOSIS — I359 Nonrheumatic aortic valve disorder, unspecified: Secondary | ICD-10-CM

## 2010-11-19 DIAGNOSIS — I2510 Atherosclerotic heart disease of native coronary artery without angina pectoris: Secondary | ICD-10-CM

## 2010-11-19 DIAGNOSIS — Z7901 Long term (current) use of anticoagulants: Secondary | ICD-10-CM

## 2010-11-19 LAB — HEPATIC FUNCTION PANEL
Albumin: 3.6 g/dL (ref 3.5–5.2)
Alkaline Phosphatase: 53 U/L (ref 39–117)

## 2010-11-19 LAB — LIPID PANEL
Cholesterol: 193 mg/dL (ref 0–200)
HDL: 33.2 mg/dL — ABNORMAL LOW (ref >39.00)
VLDL: 36.8 mg/dL (ref 0.0–40.0)

## 2010-11-29 NOTE — Assessment & Plan Note (Signed)
Summary: [id number] PER CHECK OUT/SAF      Allergies Added: NKDA  Visit Type:  Follow-up Primary Provider:  Jacques Navy MD  CC:  aortic stenosis.  History of Present Illness: Patient is seen for followup of coronary artery disease and aortic stenosis.  I had seen him September 25, 2010.  At that time decision was made to obtain a carotid Doppler because of the bruits in his neck and to proceed with followup two-dimensional echo.  Carotid Doppler reveals only mild disease.  This can be followed over time.  His echo reveals that his aortic stenosis has progressed since 2009.  It is now moderately severe.  We will need to do a followup echo in one year.  He is not symptomatic from this at this time.  Current Medications (verified): 1)  Amlodipine Besylate 5 Mg Tabs (Amlodipine Besylate) .Marland Kitchen.. 1 By Mouth Once Daily 2)  Isosorbide Mononitrate Cr 30 Mg Tb24 (Isosorbide Mononitrate) .... Take 1 Tablet By Mouth Once A Day 3)  Losartan Potassium-Hctz 50-12.5 Mg Tabs (Losartan Potassium-Hctz) .Marland Kitchen.. 1 By Mouth Once Daily 4)  Atorvastatin Calcium 80 Mg Tabs (Atorvastatin Calcium) .... Take 1 Tab By Mouth At Bedtime 5)  Coumadin 5 Mg Tabs (Warfarin Sodium) .... Take As Directed By Coumadin Clinic. 6)  Aspirin 81 Mg Tabs (Aspirin) .... Take One Tablet By Mouth Every Other Day  Allergies (verified): No Known Drug Allergies  Past History:  Past Medical History: CAD    catheterization in [place] July, 2006, EF 45%, 50% mid LAD, 50-60% RCA, 90% marginal, possible collateral from the LAD to a branch of the posterior descending,   medical therapy GI bleed    2006...no significant abnormalities per patient dyslipidemia PAD EF 45%, catheterization 2006  /  EF 60%... echo ( technically limited).. February of 2009, diastolic dysfunction /  EF 55%...echo...11/07/2010...wall abnormality base inferior wall Aortic stenosis    moderate.... echo... February, 2009... cannot rule out bicuspid aortic valve  /    moderately severe...echo...11/07/2010 (progression since 2009) Rule out bicuspid aortic valve COPD Hypertension Lumbar disc surgery and gallbladder surgery. IRBBB Pulmonary emboli    significant... August, 2007.... Coumadin therapy.. Coumadin therapy bradycardia Tobacco abuse Carotid artery disease     mild...doppler.Marland KitchenMarland Kitchen3/2012...0-39%  bilateral  Review of Systems       Patient denies fever, chills, headache, sweats, rash, change in vision, change in hearing, chest pain, cough, nausea vomiting, urinary symptoms.  All other systems are reviewed and are negative.  Vital Signs:  Patient profile:   59 year old male Height:      71 inches Weight:      294 pounds BMI:     41.15 Pulse rate:   75 / minute BP sitting:   122 / 74  (left arm) Cuff size:   large  Vitals Entered By: Hardin Negus, RMA (November 19, 2010 8:59 AM)  Physical Exam  General:  patient is overweight but stable. Eyes:  no xanthelasma. Neck:  no jugular distention. Lungs:  lungs are clear.  Respiratory effort is nonlabored. Heart:  cardiac exam reveals an S1-S2.  There is a 3/6 crescendo decrescendo murmur of aortic stenosis. Abdomen:  abdomen is protuberant but soft. Extremities:  no peripheral edema. Psych:  patient is oriented to person time and place.  Affect is normal.   Impression & Recommendations:  Problem # 1:  TOBACCO ABUSE (ICD-305.1) Patient continues to smoke.  He is again counseled to stop.  Problem # 2:  COUMADIN THERAPY (ICD-V58.61) Patient  continues on Coumadin for his history of pulmonary emboli.  Problem # 3:  AORTIC STENOSIS (ICD-424.1) As outlined in the history of present illness, aortic stenosis now moderately severe.  I will need to see him back in one year and we will need a followup 2-D echo in one year.  Problem # 4:  CORONARY ARTERY DISEASE (ICD-414.00) Coronary disease is stable.  Followup echo was done showing that his ejection fraction is 55%.  There is a wall motion  abnormality in the inferior wall.  Is not having any significant symptoms.  When I see him next year we will consider whether exercise testing is appropriate.  Patient Instructions: 1)  Your physician has requested that you have an echocardiogram.  Echocardiography is a painless test that uses sound waves to create images of your heart. It provides your doctor with information about the size and shape of your heart and how well your heart's chambers and valves are working.  This procedure takes approximately one hour. There are no restrictions for this procedure.  NEEDS IN 1 YEAR. 2)  Your physician wants you to follow-up in:  1 year.  You will receive a reminder letter in the mail two months in advance. If you don't receive a letter, please call our office to schedule the follow-up appointment.

## 2010-11-29 NOTE — Miscellaneous (Signed)
  Clinical Lists Changes  Observations: Added new observation of PAST MED HX: CAD    catheterization in [place] July, 2006, EF 45%, 50% mid LAD, 50-60% RCA, 90% marginal, possible collateral from the LAD to a branch of the posterior descending,   medical therapy GI bleed    2006...no significant abnormalities per patient dyslipidemia PAD EF 45%, catheterization 2006  /  EF 60%... echo ( technically limited).. February of 2009, diastolic dysfunction /  EF 55%...echo...11/07/2010...wall abnormality base inferior wall Aortic stenosis    moderate.... echo... February, 2009... cannot rule out bicuspid aortic valve  /   moderately severe...echo...11/07/2010 (progression since 2009) Rule out bicuspid aortic valve COPD Hypertension Lumbar disc surgery and gallbladder surgery IRBBB Pulmonary emboli    significant... August, 2007.... Coumadin therapy.. Coumadin therapy bradycardia Tobacco abuse Carotid artery disease     mild...doppler.Marland KitchenMarland Kitchen3/2012...0-39%  bilateral (11/18/2010 16:08) Added new observation of PRIMARY MD: Jacques Navy MD (11/18/2010 16:08)       Past History:  Past Medical History: CAD    catheterization in [place] July, 2006, EF 45%, 50% mid LAD, 50-60% RCA, 90% marginal, possible collateral from the LAD to a branch of the posterior descending,   medical therapy GI bleed    2006...no significant abnormalities per patient dyslipidemia PAD EF 45%, catheterization 2006  /  EF 60%... echo ( technically limited).. February of 2009, diastolic dysfunction /  EF 55%...echo...11/07/2010...wall abnormality base inferior wall Aortic stenosis    moderate.... echo... February, 2009... cannot rule out bicuspid aortic valve  /   moderately severe...echo...11/07/2010 (progression since 2009) Rule out bicuspid aortic valve COPD Hypertension Lumbar disc surgery and gallbladder surgery IRBBB Pulmonary emboli    significant... August, 2007.... Coumadin therapy.. Coumadin  therapy bradycardia Tobacco abuse Carotid artery disease     mild...doppler.Marland KitchenMarland Kitchen3/2012...0-39%  bilateral

## 2010-11-29 NOTE — Medication Information (Signed)
Summary: rov/sp  Anticoagulant Therapy  Managed by: Windell Hummingbird, RN Referring MD: Willa Rough MD PCP: Jacques Navy MD Supervising MD: Jens Som MD, Arlys John Indication 1: Pulmonary Embolism and Infarction (ICD-415.1) Lab Used: [facility] Site: [facility] INR POC 2.7 INR RANGE 2 - 3  Dietary changes: no    Health status changes: no    Bleeding/hemorrhagic complications: no    Recent/future hospitalizations: no    Any changes in medication regimen? no    Recent/future dental: no  Any missed doses?: no       Is patient compliant with meds? yes       Allergies: No Known Drug Allergies  Anticoagulation Management History:      The patient is taking warfarin and comes in today for a routine follow up visit.  Negative risk factors for bleeding include an age less than 31 years old.  The bleeding index is 'low risk'.  Positive CHADS2 values include History of HTN.  Negative CHADS2 values include Age > 60 years old.  The start date was 04/23/2006.  Anticoagulation responsible provider: Jens Som MD, Arlys John.  INR POC: 2.7.  Cuvette Lot#: [id number].  Exp: 11/2011.    Anticoagulation Management Assessment/Plan:      The patient's current anticoagulation dose is Coumadin 5 mg tabs: Take as directed by coumadin clinic..  The target INR is 2.0-3.0.  The next INR is due 12/17/2010.  Anticoagulation instructions were given to patient.  Results were reviewed/authorized by Windell Hummingbird, RN.  He was notified by Windell Hummingbird, RN.         Prior Anticoagulation Instructions: INR 2.0  Restart previous dose of 1 tablet every day except 1/2 tablet on Monday and Friday.  Recheck INR in 3 weeks.   Current Anticoagulation Instructions: INR 2.7 Continue taking 1 tablet every day, except take 1/2 tablet on Mondays and Fridays. Recheck in 4 weeks.

## 2010-12-17 ENCOUNTER — Ambulatory Visit (INDEPENDENT_AMBULATORY_CARE_PROVIDER_SITE_OTHER): Payer: Medicare Other | Admitting: *Deleted

## 2010-12-17 DIAGNOSIS — I2699 Other pulmonary embolism without acute cor pulmonale: Secondary | ICD-10-CM

## 2010-12-29 ENCOUNTER — Encounter (INDEPENDENT_AMBULATORY_CARE_PROVIDER_SITE_OTHER): Payer: Self-pay | Admitting: General Surgery

## 2011-01-03 ENCOUNTER — Other Ambulatory Visit: Payer: Self-pay | Admitting: Internal Medicine

## 2011-01-08 ENCOUNTER — Ambulatory Visit (INDEPENDENT_AMBULATORY_CARE_PROVIDER_SITE_OTHER): Payer: Medicare Other | Admitting: *Deleted

## 2011-01-08 DIAGNOSIS — I2699 Other pulmonary embolism without acute cor pulmonale: Secondary | ICD-10-CM

## 2011-01-18 NOTE — Discharge Summary (Signed)
NAME:  Chris Reyes, Chris Reyes                ACCOUNT NO.:  [id number]   MEDICAL RECORD NO.:  [id number]          PATIENT TYPE:  INP   LOCATION:  4705                         FACILITY:  [facility]   PHYSICIAN:  Chris Gess. Norins, MD  DATE OF BIRTH:  [birth year]   DATE OF ADMISSION:  04/25/2006  DATE OF DISCHARGE:  04/29/2006                                 DISCHARGE SUMMARY   ADMITTING DIAGNOSES:  1. Shortness of breath with PPD by CT protocol.  2. Hypertension.  3. Constipation.   DISCHARGE DIAGNOSES:  1. Shortness of breath with PPD by CT protocol.  2. Hypertension.  3. Constipation.   CONSULTANTS:  Luis Abed, MD, [facility] of cardiology service.   PROCEDURES:  CT scan of the chest with PE protocol that was positive for  multiple pulmonary emboli.   HISTORY OF PRESENT ILLNESS:  The patient is 60 year old gentleman who had  taken an 11-hour to [place] and back.  He only stopped to fill with gas.  The patient then developed a week-long history of intermittent sharp chest  pain.  He continued to take his blood pressure medicine and felt his blood  pressure was lower than usual.  He also had 1 week of constipation.  Because  of his chest discomfort, he was seen by Dr. Romero Belling.  A CT scan  performed which was positive for pulmonary emboli, and he subsequently  admitted for anticoagulation.   Please see the H&P for past medical history, family history, social history  and medications.   ADMISSION PHYSICAL EXAMINATION:  VITAL SIGNS:  At admission significant for  blood pressure 103/68, heart rate 68, temperature was 99.  CARDIOVASCULAR:  Unremarkable.  CHEST:  Clear to auscultation and percussion.   HOSPITAL COURSE:  The patient was admitted to the hospital.  He was started  on heparin therapy and Coumadin followed by pharmacy.  The patient had no  recurrent chest pain, chest discomfort, or other significant symptoms.  The  patient had a good response to Coumadin therapy and on the  day of discharge  his INR was up to 2.3.  It was felt the patient at this point was  therapeutic and come off of heparin and could be discharged home on Coumadin  with followup in the [facility] at [facility].   DISCHARGE PHYSICAL EXAMINATION:  VITAL SIGNS:  Temperature of 98.1, blood  pressure 103/66, heart rate 63, respirations 20, O2 sat is 93% on room air.  GENERAL APPEARANCE:  This is a heavy-set Caucasian male in no acute  distress.  HEENT:  Unremarkable except for loose upper tooth.  PULMONARY:  The patient is moving air well.  No wheezes were appreciated.  CARDIOVASCULAR:  With 2+ radial pulses.  Precordium was quiet.   FINAL LABORATORY:  Last INR, August 28, was 2.3.  Final CBC, August 27, with  a white count 11,200, hemoglobin 15 grams.  Chemistries at admission were  unremarkable, except for a sodium of 133.  TSH was normal at 1.36.  Urinalysis was negative.   DISCHARGE MEDICATIONS:  The patient will resume all his  home medications  including:  1. Isosorbide mononitrate 30 mg daily.  2. Atenolol 100 mg daily.  3. Lisinopril/HCTZ 20/12.5 mg daily.  4. Zocor 40 mg daily.  5. Pletal 100 mg will be discontinued because of blood thinners.  6. The patient will be continued on Coumadin at 5 mg daily.   DISPOSITION:  The patient is discharged home.   He may resume all his normal activities.   He will be called and given an appointment at the [facility] for Thursday May 01, 2006.   The patient's condition at time of discharge dictation is stable and  improved.           ______________________________  Chris Gess Norins, MD     MEN/MEDQ  D:  04/29/2006  T:  04/29/2006  Job:  [id number]   cc:   Luis Abed, MD, [facility]

## 2011-01-18 NOTE — H&P (Signed)
NAME:  Chris Reyes, Chris Reyes                ACCOUNT NO.:  [id number]   MEDICAL RECORD NO.:  [id number]          PATIENT TYPE:  INP   LOCATION:  4705                         FACILITY:  [facility]   PHYSICIAN:  Sean A. Everardo All, MD    DATE OF BIRTH:  [birth year]   DATE OF ADMISSION:  04/25/2006  DATE OF DISCHARGE:                                HISTORY & PHYSICAL   REASON FOR ADMISSION:  Pulmonary emboli.   HISTORY OF PRESENT ILLNESS:  A 60 year old man with one week if intermittent  chest pain in the context of deep breathing.  He states that the pain only  lasts about a second at a time and he states that if he did not have to take  a deep breath, he would not have any pain.  Regarding his hypertension, he  takes his medications as prescribed and feels that his blood pressure is  lower than it usually runs on his medications.  He also has one week of  constipation, and he feels that his chest pain is making it more difficult  for him to move his bowels.   PAST MEDICAL HISTORY:  1. CAD.  2. Dyslipidemia.  3. Peripheral arterial disease.  4. Several back surgeries, 15-20 years ago.  5. Gallbladder removed 10 years ago.  6. Cigarette smoker, and he quit just a few days ago.   MEDICATIONS:  1. Isosorbide mononitrate 30 mg daily.  2. Atenolol 100 mg daily.  3. Lisinopril/HCTZ 20/12.5, one daily.  4. Pletal 100 mg daily.  5. Zocor 40 mg a day.   SOCIAL HISTORY:  Disabled.  He is here today with his fiance.   FAMILY HISTORY:  Negative for pulmonary emboli.   REVIEW OF SYSTEMS:  He has shortness of breath only with chest pain.  He  denies the following:  Fever, weight gain, weight loss, loss of  consciousness, rectal bleeding, hematuria, painful urination, skin rash,  easy bruising, visual loss, and headache.   PHYSICAL EXAMINATION:  VITAL SIGNS:  Blood pressure 103/68, heart rate 68,  temperature is 99, the weight is  272.  GENERAL:  No distress.  SKIN:  Not diaphoretic.  HEENT:  No  proptosis.  No periorbital swelling.  Pharynx is normal.  Head:  No evidence of heard trauma to external examination.  NECK:  Supple.  CHEST:  Clear to auscultation.  There is point tenderness in the left  lateral lower chest wall.  CARDIOVASCULAR:  No JVD.  No edema.  Regular rate and rhythm.  No murmur.  Pedal pulses are intact.  ABDOMEN/GENITAL/RECTAL:  Not done at this time due to patient condition.  EXTREMITIES:  No deformity is seen.  NEUROLOGIC:  Alert, oriented.  Cranial nerves appear to be intact  bilaterally and gait is observed in the office to be normal.   LABORATORY STUDIES:  Chest x-ray shows a left pleural effusion.  STAT CT  scan of the chest shows multiple pulmonary emboli.   IMPRESSION:  1. Pulmonary emboli, etiology uncertain.  2. His hypertension is over controlled.  It is uncertain to what extent,  if any, the pulmonary emboli are contributing to lowering of his blood      pressure.  3. Constipation due to the pain he is in.   PLAN:  1. Admit to telemetry.  2. Full dose heparin.  3. PPI therapy.  4. Symptomatic therapy for his pain.  5. We will hold the isosorbide and Zestoretic for now.  6. Full code for now.  7. We will also give symptomatic therapy for his constipation.           ______________________________  Cleophas Dunker Everardo All, MD     SAE/MEDQ  D:  04/25/2006  T:  04/25/2006  Job:  [id number]   cc:   Rosalyn Gess. Norins, MD

## 2011-01-18 NOTE — Consult Note (Signed)
NAME:  Chris Reyes, Chris Reyes                ACCOUNT NO.:  [id number]   MEDICAL RECORD NO.:  [id number]          PATIENT TYPE:  INP   LOCATION:  4705                         FACILITY:  [facility]   PHYSICIAN:  Rollene Rotunda, MD, FACCDATE OF BIRTH:  [birth year]   DATE OF CONSULTATION:  04/26/2006  DATE OF DISCHARGE:                                   CONSULTATION   REFERRING PHYSICIAN:  Sean A. Everardo All, M.D.   REASON FOR CONSULTATION:  Manage medications in patient with known coronary  disease.   HISTORY OF PRESENT ILLNESS:  The patient is a pleasant 60 year old gentleman  admitted with chest pain and found to have pulmonary emboli bilaterally.   The patient has a history of coronary disease and has seen Dr. [name].  Catheterization in [place] and was managed medically (I do not have the  details of this).  Also reports peripheral vascular disease.  He has not had  any recent cardiovascular symptoms by his report.  He has not had any chest  discomfort, neck or arm discomfort until the recent episodes.  He has had no  palpitations, presyncope, or syncope.  No PND or orthopnea.  On Friday he  developed acute shortness of breath and chest discomfort.  The pain is sharp  and unlike any previous angina.  It is increased with inspiration.  It was  around his entire chest.  No neck, arm discomfort.  He was so short of  breath but no nausea, vomiting.  He has not noticed any palpitations,  presyncope or syncope.   PAST MEDICAL HISTORY:  Coronary artery disease (details unclear).  Peripheral vascular disease, dyslipidemia, hypertension.   PAST SURGICAL HISTORY:  Back surgeries, cholecystectomy.   ALLERGIES:  He denies any allergies.   MEDICATIONS:  (Prior to admission)  1. Isosorbide 30 mg daily.  2. Atenolol 100 mg daily.  3. Lisinopril hydrocortisone 20/12.5 daily.  4. Pletal 100 mg daily.  5. Zocor 40 mg daily.   SOCIAL HISTORY:  The patient is disabled from his back pain.  He is divorced  x2 and has a fiancee.  He just quit smoking recently.   FAMILY HISTORY:  Is contributory for early coronary artery disease.   REVIEW OF SYSTEMS:  As stated in HPI, otherwise negative for other systems.   PHYSICAL EXAMINATION:  GENERAL:  The patient is in mild distress with  inspiratory chest pain.  VITAL SIGNS:  Blood pressure 110/62, heart rate 63 and regular, weight 97.8.  Her temperature is 97.8, oxygen saturation 98% on 2 liters.  HEENT:  Eyes unremarkable, pupils equal, round, and reactive to light and  fundi not visualized.  NECK:  No jugular venous distention, transmitted systolic murmur, no  thyromegaly.  LYMPHATICS:  No adenopathy.  LUNGS:  Decreased breath sounds without wheezing or crackles.  BACK:  No costovertebral angle tenderness.  CHEST:  Unremarkable.  HEART:  PMI not displaced or sustained, S1 and S2 within normal limits, no  S3, no S4, 3/6 apical systolic murmur radiating up the aortic outflow tract,  no diastolic murmurs.  ABDOMEN:  Obese, positive bowel sounds, normal in  frequency and pitch, no  bruits, no rebound, no guarding, no midline pulsatile mass or  hepatosplenomegaly.  SKIN:  No rash or nodules.  EXTREMITIES:  2+ pulses, no edema.   EKG none ordered that I can find.   LABORATORY DATA:  WBC 14.6, hemoglobin 16, platelets 170, BUN 15, creatinine  1.3, sodium 133, potassium 4.4.   ASSESSMENT:  Coronary disease.  The patient has coronary disease with no  obvious active ischemia.  He had not had any recent symptoms.  At this point  he can continue medications as listed.  I agree with Atenolol as b.i.d.  drug.  We will hold his lisinopril for now because he is slightly  hypotensive with his pulmonary emboli.  He can continue his Zocor.  He is  restarting his Imdur.  1. Pulmonary embolism per Dr. Everardo All.  2. Peripheral vascular disease.  We can hold off on Pletal until      discharge.           ______________________________  Rollene Rotunda, MD,  [facility]     JH/MEDQ  D:  04/26/2006  T:  04/28/2006  Job:  [id number]

## 2011-01-18 NOTE — Assessment & Plan Note (Signed)
[facility]                            CARDIOLOGY OFFICE NOTE   NAME:Chris Reyes, Chris Reyes                       MRN:          [id number]  DATE:11/25/2006                            DOB:          Aug 26, 1951    I saw Mr. Mangino last in February of 2007.  He was hospitalized at [facility] in August of 2007.  At that time, chest CT scan revealed  multiple pulmonary emboli and he was treated with anticoagulation.  He  has been coumadinized ever since.  He stopped smoking.  He has gained a  lot of weight.  With this, he has had some inactivity and not felt as  well.  He has not had any significant chest pain.  He has some  discomfort in his hip and his right leg.  This comes on with walking,  but then disappears, and he can walk through it and this seems not  compatible with claudication.   The patient has known coronary artery disease.  He also has some  peripheral vascular disease, but we do not have the information on this.  It is my understanding that this was assessed in October of 2006 in  [place].  He maintains a home in [place], although, he lives  here most of the time.   ALLERGIES:  No known drug allergies.   MEDICATIONS:  1. Isosorbide 30.  2. Atenolol 100.  3. Lisinopril hydrochlorothiazide 20/12.5.  4. Zocor 40.  5. Coumadin as directed through the [facility]madin Clinic.   PAST MEDICAL HISTORY:  See the list below.   REVIEW OF SYSTEMS:  See the HPI.   PHYSICAL EXAMINATION:  VITAL SIGNS:  Weight 290, blood pressure 127/77  with pulse of 64.  GENERAL:  The patient is oriented to person, time, and place.  Affect is  normal.  He has no xanthelasma.  HEENT:  He has normal extraocular movements.  There are no carotid  bruits.  There is no jugular venous distention.  HEART:  S1 with S2.  He has a 2/6 crescendo de crescendo systolic  murmur.  ABDOMEN:  Significantly distended and obese, but it is not firm and  nontender.  He has no  significant peripheral edema.   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   PROBLEM:  1. Coronary artery disease with anatomy described in my note of      February 2007.  2. Chronic obstructive pulmonary disease.  3. Hypertension.  4. Hypercholesterolemia.  5. Some peripheral vascular disease.  I considered repeating studies,      but have decided not to at this time.  6. Decreased ejection fraction by history in the range of 45%.  7. Biatrial enlargement.  8. Mild aortic stenosis.  In another year, he will need another two-      dimensional echocardiogram.  9. Abnormal diastolic function.  10.History of back disk surgery.  11.History of gallbladder surgery.  12.History of a very slight gastrointestinal bleed in August of 2006.  13.Incomplete right bundle branch block on electrocardiogram.  14.Some leg and hip pain which  I believe is not claudication at this      time.  15.History of significant pulmonary emboli in August of 2007 with      Coumadin therapy.  16.Coumadin therapy.   No change in his cardiac medicines.  There is question of his  cholesterol, but we do not find the results in the fall of 2007.  We  will renew his Zocor and Simvastatin and he will follow up with Dr.  Debby Bud.     Luis Abed, MD, [facility]  Electronically Signed    JDK/MedQ  DD: 11/25/2006  DT: 11/25/2006  Job #: [id number]   cc:   Rosalyn Gess. Norins, MD

## 2011-02-05 ENCOUNTER — Ambulatory Visit (INDEPENDENT_AMBULATORY_CARE_PROVIDER_SITE_OTHER): Payer: Medicare Other | Admitting: *Deleted

## 2011-02-05 DIAGNOSIS — I2699 Other pulmonary embolism without acute cor pulmonale: Secondary | ICD-10-CM

## 2011-03-05 ENCOUNTER — Ambulatory Visit (INDEPENDENT_AMBULATORY_CARE_PROVIDER_SITE_OTHER): Payer: Medicare Other | Admitting: *Deleted

## 2011-03-05 DIAGNOSIS — I2699 Other pulmonary embolism without acute cor pulmonale: Secondary | ICD-10-CM

## 2011-03-05 LAB — POCT INR: INR: 2.1

## 2011-04-02 ENCOUNTER — Ambulatory Visit (INDEPENDENT_AMBULATORY_CARE_PROVIDER_SITE_OTHER): Payer: Medicare Other | Admitting: *Deleted

## 2011-04-02 DIAGNOSIS — I2699 Other pulmonary embolism without acute cor pulmonale: Secondary | ICD-10-CM

## 2011-04-02 LAB — POCT INR: INR: 2.6

## 2011-04-30 ENCOUNTER — Ambulatory Visit (INDEPENDENT_AMBULATORY_CARE_PROVIDER_SITE_OTHER): Payer: Medicare Other | Admitting: *Deleted

## 2011-04-30 DIAGNOSIS — I2699 Other pulmonary embolism without acute cor pulmonale: Secondary | ICD-10-CM

## 2011-04-30 LAB — POCT INR: INR: 2.8

## 2011-05-02 ENCOUNTER — Other Ambulatory Visit: Payer: Self-pay | Admitting: Internal Medicine

## 2011-05-08 ENCOUNTER — Encounter (INDEPENDENT_AMBULATORY_CARE_PROVIDER_SITE_OTHER): Payer: Self-pay | Admitting: General Surgery

## 2011-05-08 ENCOUNTER — Ambulatory Visit (INDEPENDENT_AMBULATORY_CARE_PROVIDER_SITE_OTHER): Payer: Medicare Other | Admitting: General Surgery

## 2011-05-08 VITALS — BP 144/98 | HR 60

## 2011-05-08 DIAGNOSIS — K429 Umbilical hernia without obstruction or gangrene: Secondary | ICD-10-CM

## 2011-05-08 NOTE — Progress Notes (Signed)
Subjective:     Patient ID: Chris Reyes, male   DOB: 11/23/1950, 60 y.o.   MRN: [id number]  HPI Patient is for followup of umbilical hernia. I saw him in April of this year. At that time his hernia was easily reducible without pain. Since that time, the patient's primary physician has been working on optimizing his medical status. He has had some success. The patient has noted no further worsening of his hernia size. He has had no pain from it. He is having no nausea or vomiting. He is moving his bowels well. He notes that the hernia reduces very easily when he lays down.  Review of Systems     Objective:   Physical Exam  Constitutional: He appears well-nourished. No distress.  HENT:  Head: Normocephalic.  Cardiovascular: Normal rate.   Murmur heard. Pulmonary/Chest: Effort normal. No respiratory distress. He has no wheezes. He has no rales.  Abdominal: Soft. He exhibits no mass. There is no tenderness. There is no rebound and no guarding.  Musculoskeletal: Normal range of motion. He exhibits edema.  The patient's hernia remained stable in size as previously, it easily reduces with no pain, there are no overlying skin changes     Assessment:       Umbilical hernia stable in size in a patient with significant cardiac and pulmonary risk factors Plan:     I would hold off on surgery at this point. The patient is also reluctant to have surgery as it is giving him significant symptoms. In light of his comorbidities, I would manage this expectantly. The patient is clearly informed of signs and symptoms of obstruction, strangulation, or any worsening of hernia symptoms and he has agreed to call us right away. We will see him back as needed.

## 2011-05-11 ENCOUNTER — Other Ambulatory Visit: Payer: Self-pay | Admitting: Internal Medicine

## 2011-05-11 DIAGNOSIS — R972 Elevated prostate specific antigen [PSA]: Secondary | ICD-10-CM

## 2011-05-13 ENCOUNTER — Other Ambulatory Visit: Payer: Medicare Other

## 2011-05-28 ENCOUNTER — Ambulatory Visit (INDEPENDENT_AMBULATORY_CARE_PROVIDER_SITE_OTHER): Payer: Medicare Other | Admitting: *Deleted

## 2011-05-28 DIAGNOSIS — I2699 Other pulmonary embolism without acute cor pulmonale: Secondary | ICD-10-CM

## 2011-05-28 LAB — POCT INR: INR: 3.5

## 2011-06-04 ENCOUNTER — Other Ambulatory Visit: Payer: Self-pay | Admitting: Internal Medicine

## 2011-06-18 ENCOUNTER — Ambulatory Visit (INDEPENDENT_AMBULATORY_CARE_PROVIDER_SITE_OTHER): Payer: Medicare Other | Admitting: *Deleted

## 2011-06-18 DIAGNOSIS — I2699 Other pulmonary embolism without acute cor pulmonale: Secondary | ICD-10-CM

## 2011-06-18 LAB — POCT INR: INR: 2.5

## 2011-07-16 ENCOUNTER — Ambulatory Visit (INDEPENDENT_AMBULATORY_CARE_PROVIDER_SITE_OTHER): Payer: Medicare Other | Admitting: *Deleted

## 2011-07-16 DIAGNOSIS — I2699 Other pulmonary embolism without acute cor pulmonale: Secondary | ICD-10-CM

## 2011-07-16 DIAGNOSIS — Z7901 Long term (current) use of anticoagulants: Secondary | ICD-10-CM

## 2011-08-13 ENCOUNTER — Ambulatory Visit (INDEPENDENT_AMBULATORY_CARE_PROVIDER_SITE_OTHER): Payer: Medicare Other | Admitting: *Deleted

## 2011-08-13 DIAGNOSIS — I2699 Other pulmonary embolism without acute cor pulmonale: Secondary | ICD-10-CM

## 2011-08-13 DIAGNOSIS — Z7901 Long term (current) use of anticoagulants: Secondary | ICD-10-CM

## 2011-09-13 ENCOUNTER — Ambulatory Visit (INDEPENDENT_AMBULATORY_CARE_PROVIDER_SITE_OTHER): Payer: Medicare Other | Admitting: *Deleted

## 2011-09-13 DIAGNOSIS — Z7901 Long term (current) use of anticoagulants: Secondary | ICD-10-CM

## 2011-09-13 DIAGNOSIS — I2699 Other pulmonary embolism without acute cor pulmonale: Secondary | ICD-10-CM

## 2011-10-18 ENCOUNTER — Ambulatory Visit (INDEPENDENT_AMBULATORY_CARE_PROVIDER_SITE_OTHER): Payer: Medicare Other | Admitting: *Deleted

## 2011-10-18 DIAGNOSIS — Z7901 Long term (current) use of anticoagulants: Secondary | ICD-10-CM

## 2011-10-18 DIAGNOSIS — I2699 Other pulmonary embolism without acute cor pulmonale: Secondary | ICD-10-CM

## 2011-11-07 ENCOUNTER — Other Ambulatory Visit: Payer: Self-pay | Admitting: Internal Medicine

## 2011-11-08 ENCOUNTER — Ambulatory Visit (INDEPENDENT_AMBULATORY_CARE_PROVIDER_SITE_OTHER): Payer: Medicare Other | Admitting: *Deleted

## 2011-11-08 DIAGNOSIS — I2699 Other pulmonary embolism without acute cor pulmonale: Secondary | ICD-10-CM

## 2011-11-08 DIAGNOSIS — Z7901 Long term (current) use of anticoagulants: Secondary | ICD-10-CM

## 2011-11-30 ENCOUNTER — Other Ambulatory Visit: Payer: Self-pay | Admitting: Internal Medicine

## 2011-12-03 ENCOUNTER — Ambulatory Visit (INDEPENDENT_AMBULATORY_CARE_PROVIDER_SITE_OTHER): Payer: Medicare Other

## 2011-12-03 DIAGNOSIS — I2699 Other pulmonary embolism without acute cor pulmonale: Secondary | ICD-10-CM

## 2011-12-03 DIAGNOSIS — Z7901 Long term (current) use of anticoagulants: Secondary | ICD-10-CM

## 2011-12-06 ENCOUNTER — Other Ambulatory Visit: Payer: Self-pay | Admitting: Internal Medicine

## 2011-12-24 ENCOUNTER — Ambulatory Visit (INDEPENDENT_AMBULATORY_CARE_PROVIDER_SITE_OTHER): Payer: Medicare Other | Admitting: Pharmacist

## 2011-12-24 DIAGNOSIS — Z7901 Long term (current) use of anticoagulants: Secondary | ICD-10-CM

## 2011-12-24 DIAGNOSIS — I2699 Other pulmonary embolism without acute cor pulmonale: Secondary | ICD-10-CM

## 2011-12-24 LAB — POCT INR: INR: 2.1

## 2012-01-14 ENCOUNTER — Ambulatory Visit (INDEPENDENT_AMBULATORY_CARE_PROVIDER_SITE_OTHER): Payer: Medicare Other | Admitting: Pharmacist

## 2012-01-14 DIAGNOSIS — Z7901 Long term (current) use of anticoagulants: Secondary | ICD-10-CM

## 2012-01-14 DIAGNOSIS — I2699 Other pulmonary embolism without acute cor pulmonale: Secondary | ICD-10-CM

## 2012-02-11 ENCOUNTER — Ambulatory Visit (INDEPENDENT_AMBULATORY_CARE_PROVIDER_SITE_OTHER): Payer: Medicare Other | Admitting: Pharmacist

## 2012-02-11 DIAGNOSIS — I2699 Other pulmonary embolism without acute cor pulmonale: Secondary | ICD-10-CM

## 2012-02-11 DIAGNOSIS — Z7901 Long term (current) use of anticoagulants: Secondary | ICD-10-CM

## 2012-02-11 LAB — POCT INR: INR: 2.4

## 2012-03-24 ENCOUNTER — Ambulatory Visit (INDEPENDENT_AMBULATORY_CARE_PROVIDER_SITE_OTHER): Payer: Medicare Other | Admitting: *Deleted

## 2012-03-24 DIAGNOSIS — Z7901 Long term (current) use of anticoagulants: Secondary | ICD-10-CM

## 2012-03-24 DIAGNOSIS — I2699 Other pulmonary embolism without acute cor pulmonale: Secondary | ICD-10-CM

## 2012-05-05 ENCOUNTER — Ambulatory Visit (INDEPENDENT_AMBULATORY_CARE_PROVIDER_SITE_OTHER): Payer: Medicare Other | Admitting: Pharmacist

## 2012-05-05 DIAGNOSIS — I2699 Other pulmonary embolism without acute cor pulmonale: Secondary | ICD-10-CM

## 2012-05-05 DIAGNOSIS — Z7901 Long term (current) use of anticoagulants: Secondary | ICD-10-CM

## 2012-05-05 LAB — POCT INR: INR: 2.5

## 2012-05-06 ENCOUNTER — Encounter: Payer: Self-pay | Admitting: General Practice

## 2012-05-29 ENCOUNTER — Ambulatory Visit: Payer: Medicare Other | Admitting: Cardiology

## 2012-06-02 ENCOUNTER — Other Ambulatory Visit: Payer: Self-pay | Admitting: Internal Medicine

## 2012-06-16 ENCOUNTER — Ambulatory Visit (INDEPENDENT_AMBULATORY_CARE_PROVIDER_SITE_OTHER): Payer: Medicare Other | Admitting: General Practice

## 2012-06-16 DIAGNOSIS — Z7901 Long term (current) use of anticoagulants: Secondary | ICD-10-CM

## 2012-06-16 DIAGNOSIS — I2699 Other pulmonary embolism without acute cor pulmonale: Secondary | ICD-10-CM

## 2012-06-16 LAB — POCT INR: INR: 3

## 2012-06-17 ENCOUNTER — Ambulatory Visit: Payer: Medicare Other | Admitting: Cardiology

## 2012-07-08 ENCOUNTER — Other Ambulatory Visit: Payer: Self-pay | Admitting: Internal Medicine

## 2012-07-21 ENCOUNTER — Ambulatory Visit (INDEPENDENT_AMBULATORY_CARE_PROVIDER_SITE_OTHER): Payer: Medicare Other | Admitting: General Practice

## 2012-07-21 DIAGNOSIS — Z7901 Long term (current) use of anticoagulants: Secondary | ICD-10-CM

## 2012-07-21 DIAGNOSIS — I2699 Other pulmonary embolism without acute cor pulmonale: Secondary | ICD-10-CM

## 2012-07-21 LAB — POCT INR: INR: 3.4

## 2012-07-26 ENCOUNTER — Encounter: Payer: Self-pay | Admitting: Cardiology

## 2012-07-26 DIAGNOSIS — I350 Nonrheumatic aortic (valve) stenosis: Secondary | ICD-10-CM | POA: Insufficient documentation

## 2012-07-26 DIAGNOSIS — I4510 Unspecified right bundle-branch block: Secondary | ICD-10-CM | POA: Insufficient documentation

## 2012-07-26 DIAGNOSIS — R9430 Abnormal result of cardiovascular function study, unspecified: Secondary | ICD-10-CM | POA: Insufficient documentation

## 2012-07-26 DIAGNOSIS — IMO0002 Reserved for concepts with insufficient information to code with codable children: Secondary | ICD-10-CM | POA: Insufficient documentation

## 2012-07-26 DIAGNOSIS — I2699 Other pulmonary embolism without acute cor pulmonale: Secondary | ICD-10-CM | POA: Insufficient documentation

## 2012-07-26 DIAGNOSIS — I739 Peripheral vascular disease, unspecified: Secondary | ICD-10-CM

## 2012-07-26 DIAGNOSIS — I2510 Atherosclerotic heart disease of native coronary artery without angina pectoris: Secondary | ICD-10-CM | POA: Insufficient documentation

## 2012-07-26 DIAGNOSIS — K922 Gastrointestinal hemorrhage, unspecified: Secondary | ICD-10-CM | POA: Insufficient documentation

## 2012-07-26 DIAGNOSIS — Z7901 Long term (current) use of anticoagulants: Secondary | ICD-10-CM | POA: Insufficient documentation

## 2012-07-27 ENCOUNTER — Encounter: Payer: Self-pay | Admitting: Cardiology

## 2012-07-27 ENCOUNTER — Ambulatory Visit (INDEPENDENT_AMBULATORY_CARE_PROVIDER_SITE_OTHER): Payer: Medicare Other | Admitting: Cardiology

## 2012-07-27 VITALS — BP 124/81 | HR 93 | Wt 289.0 lb

## 2012-07-27 DIAGNOSIS — I739 Peripheral vascular disease, unspecified: Secondary | ICD-10-CM

## 2012-07-27 DIAGNOSIS — I350 Nonrheumatic aortic (valve) stenosis: Secondary | ICD-10-CM

## 2012-07-27 DIAGNOSIS — I498 Other specified cardiac arrhythmias: Secondary | ICD-10-CM

## 2012-07-27 DIAGNOSIS — I10 Essential (primary) hypertension: Secondary | ICD-10-CM

## 2012-07-27 DIAGNOSIS — F17200 Nicotine dependence, unspecified, uncomplicated: Secondary | ICD-10-CM

## 2012-07-27 DIAGNOSIS — I779 Disorder of arteries and arterioles, unspecified: Secondary | ICD-10-CM

## 2012-07-27 DIAGNOSIS — I359 Nonrheumatic aortic valve disorder, unspecified: Secondary | ICD-10-CM

## 2012-07-27 DIAGNOSIS — I2699 Other pulmonary embolism without acute cor pulmonale: Secondary | ICD-10-CM

## 2012-07-27 DIAGNOSIS — I2510 Atherosclerotic heart disease of native coronary artery without angina pectoris: Secondary | ICD-10-CM

## 2012-07-27 NOTE — Assessment & Plan Note (Signed)
Blood pressure is controlled. No change in therapy today.

## 2012-07-27 NOTE — Progress Notes (Signed)
HPI  Patient is seen today for followup coronary disease and aortic stenosis and Coumadin therapy and hypertension and bradycardia and carotid artery disease. I saw him last March, 2012. After that visit the patient had a two-dimensional echo showing some progression of his aortic stenosis. I could not tell from the echo if his aortic valve is bicuspid. His ascending aorta could not be fully assessed on that study. Also after that visit the patient had a carotid Doppler in March, 2012. He has very mild bilateral disease.  As part of this evaluation I have reviewed the old records carefully. I have completely updated the new electronic medical record to reflect all of the significant cardiac problems outlined in the prior record.  The patient says today that when he gets up and around in the morning he has some fatigue in his legs. This does not sound like claudication. In addition he does not describe marked exertional shortness of breath or chest discomfort. He says that at times when leaning his head back he might feel slight dizziness. He has not had syncope  No Known Allergies  Current Outpatient Prescriptions  Medication Sig Dispense Refill  . amLODipine (NORVASC) 5 MG tablet TAKE 1 TABLET BY MOUTH EVERY DAY  30 tablet  11  . atorvastatin (LIPITOR) 80 MG tablet Take 80 mg by mouth daily.      . Coenzyme Q10 (COQ10) 100 MG CAPS Take 1 capsule by mouth.      . fish oil-omega-3 fatty acids 1000 MG capsule Take 1 g by mouth daily.      . isosorbide mononitrate (IMDUR) 30 MG 24 hr tablet TAKE 1 TABLET EVERY DAY  30 tablet  0  . losartan-hydrochlorothiazide (HYZAAR) 50-12.5 MG per tablet TAKE 1 TABLET BY MOUTH EVERY DAY  30 tablet  10  . warfarin (COUMADIN) 5 MG tablet USE AS DIRECTED  30 tablet  4  . [DISCONTINUED] LISINOPRIL PO Take by mouth daily.          History   Social History  . Marital Status: Divorced    Spouse Name: N/A    Number of Children: N/A  . Years of Education: N/A    Occupational History  . Not on file.   Social History Main Topics  . Smoking status: Current Every Day Smoker -- 0.2 packs/day  . Smokeless tobacco: Not on file  . Alcohol Use: Yes     Comment: 1 drink a month  . Drug Use: No  . Sexually Active:    Other Topics Concern  . Not on file   Social History Narrative  . No narrative on file    Family History  Problem Relation Age of Onset  . Cancer Father     Past Medical History  Diagnosis Date  . Hypertension   . Shortness of breath   . Bruises easily     and/or rash  . Hernia   . COPD (chronic obstructive pulmonary disease)   . Wears glasses   . History of blood clots 2007    in lungs  . CAD (coronary artery disease)     Catheterization, [place], July, 2006, EF 45%,  50% LAD, 50% RCA, 90% marginal, possible collaterals from LAD  to a branch of the PDA, medical therapy recommended  . GI bleed     2006, no significant abnormalities per the patient  . PAD (peripheral artery disease)   . Ejection fraction     EF 45%, catheterization, 2006  //  EF 55%, echo, March, 2012, wall abnormality at the base of the inferior wall  . Aortic stenosis     Moderate, echo, February, 2009, cannot rule out bicuspid aortic valve  //   moderately severe, echo,  March, 2012 ( progression since 2009)  . RBBB   . Pulmonary emboli     Significant, 2007, Coumadin therapy  . Warfarin anticoagulation     History pulmonary emboli  . Bradycardia   . Tobacco abuse   . Carotid artery disease     Doppler, March, 2012,  0-39% bilateral    Past Surgical History  Procedure Date  . Back surgery 1988 & 1990    2 surgeries. disc fusion, pins installed. (L-5-S1)  . Cholecystectomy 1998  . Nerve rotation     left arm    Patient Active Problem List  Diagnosis  . HYPERLIPIDEMIA  . CARPAL TUNNEL SYNDROME  . BRADYCARDIA  . TOBACCO ABUSE  . HYPERTENSION  . COPD  . HERNIA, UMBILICAL  . CAD (coronary artery disease)  . GI bleed  . PAD  (peripheral artery disease)  . Ejection fraction  . Aortic stenosis  . Pulmonary emboli  . Warfarin anticoagulation  . Carotid artery disease  . RBBB    ROS   Patient denies fever, chills, headache, sweats, rash, change in vision, change in hearing, chest pain, cough, nausea vomiting, urinary symptoms. All other systems are reviewed and are negative.  PHYSICAL EXAM  Patient is oriented to person time and place. Affect is normal. He is significantly overweight. However he is down a few pounds from March, 2012. He has poor dentition. There is no jugulovenous distention. He has a radiated murmur of aortic stenosis to his neck bilaterally. Lungs reveal scattered rhonchi. There is no respiratory distress. Cardiac exam reveals S1 and S2. There is a high pitched crescendo decrescendo systolic murmur consistent with aortic stenosis. The abdomen is protuberant. He has no peripheral edema. He has venous changes with skin discoloration in his ankles. There are no musculoskeletal deformities. There are no skin rashes.  Filed Vitals:   07/27/12 1053  Weight: 289 lb (131.09 kg)   EKG is done today and reviewed by me. There is sinus rhythm. There is a right bundle branch block which is old. There is no significant EKG change.  ASSESSMENT & PLAN

## 2012-07-27 NOTE — Assessment & Plan Note (Signed)
There is a history of peripheral arterial disease. He is not currently describing significant claudication. No further workup.

## 2012-07-27 NOTE — Assessment & Plan Note (Signed)
The patient has significant aortic stenosis. It was moderately severe by echo March, 2012. This represented progression since 2009. We are not really sure if he has a bicuspid aortic valve or not. We do not have good data concerning his ascending aorta. The patient will have a followup 2-D echo to assess his aortic valve and tried to assess his aorta. If his aorta cannot be assessed I will consider other studies for this.

## 2012-07-27 NOTE — Assessment & Plan Note (Signed)
The patient had mild carotid disease in March, 2012. He needs a followup study and this will be arranged. I will be in touch with him concerning the studies.

## 2012-07-27 NOTE — Assessment & Plan Note (Signed)
He has a history of significant pulmonary emboli and continues on Coumadin.

## 2012-07-27 NOTE — Assessment & Plan Note (Signed)
The patient is smoking 5-7 cigarettes per day. I counseled him to cut this down and try to stop if possible.

## 2012-07-27 NOTE — Assessment & Plan Note (Signed)
By history there's been some bradycardia. He's not having this now and it is not causing any significant issues. No further workup.

## 2012-07-27 NOTE — Assessment & Plan Note (Signed)
The patient has known coronary disease. I have not pushed for stress testing. His catheterization was done in 2006. We may consider a nuclear stress study next year.

## 2012-07-27 NOTE — Patient Instructions (Addendum)
Your physician wants you to follow-up in: 1 year.   You will receive a reminder letter in the mail two months in advance. If you don't receive a letter, please call our office to schedule the follow-up appointment.  Your physician recommends that you continue on your current medications as directed. Please refer to the Current Medication list given to you today.  Your physician has requested that you have an echocardiogram. Echocardiography is a painless test that uses sound waves to create images of your heart. It provides your doctor with information about the size and shape of your heart and how well your heart's chambers and valves are working. This procedure takes approximately one hour. There are no restrictions for this procedure.  Your physician has requested that you have a carotid duplex. This test is an ultrasound of the carotid arteries in your neck. It looks at blood flow through these arteries that supply the brain with blood. Allow one hour for this exam. There are no restrictions or special instructions.

## 2012-07-28 ENCOUNTER — Telehealth: Payer: Self-pay | Admitting: Cardiology

## 2012-07-28 DIAGNOSIS — I10 Essential (primary) hypertension: Secondary | ICD-10-CM

## 2012-07-28 DIAGNOSIS — I2510 Atherosclerotic heart disease of native coronary artery without angina pectoris: Secondary | ICD-10-CM

## 2012-07-28 NOTE — Telephone Encounter (Signed)
New Problem:    Patient called wanting to know if he needs to have blood work done.  Please call back.

## 2012-07-28 NOTE — Telephone Encounter (Signed)
Mr Dettmer is requesing labs.  He hasn't had any done since 11/2010.

## 2012-07-28 NOTE — Telephone Encounter (Signed)
Labs ordered (bmet, lipid) and scheduled.  Pt notified.

## 2012-07-29 ENCOUNTER — Other Ambulatory Visit (HOSPITAL_COMMUNITY): Payer: Medicare Other

## 2012-07-31 ENCOUNTER — Encounter (INDEPENDENT_AMBULATORY_CARE_PROVIDER_SITE_OTHER): Payer: Medicare Other

## 2012-07-31 ENCOUNTER — Other Ambulatory Visit: Payer: Medicare Other

## 2012-07-31 DIAGNOSIS — I6529 Occlusion and stenosis of unspecified carotid artery: Secondary | ICD-10-CM

## 2012-08-04 ENCOUNTER — Encounter (HOSPITAL_COMMUNITY): Payer: Self-pay | Admitting: Cardiology

## 2012-08-04 ENCOUNTER — Ambulatory Visit (HOSPITAL_COMMUNITY): Payer: Medicare Other | Attending: Cardiovascular Disease | Admitting: Radiology

## 2012-08-04 ENCOUNTER — Other Ambulatory Visit (INDEPENDENT_AMBULATORY_CARE_PROVIDER_SITE_OTHER): Payer: Medicare Other

## 2012-08-04 ENCOUNTER — Encounter: Payer: Self-pay | Admitting: Cardiology

## 2012-08-04 DIAGNOSIS — I10 Essential (primary) hypertension: Secondary | ICD-10-CM

## 2012-08-04 DIAGNOSIS — I2510 Atherosclerotic heart disease of native coronary artery without angina pectoris: Secondary | ICD-10-CM

## 2012-08-04 DIAGNOSIS — I517 Cardiomegaly: Secondary | ICD-10-CM | POA: Insufficient documentation

## 2012-08-04 DIAGNOSIS — I739 Peripheral vascular disease, unspecified: Secondary | ICD-10-CM | POA: Insufficient documentation

## 2012-08-04 DIAGNOSIS — I4510 Unspecified right bundle-branch block: Secondary | ICD-10-CM | POA: Insufficient documentation

## 2012-08-04 DIAGNOSIS — I350 Nonrheumatic aortic (valve) stenosis: Secondary | ICD-10-CM

## 2012-08-04 DIAGNOSIS — J449 Chronic obstructive pulmonary disease, unspecified: Secondary | ICD-10-CM | POA: Insufficient documentation

## 2012-08-04 DIAGNOSIS — J4489 Other specified chronic obstructive pulmonary disease: Secondary | ICD-10-CM | POA: Insufficient documentation

## 2012-08-04 DIAGNOSIS — I359 Nonrheumatic aortic valve disorder, unspecified: Secondary | ICD-10-CM

## 2012-08-04 DIAGNOSIS — F17200 Nicotine dependence, unspecified, uncomplicated: Secondary | ICD-10-CM | POA: Insufficient documentation

## 2012-08-04 LAB — BASIC METABOLIC PANEL
CO2: 27 mEq/L (ref 19–32)
GFR: 94.4 mL/min (ref >60.00)
Glucose, Bld: 124 mg/dL — ABNORMAL HIGH (ref 70–99)
Potassium: 3.8 mEq/L (ref 3.5–5.1)
Sodium: 135 mEq/L (ref 135–145)

## 2012-08-04 LAB — LIPID PANEL
HDL: 29.2 mg/dL — ABNORMAL LOW (ref >39.00)
VLDL: 16.8 mg/dL (ref 0.0–40.0)

## 2012-08-04 NOTE — Progress Notes (Signed)
Echocardiogram performed.  

## 2012-08-10 ENCOUNTER — Other Ambulatory Visit: Payer: Self-pay | Admitting: Internal Medicine

## 2012-08-10 ENCOUNTER — Other Ambulatory Visit: Payer: Self-pay | Admitting: *Deleted

## 2012-08-10 MED ORDER — ISOSORBIDE MONONITRATE ER 30 MG PO TB24: 30.0000 mg | ORAL_TABLET | Freq: Every day | ORAL | Status: DC

## 2012-08-11 ENCOUNTER — Ambulatory Visit (INDEPENDENT_AMBULATORY_CARE_PROVIDER_SITE_OTHER): Payer: Medicare Other | Admitting: General Practice

## 2012-08-11 DIAGNOSIS — Z7901 Long term (current) use of anticoagulants: Secondary | ICD-10-CM

## 2012-08-11 DIAGNOSIS — I2699 Other pulmonary embolism without acute cor pulmonale: Secondary | ICD-10-CM

## 2012-08-11 LAB — POCT INR: INR: 2.4

## 2012-09-08 ENCOUNTER — Ambulatory Visit (INDEPENDENT_AMBULATORY_CARE_PROVIDER_SITE_OTHER): Payer: Medicare Other | Admitting: General Practice

## 2012-09-08 DIAGNOSIS — Z7901 Long term (current) use of anticoagulants: Secondary | ICD-10-CM

## 2012-09-08 DIAGNOSIS — I2699 Other pulmonary embolism without acute cor pulmonale: Secondary | ICD-10-CM

## 2012-09-21 NOTE — Addendum Note (Signed)
Addended by: Reine Just on: 09/21/2012 02:18 PM   Modules accepted: Orders

## 2012-10-06 ENCOUNTER — Ambulatory Visit (INDEPENDENT_AMBULATORY_CARE_PROVIDER_SITE_OTHER): Payer: Medicare Other | Admitting: General Practice

## 2012-10-06 DIAGNOSIS — I2699 Other pulmonary embolism without acute cor pulmonale: Secondary | ICD-10-CM

## 2012-10-06 DIAGNOSIS — Z7901 Long term (current) use of anticoagulants: Secondary | ICD-10-CM

## 2012-10-17 ENCOUNTER — Other Ambulatory Visit: Payer: Self-pay

## 2012-10-20 ENCOUNTER — Other Ambulatory Visit: Payer: Self-pay | Admitting: Internal Medicine

## 2012-10-27 ENCOUNTER — Ambulatory Visit (INDEPENDENT_AMBULATORY_CARE_PROVIDER_SITE_OTHER): Payer: Medicare Other | Admitting: General Practice

## 2012-10-27 DIAGNOSIS — I2699 Other pulmonary embolism without acute cor pulmonale: Secondary | ICD-10-CM

## 2012-10-27 DIAGNOSIS — Z7901 Long term (current) use of anticoagulants: Secondary | ICD-10-CM

## 2012-10-27 LAB — POCT INR: INR: 3

## 2012-11-06 ENCOUNTER — Encounter: Payer: Self-pay | Admitting: Cardiology

## 2012-11-06 NOTE — Progress Notes (Unsigned)
   Today I have reviewed the patient's complete record. I saw him last November, 2013. He was stable at that time and was not having any significant symptoms of shortness of breath or chest pain. Because he had had some progression of his aortic valve disease over time a followup echo was arranged. Also followup carotid Dopplers were arranged. The carotid Doppler showed no major change.  The echo revealed an ejection fraction of 55%. There was akinesis at the base of the inferior wall and inferolateral wall. The mean gradient across aortic cells was 43 mm mercury and a peak gradient of 72 mmHg. This is consistent with severe aortic stenosis. As mentioned the patient was not having any significant symptoms. I will plan to bring him back for followup sooner than 6 months. I do not see any scheduled appointments listed. At that time will probe further concerning whether he really has symptoms or not. He may be getting close to the time that he needs aortic valve replacement.  Jerral Bonito, MD

## 2012-11-20 ENCOUNTER — Other Ambulatory Visit: Payer: Self-pay | Admitting: Internal Medicine

## 2012-11-27 ENCOUNTER — Ambulatory Visit (INDEPENDENT_AMBULATORY_CARE_PROVIDER_SITE_OTHER): Payer: Medicare Other | Admitting: General Practice

## 2012-11-27 DIAGNOSIS — I2699 Other pulmonary embolism without acute cor pulmonale: Secondary | ICD-10-CM

## 2012-11-27 DIAGNOSIS — Z7901 Long term (current) use of anticoagulants: Secondary | ICD-10-CM

## 2012-11-27 LAB — POCT INR: INR: 2.6

## 2012-12-10 ENCOUNTER — Ambulatory Visit (INDEPENDENT_AMBULATORY_CARE_PROVIDER_SITE_OTHER): Payer: Medicare Other | Admitting: Cardiology

## 2012-12-10 ENCOUNTER — Encounter: Payer: Self-pay | Admitting: Cardiology

## 2012-12-10 VITALS — BP 122/70 | HR 88 | Ht 71.0 in | Wt 291.0 lb

## 2012-12-10 DIAGNOSIS — F17200 Nicotine dependence, unspecified, uncomplicated: Secondary | ICD-10-CM

## 2012-12-10 DIAGNOSIS — I359 Nonrheumatic aortic valve disorder, unspecified: Secondary | ICD-10-CM

## 2012-12-10 DIAGNOSIS — I350 Nonrheumatic aortic (valve) stenosis: Secondary | ICD-10-CM

## 2012-12-10 DIAGNOSIS — I739 Peripheral vascular disease, unspecified: Secondary | ICD-10-CM

## 2012-12-10 DIAGNOSIS — I10 Essential (primary) hypertension: Secondary | ICD-10-CM

## 2012-12-10 DIAGNOSIS — I2510 Atherosclerotic heart disease of native coronary artery without angina pectoris: Secondary | ICD-10-CM

## 2012-12-10 DIAGNOSIS — Z7901 Long term (current) use of anticoagulants: Secondary | ICD-10-CM

## 2012-12-10 NOTE — Assessment & Plan Note (Signed)
The patient has symptoms that are probably compatible with claudication. We will arrange for arterial Dopplers of the legs.

## 2012-12-10 NOTE — Assessment & Plan Note (Signed)
Coronary disease is stable. I suspect it is possible that he may need CABG if and when his valve was replaced.

## 2012-12-10 NOTE — Progress Notes (Signed)
HPI  Patient is seen to followup aortic stenosis and coronary artery disease. I saw the patient last we arrange for a followup 2-D echo. This was done and I reviewed carefully. Ejection fraction was 55%. He does have a wall motion abnormality. The mean gradient was 43 mm mercury and a peak 72 mmHg. This is compatible with severe aortic stenosis. I brought him back for earlier followup in 6 months. He is active. He mentioned doing some planting in his garden tomorrow. He's not had chest pain syncope or shortness of breath. However I am concerned about the severity of his aortic stenosis.  The patient does mention that when walking he is getting some tightness in his calves. He may well have claudication.  No Known Allergies  Current Outpatient Prescriptions  Medication Sig Dispense Refill  . amLODipine (NORVASC) 5 MG tablet TAKE 1 TABLET BY MOUTH EVERY DAY  30 tablet  0  . atorvastatin (LIPITOR) 80 MG tablet Take 80 mg by mouth daily.      . Coenzyme Q10 (COQ10) 100 MG CAPS Take 1 capsule by mouth.      . fish oil-omega-3 fatty acids 1000 MG capsule Take 1 g by mouth daily.      . isosorbide mononitrate (IMDUR) 30 MG 24 hr tablet TAKE 1 TABLET EVERY DAY  30 tablet  0  . losartan-hydrochlorothiazide (HYZAAR) 50-12.5 MG per tablet TAKE 1 TABLET BY MOUTH EVERY DAY  30 tablet  10  . warfarin (COUMADIN) 5 MG tablet USE AS DIRECTED  30 tablet  4  . [DISCONTINUED] LISINOPRIL PO Take by mouth daily.         No current facility-administered medications for this visit.    History   Social History  . Marital Status: Divorced    Spouse Name: N/A    Number of Children: N/A  . Years of Education: N/A   Occupational History  . Not on file.   Social History Main Topics  . Smoking status: Current Every Day Smoker -- 0.25 packs/day  . Smokeless tobacco: Not on file  . Alcohol Use: Yes     Comment: 1 drink a month  . Drug Use: No  . Sexually Active:    Other Topics Concern  . Not on file    Social History Narrative  . No narrative on file    Family History  Problem Relation Age of Onset  . Cancer Father     Past Medical History  Diagnosis Date  . Hypertension   . Shortness of breath   . Bruises easily     and/or rash  . Hernia   . COPD (chronic obstructive pulmonary disease)   . Wears glasses   . History of blood clots 2007    in lungs  . CAD (coronary artery disease)     Catheterization, [place], July, 2006, EF 45%,  50% LAD, 50% RCA, 90% marginal, possible collaterals from LAD  to a branch of the PDA, medical therapy recommended  . GI bleed     2006, no significant abnormalities per the patient  . PAD (peripheral artery disease)   . Ejection fraction     EF 45%, catheterization, 2006  //   EF 55%, echo, March, 2012, wall abnormality at the base of the inferior wall  . Aortic stenosis     Moderate, echo, February, 2009, cannot rule out bicuspid aortic valve  //   moderately severe, echo,  March, 2012 ( progression since 2009)  . RBBB   .  Pulmonary emboli     Significant, 2007, Coumadin therapy  . Warfarin anticoagulation     History pulmonary emboli  . Bradycardia   . Tobacco abuse   . Carotid artery disease     Doppler, March, 2012,  0-39% bilateral    Past Surgical History  Procedure Laterality Date  . Back surgery  1988 & 1990    2 surgeries. disc fusion, pins installed. (L-5-S1)  . Cholecystectomy  1998  . Nerve rotation      left arm    Patient Active Problem List  Diagnosis  . HYPERLIPIDEMIA  . CARPAL TUNNEL SYNDROME  . BRADYCARDIA  . TOBACCO ABUSE  . HYPERTENSION  . COPD  . HERNIA, UMBILICAL  . CAD (coronary artery disease)  . GI bleed  . PAD (peripheral artery disease)  . Ejection fraction  . Aortic stenosis  . Pulmonary emboli  . Warfarin anticoagulation  . Carotid artery disease  . RBBB    ROS   Patient denies fever, chills, headache, sweats, rash, change in vision, change in hearing, chest pain, cough, nausea  vomiting, urinary symptoms. All other systems are reviewed and are negative.    PHYSICAL EXAM  Patient is oriented to person time and place. Affect is normal. He is overweight. There is no jugulovenous distention. Lungs are clear. Respiratory effort is nonlabored. The patient does have a radiated murmur in the neck. I cannot feel his carotid upstroke well. Cardiac exam reveals S1 and S2. There is a murmur of aortic stenosis. I do not hear his second heart sound along the right sternal border. Abdomen is soft. There is no peripheral edema. There no musculoskeletal deformities. He has venous changes from chronic venous abnormalities in his legs area. There no musculoskeletal deformities.  Filed Vitals:   12/10/12 1410  BP: 122/70  Pulse: 88  Height: 5\' 11"  (1.803 m)  Weight: 291 lb (131.997 kg)     ASSESSMENT & PLAN

## 2012-12-10 NOTE — Assessment & Plan Note (Signed)
Patient continues to smoke approximately 5 cigarettes per day. I've urged him to stop completely.

## 2012-12-10 NOTE — Assessment & Plan Note (Signed)
Blood pressures control. No change in therapy. 

## 2012-12-10 NOTE — Assessment & Plan Note (Signed)
The patient has severe aortic stenosis. At this point he does not have significant symptoms but I've explained to him that I feel we will probably move forward in the near future concerning his valve. The plan will be to repeat an echo in 6 months after his last echo and then an office visit after that time.

## 2012-12-10 NOTE — Patient Instructions (Addendum)
Your physician recommends that you schedule a follow-up appointment in: 6 weeks after doppler  Your physician has requested that you have a lower or upper extremity arterial duplex. This test is an ultrasound of the arteries in the legs or arms. It looks at arterial blood flow in the legs and arms. Allow one hour for Lower and Upper Arterial scans. There are no restrictions or special instructions  Your physician has requested that you have an echocardiogram after June 3.. Echocardiography is a painless test that uses sound waves to create images of your heart. It provides your doctor with information about the size and shape of your heart and how well your heart's chambers and valves are working. This procedure takes approximately one hour. There are no restrictions for this procedure.

## 2012-12-10 NOTE — Assessment & Plan Note (Signed)
The patient is on Coumadin for his pulmonary emboli.

## 2012-12-11 ENCOUNTER — Other Ambulatory Visit: Payer: Self-pay | Admitting: Internal Medicine

## 2012-12-18 ENCOUNTER — Other Ambulatory Visit: Payer: Self-pay | Admitting: Internal Medicine

## 2012-12-25 ENCOUNTER — Ambulatory Visit (INDEPENDENT_AMBULATORY_CARE_PROVIDER_SITE_OTHER): Payer: Medicare Other | Admitting: General Practice

## 2012-12-25 DIAGNOSIS — Z7901 Long term (current) use of anticoagulants: Secondary | ICD-10-CM

## 2012-12-25 DIAGNOSIS — I2699 Other pulmonary embolism without acute cor pulmonale: Secondary | ICD-10-CM

## 2012-12-29 ENCOUNTER — Encounter (INDEPENDENT_AMBULATORY_CARE_PROVIDER_SITE_OTHER): Payer: Medicare Other

## 2012-12-29 DIAGNOSIS — I739 Peripheral vascular disease, unspecified: Secondary | ICD-10-CM

## 2012-12-31 ENCOUNTER — Encounter: Payer: Self-pay | Admitting: Cardiology

## 2012-12-31 DIAGNOSIS — I739 Peripheral vascular disease, unspecified: Secondary | ICD-10-CM | POA: Insufficient documentation

## 2013-01-12 ENCOUNTER — Encounter: Payer: Self-pay | Admitting: Cardiology

## 2013-01-13 ENCOUNTER — Encounter: Payer: Self-pay | Admitting: Internal Medicine

## 2013-01-15 ENCOUNTER — Ambulatory Visit (INDEPENDENT_AMBULATORY_CARE_PROVIDER_SITE_OTHER): Payer: Medicare Other | Admitting: Cardiology

## 2013-01-15 ENCOUNTER — Encounter: Payer: Self-pay | Admitting: Cardiology

## 2013-01-15 VITALS — BP 120/82 | HR 88 | Ht 71.0 in | Wt 288.0 lb

## 2013-01-15 DIAGNOSIS — I350 Nonrheumatic aortic (valve) stenosis: Secondary | ICD-10-CM

## 2013-01-15 DIAGNOSIS — I359 Nonrheumatic aortic valve disorder, unspecified: Secondary | ICD-10-CM

## 2013-01-15 DIAGNOSIS — I739 Peripheral vascular disease, unspecified: Secondary | ICD-10-CM

## 2013-01-15 NOTE — Patient Instructions (Addendum)
**Note De-Identified  Obfuscation** Your physician recommends that you continue on your current medications as directed. Please refer to the Current Medication list given to you today.  Your physician recommends that you schedule a follow-up appointment in: in November

## 2013-01-15 NOTE — Assessment & Plan Note (Signed)
Aortic stenosis is severe. He will have a followup echo in a few months. I talked with him again about the probability that he will need aortic valve surgery over the next 6 months to several years

## 2013-01-15 NOTE — Progress Notes (Signed)
HPI  Patient is seen today to follow up aortic stenosis and leg discomfort. I saw him recently I arranged for a Doppler of his legs. Fortunately this does not show any severe disease. He is increase his exercise and he is losing a few pounds.  No Known Allergies  Current Outpatient Prescriptions  Medication Sig Dispense Refill  . atorvastatin (LIPITOR) 80 MG tablet Take 80 mg by mouth daily.      . Coenzyme Q10 (COQ10) 100 MG CAPS Take 1 capsule by mouth.      . fish oil-omega-3 fatty acids 1000 MG capsule Take 1 g by mouth daily.      . isosorbide mononitrate (IMDUR) 30 MG 24 hr tablet TAKE 1 TABLET EVERY DAY  30 tablet  2  . losartan-hydrochlorothiazide (HYZAAR) 50-12.5 MG per tablet TAKE 1 TABLET BY MOUTH EVERY DAY  30 tablet  10  . warfarin (COUMADIN) 5 MG tablet TAKE AS DIRECTED  30 tablet  4  . amLODipine (NORVASC) 5 MG tablet       . [DISCONTINUED] LISINOPRIL PO Take by mouth daily.         No current facility-administered medications for this visit.    History   Social History  . Marital Status: Divorced    Spouse Name: N/A    Number of Children: N/A  . Years of Education: N/A   Occupational History  . Not on file.   Social History Main Topics  . Smoking status: Current Every Day Smoker -- 0.25 packs/day  . Smokeless tobacco: Not on file  . Alcohol Use: Yes     Comment: 1 drink a month  . Drug Use: No  . Sexually Active: Not on file   Other Topics Concern  . 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    Family History  Problem Relation Age of Onset  . Diabetes Father   . Cancer Father     large cell lung cancer  . Heart disease Mother     Past Medical History  Diagnosis Date  . Hypertension   . Shortness of breath   . Bruises  easily     and/or rash  . Hernia   . COPD (chronic obstructive pulmonary disease)   . Wears glasses   . History of blood clots 2007    in lungs  . CAD (coronary artery disease)     Catheterization, [place], July, 2006, EF 45%,  50% LAD, 50% RCA, 90% marginal, possible collaterals from LAD  to a branch of the PDA, medical therapy recommended  . GI bleed     2006, no significant abnormalities per the patient  . PAD (peripheral artery disease)     Arterial leg Dopplers, April, 2014, normal  . Ejection fraction     EF 45%, catheterization, 2006  //   EF 55%, echo, March, 2012, wall abnormality at the base of the inferior wall  . Aortic stenosis     Moderate, echo, February, 2009, cannot rule out bicuspid aortic valve  //   moderately severe, echo,  March, 2012 ( progression since 2009)  . RBBB   . Pulmonary emboli     Significant, 2007, Coumadin therapy  . Warfarin  anticoagulation     History pulmonary emboli  . Bradycardia   . Tobacco abuse   . Carotid artery disease     Doppler, March, 2012,  0-39% bilateral  . Dyslipidemia     Past Surgical History  Procedure Laterality Date  . Back surgery  1988 & 1990    2 surgeries. disc fusion, pins installed. (L-5-S1)  . Cholecystectomy  1998  . Nerve rotation      left arm  . Tonsillectomy    . Gallbladder surgery      Patient Active Problem List   Diagnosis Date Noted  . PAD (peripheral artery disease)   . CAD (coronary artery disease)   . GI bleed   . Ejection fraction   . Aortic stenosis   . Pulmonary emboli   . Warfarin anticoagulation   . Carotid artery disease   . RBBB   . HERNIA, UMBILICAL 11/06/2010  . TOBACCO ABUSE 09/25/2010  . HYPERTENSION 09/24/2010  . COPD 09/24/2010  . BRADYCARDIA 01/26/2009  . HYPERLIPIDEMIA 06/20/2007  . CARPAL TUNNEL SYNDROME 06/20/2007    ROS   Patient denies fever, chills, headache, sweats, rash, change in vision, change in hearing, chest pain, cough, nausea vomiting, urinary  symptoms. All other systems are reviewed and are negative.  PHYSICAL EXAM  Patient is oriented to person time and place. Lungs are clear. Respiratory effort is nonlabored. Cardiac exam reveals a crescendo decrescendo systolic murmur. The abdomen is soft. Is no peripheral edema.  Filed Vitals:   01/15/13 1440  BP: 120/82  Pulse: 88  Height: 5\' 11"  (1.803 m)  Weight: 288 lb (130.636 kg)    ASSESSMENT & PLAN

## 2013-01-15 NOTE — Assessment & Plan Note (Signed)
Fortunately the patient's Dopplers reveal no major stenoses. No further workup.

## 2013-02-01 ENCOUNTER — Ambulatory Visit: Payer: Medicare Other | Admitting: Cardiology

## 2013-02-02 ENCOUNTER — Ambulatory Visit (INDEPENDENT_AMBULATORY_CARE_PROVIDER_SITE_OTHER): Payer: Medicare Other | Admitting: Internal Medicine

## 2013-02-02 ENCOUNTER — Ambulatory Visit (INDEPENDENT_AMBULATORY_CARE_PROVIDER_SITE_OTHER): Payer: Medicare Other | Admitting: Family Medicine

## 2013-02-02 ENCOUNTER — Encounter: Payer: Self-pay | Admitting: Internal Medicine

## 2013-02-02 VITALS — BP 116/76 | HR 83 | Temp 98.7°F | Resp 20 | Ht 71.0 in | Wt 285.4 lb

## 2013-02-02 DIAGNOSIS — E785 Hyperlipidemia, unspecified: Secondary | ICD-10-CM

## 2013-02-02 DIAGNOSIS — I350 Nonrheumatic aortic (valve) stenosis: Secondary | ICD-10-CM

## 2013-02-02 DIAGNOSIS — I359 Nonrheumatic aortic valve disorder, unspecified: Secondary | ICD-10-CM

## 2013-02-02 DIAGNOSIS — I2699 Other pulmonary embolism without acute cor pulmonale: Secondary | ICD-10-CM

## 2013-02-02 DIAGNOSIS — E669 Obesity, unspecified: Secondary | ICD-10-CM

## 2013-02-02 DIAGNOSIS — Z0000 Encounter for general adult medical examination without abnormal findings: Secondary | ICD-10-CM

## 2013-02-02 DIAGNOSIS — E66812 Obesity, class 2: Secondary | ICD-10-CM | POA: Insufficient documentation

## 2013-02-02 DIAGNOSIS — Z7901 Long term (current) use of anticoagulants: Secondary | ICD-10-CM

## 2013-02-02 DIAGNOSIS — I739 Peripheral vascular disease, unspecified: Secondary | ICD-10-CM

## 2013-02-02 LAB — POCT INR: INR: 3.4

## 2013-02-02 MED ORDER — ISOSORBIDE MONONITRATE ER 30 MG PO TB24: ORAL_TABLET | ORAL | Status: DC

## 2013-02-02 MED ORDER — ATORVASTATIN CALCIUM 80 MG PO TABS: 80.0000 mg | ORAL_TABLET | Freq: Every day | ORAL | Status: DC

## 2013-02-02 MED ORDER — AMLODIPINE BESYLATE 5 MG PO TABS: 5.0000 mg | ORAL_TABLET | Freq: Every day | ORAL | Status: DC

## 2013-02-02 MED ORDER — HYDROCODONE-ACETAMINOPHEN 5-325 MG PO TABS: 1.0000 | ORAL_TABLET | Freq: Four times a day (QID) | ORAL | Status: DC | PRN

## 2013-02-02 NOTE — Assessment & Plan Note (Signed)
stongly advised he have colonoscopy and given his co-morbidities he is a good candidate for virtual colonoscopy. He will get back to me. Immunizations - he has had pneumonia vaccine and tetanus Rx for shingles vaccine provided

## 2013-02-02 NOTE — Patient Instructions (Addendum)
1. Aortic stenosis - nice murmur. Keep follow up with Dr. [name]  2. Cholesterol - last lab in December with good control. Not due for repeat lab until Dec '14 Continue present medication  3. Weight management - a very important issue, especially if there is valve replacement surgery in your future. Diet management: smart food choices, PORTION SIZE CONTROL, regular exercise. Goal - to loose 1-2 lbs.month. Target weight - 220 lbs  4. Blood pressure is very well controlled. You have had recent enough lab, Dec.  BP Readings from Last 3 Encounters:  02/02/13 116/76  01/15/13 120/82  12/10/12 122/70   No change in medication  5. Intermittent back pain -   Plan  Weight management  Daily back exercises to prevent flares of back pain. Go to [email or web address] and search for back pain and exercise to review instructional videos  Hydrocodone/APAP 5/325 every 6 hours as needed.  6. Health maintenance - very important to have colorectal cancer screening. Immunizations - current with tetanus and pneumonia vaccine. With medicare part D you can take an Rx to a participating pharmacy.

## 2013-02-02 NOTE — Assessment & Plan Note (Signed)
He is stable and followed routinely by Dr. [name]

## 2013-02-02 NOTE — Assessment & Plan Note (Signed)
No results found for this basename: PROTEIN24HR   lab in Dec '13 - at goal with LDL < 80, HDL to low.  Plan Continue present medication

## 2013-02-02 NOTE — Assessment & Plan Note (Signed)
Mobidly obese, especially with his heart and valve condition with high likelihood of needing surgery.  Plan - Diet management: smart food choices, PORTION SIZE CONTROL, regular exercise. Goal - to loose 1-2 lbs.month. Target weight - 220 lbs

## 2013-02-02 NOTE — Progress Notes (Signed)
Subjective:    Patient ID: Chris Reyes, male    DOB: Nov 24, 1950, 62 y.o.   MRN: [id number]  HPI Chris Reyes presents for medication refills. He has not been seen for about a year. He has been followed closely by Chris Reyes with most recent visit in Mid-May: he had had LE dopplers - normal and was due for 2D echo to monitor severe aortic stenosis. He has his lipids checked in [place]. '13 - well controlled. He has been stable from a cardiac perspective.   He reports he will have intermittent back pain without radiculopathy. He cannot take NSIADs due warfarin. He reports no relief with high dose APAP, although he never has taken APAP on a routine basis. He reports that the last Rx for hydrocodone/APAP last year has lasted up till now.   He has chronic recurrent sinus headache. He has minimal drainage and has no history of allergy.  Past Medical History  Diagnosis Date  . Hypertension   . Shortness of breath   . Bruises easily     and/or rash  . Hernia   . COPD (chronic obstructive pulmonary disease)   . Wears glasses   . History of blood clots 2007    in lungs  . CAD (coronary artery disease)     Catheterization, [place], July, 2006, EF 45%,  50% LAD, 50% RCA, 90% marginal, possible collaterals from LAD  to a branch of the PDA, medical therapy recommended  . GI bleed     2006, no significant abnormalities per the patient  . PAD (peripheral artery disease)     Arterial leg Dopplers, April, 2014, normal  . Ejection fraction     EF 45%, catheterization, 2006  //   EF 55%, echo, March, 2012, wall abnormality at the base of the inferior wall  . Aortic stenosis     Moderate, echo, February, 2009, cannot rule out bicuspid aortic valve  //   moderately severe, echo,  March, 2012 ( progression since 2009)  . RBBB   . Pulmonary emboli     Significant, 2007, Coumadin therapy  . Warfarin anticoagulation     History pulmonary emboli  . Bradycardia   . Tobacco abuse   . Carotid artery disease      Doppler, March, 2012,  0-39% bilateral  . Dyslipidemia    Past Surgical History  Procedure Laterality Date  . Back surgery  1988 & 1990    2 surgeries. disc fusion, pins installed. (L-5-S1)  . Cholecystectomy  1998  . Nerve rotation      left arm  . Tonsillectomy    . Gallbladder surgery     Family History  Problem Relation Age of Onset  . Diabetes Father   . Cancer Father     large cell lung cancer  . Heart disease Mother    History   Social History  . Marital Status: Divorced    Spouse Name: N/A    Number of Children: N/A  . Years of Education: N/A   Occupational History  . Not on file.   Social History Main Topics  . Smoking status: Current Every Day Smoker -- 0.25 packs/day    Types: Cigarettes  . Smokeless tobacco: Not on file     Comment: 5-10 cigarettes per day  . Alcohol Use: Yes     Comment: 1 drink a month  . Drug Use: No  . Sexually Active: Not on file   Other Topics Concern  . 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   Current Outpatient Prescriptions on File Prior to Visit  Medication Sig Dispense Refill  . Coenzyme Q10 (COQ10) 100 MG CAPS Take 1 capsule by mouth.      . fish oil-omega-3 fatty acids 1000 MG capsule Take 1 g by mouth daily.      Marland Kitchen losartan-hydrochlorothiazide (HYZAAR) 50-12.5 MG per tablet TAKE 1 TABLET BY MOUTH EVERY DAY  30 tablet  10  . warfarin (COUMADIN) 5 MG tablet TAKE AS DIRECTED  30 tablet  4  . [DISCONTINUED] LISINOPRIL PO Take by mouth daily.         No current facility-administered medications on file prior to visit.        Review of Systems System review is negative for any constitutional, cardiac, pulmonary, GI or neuro symptoms or complaints other than as described in the HPI.      Objective:   Physical Exam Filed Vitals:   02/02/13 1337  BP: 116/76  Pulse: 83  Temp: 98.7 F (37.1 C)  Resp: 20   Wt Readings from Last 3 Encounters:  02/02/13 285 lb 6.4 oz (129.457 kg)  01/15/13 288 lb (130.636 kg)  12/10/12 291 lb (131.997 kg)   gen'l- overweight white man in no distress HEENT- poor dentition. C&S clear, EACs/TMs normal Neck - supple, no JVD Cor- 2+ radial, Heart sounds distant, frequent PVCs, II/VI harsh holosystolic murmur best at right sternal border. Pulm - normal respirations, CTAP Abd- morbidly obese. Neuro - A&O x 3       Assessment & Plan:

## 2013-02-02 NOTE — Assessment & Plan Note (Signed)
Normal doppler in April '14. He does not c/o claudication

## 2013-02-08 ENCOUNTER — Ambulatory Visit (HOSPITAL_COMMUNITY): Payer: Medicare Other | Attending: Cardiology

## 2013-02-08 DIAGNOSIS — J4489 Other specified chronic obstructive pulmonary disease: Secondary | ICD-10-CM | POA: Insufficient documentation

## 2013-02-08 DIAGNOSIS — J449 Chronic obstructive pulmonary disease, unspecified: Secondary | ICD-10-CM | POA: Insufficient documentation

## 2013-02-08 DIAGNOSIS — I350 Nonrheumatic aortic (valve) stenosis: Secondary | ICD-10-CM

## 2013-02-08 DIAGNOSIS — I2510 Atherosclerotic heart disease of native coronary artery without angina pectoris: Secondary | ICD-10-CM | POA: Insufficient documentation

## 2013-02-08 DIAGNOSIS — I10 Essential (primary) hypertension: Secondary | ICD-10-CM | POA: Insufficient documentation

## 2013-02-08 DIAGNOSIS — I359 Nonrheumatic aortic valve disorder, unspecified: Secondary | ICD-10-CM

## 2013-02-08 DIAGNOSIS — F17200 Nicotine dependence, unspecified, uncomplicated: Secondary | ICD-10-CM | POA: Insufficient documentation

## 2013-02-08 NOTE — Progress Notes (Signed)
Echocardiogram performed.  

## 2013-02-15 ENCOUNTER — Encounter: Payer: Self-pay | Admitting: Cardiology

## 2013-03-02 ENCOUNTER — Ambulatory Visit (INDEPENDENT_AMBULATORY_CARE_PROVIDER_SITE_OTHER): Payer: Medicare Other | Admitting: General Practice

## 2013-03-02 DIAGNOSIS — I2699 Other pulmonary embolism without acute cor pulmonale: Secondary | ICD-10-CM

## 2013-03-02 DIAGNOSIS — Z7901 Long term (current) use of anticoagulants: Secondary | ICD-10-CM

## 2013-03-02 LAB — POCT INR: INR: 2.5

## 2013-03-25 ENCOUNTER — Other Ambulatory Visit: Payer: Self-pay | Admitting: Internal Medicine

## 2013-03-30 ENCOUNTER — Ambulatory Visit (INDEPENDENT_AMBULATORY_CARE_PROVIDER_SITE_OTHER): Payer: Medicare Other | Admitting: General Practice

## 2013-03-30 DIAGNOSIS — I2699 Other pulmonary embolism without acute cor pulmonale: Secondary | ICD-10-CM

## 2013-03-30 DIAGNOSIS — Z7901 Long term (current) use of anticoagulants: Secondary | ICD-10-CM

## 2013-05-11 ENCOUNTER — Ambulatory Visit (INDEPENDENT_AMBULATORY_CARE_PROVIDER_SITE_OTHER): Payer: Medicare Other | Admitting: General Practice

## 2013-05-11 DIAGNOSIS — I2699 Other pulmonary embolism without acute cor pulmonale: Secondary | ICD-10-CM

## 2013-05-11 DIAGNOSIS — Z7901 Long term (current) use of anticoagulants: Secondary | ICD-10-CM

## 2013-05-11 LAB — POCT INR: INR: 3.1

## 2013-06-09 ENCOUNTER — Other Ambulatory Visit: Payer: Self-pay | Admitting: Internal Medicine

## 2013-06-25 ENCOUNTER — Other Ambulatory Visit: Payer: Self-pay | Admitting: Internal Medicine

## 2013-06-29 ENCOUNTER — Ambulatory Visit (INDEPENDENT_AMBULATORY_CARE_PROVIDER_SITE_OTHER): Payer: Medicare Other | Admitting: General Practice

## 2013-06-29 DIAGNOSIS — I2699 Other pulmonary embolism without acute cor pulmonale: Secondary | ICD-10-CM

## 2013-06-29 DIAGNOSIS — Z7901 Long term (current) use of anticoagulants: Secondary | ICD-10-CM

## 2013-06-29 LAB — POCT INR: INR: 2.1

## 2013-07-08 ENCOUNTER — Other Ambulatory Visit: Payer: Self-pay

## 2013-08-17 ENCOUNTER — Ambulatory Visit (INDEPENDENT_AMBULATORY_CARE_PROVIDER_SITE_OTHER): Payer: Medicare Other | Admitting: General Practice

## 2013-08-17 DIAGNOSIS — I2699 Other pulmonary embolism without acute cor pulmonale: Secondary | ICD-10-CM

## 2013-08-17 DIAGNOSIS — Z7901 Long term (current) use of anticoagulants: Secondary | ICD-10-CM

## 2013-08-17 LAB — POCT INR: INR: 2.8

## 2013-08-17 NOTE — Progress Notes (Signed)
Pre-visit discussion using our clinic review tool. No additional management support is needed unless otherwise documented below in the visit note.  

## 2013-08-24 ENCOUNTER — Telehealth: Payer: Self-pay

## 2013-08-24 NOTE — Telephone Encounter (Signed)
Called patient: cough w/o fever, w/o sputum, w/o SOB  Rec: continue robitussin DM, APAP  Call for fever, purulent sputum or SOB

## 2013-08-24 NOTE — Telephone Encounter (Signed)
The patient called hoping to get something called in for chest congestion.  He stated he wants to avoid coming in for an ov, but is hoping something can be called into the pharmacy.   Callback - [phone]

## 2013-08-30 ENCOUNTER — Ambulatory Visit (INDEPENDENT_AMBULATORY_CARE_PROVIDER_SITE_OTHER): Payer: Medicare Other | Admitting: Internal Medicine

## 2013-08-30 ENCOUNTER — Encounter: Payer: Self-pay | Admitting: Internal Medicine

## 2013-08-30 ENCOUNTER — Ambulatory Visit (INDEPENDENT_AMBULATORY_CARE_PROVIDER_SITE_OTHER)
Admission: RE | Admit: 2013-08-30 | Discharge: 2013-08-30 | Disposition: A | Discharge status: Home / Self Care | Payer: Medicare Other | Source: Ambulatory Visit | Attending: Internal Medicine | Admitting: Internal Medicine

## 2013-08-30 VITALS — BP 102/82 | HR 80 | Temp 98.4°F

## 2013-08-30 DIAGNOSIS — R05 Cough: Secondary | ICD-10-CM

## 2013-08-30 DIAGNOSIS — F17200 Nicotine dependence, unspecified, uncomplicated: Secondary | ICD-10-CM

## 2013-08-30 DIAGNOSIS — J329 Chronic sinusitis, unspecified: Secondary | ICD-10-CM

## 2013-08-30 DIAGNOSIS — I2699 Other pulmonary embolism without acute cor pulmonale: Secondary | ICD-10-CM

## 2013-08-30 MED ORDER — PREDNISONE (PAK) 10 MG PO TABS: ORAL_TABLET | ORAL | Status: DC

## 2013-08-30 MED ORDER — FLUTICASONE PROPIONATE 50 MCG/ACT NA SUSP: 1.0000 | Freq: Every day | NASAL | Status: AC

## 2013-08-30 MED ORDER — PHENYLEPH-PROMETHAZINE-COD 5-6.25-10 MG/5ML PO SYRP: 5.0000 mL | ORAL_SOLUTION | ORAL | Status: DC | PRN

## 2013-08-30 MED ORDER — AMOXICILLIN-POT CLAVULANATE 875-125 MG PO TABS: 1.0000 | ORAL_TABLET | Freq: Two times a day (BID) | ORAL | Status: AC

## 2013-08-30 NOTE — Progress Notes (Signed)
Pre-visit discussion using our clinic review tool. No additional management support is needed unless otherwise documented below in the visit note.  

## 2013-08-30 NOTE — Progress Notes (Signed)
Subjective:    Patient ID: Chris Reyes, male    DOB: 24-Sep-1950, 62 y.o.   MRN: [id number]  Cough This is a chronic problem. The current episode started 1 to 4 weeks ago. The problem has been unchanged. The problem occurs every few minutes. The cough is non-productive. Associated symptoms include nasal congestion, postnasal drip and shortness of breath (with exertion). Pertinent negatives include no chest pain, ear congestion, ear pain, fever, headaches, heartburn, hemoptysis, myalgias, rash, rhinorrhea, sore throat, weight loss or wheezing.   Past Medical History  Diagnosis Date  . Hypertension   . Shortness of breath   . Bruises easily     and/or rash  . Hernia   . COPD (chronic obstructive pulmonary disease)   . Wears glasses   . History of blood clots 2007    in lungs  . CAD (coronary artery disease)     Catheterization, [place], July, 2006, EF 45%,  50% LAD, 50% RCA, 90% marginal, possible collaterals from LAD  to a branch of the PDA, medical therapy recommended  . GI bleed     2006, no significant abnormalities per the patient  . PAD (peripheral artery disease)     Arterial leg Dopplers, April, 2014, normal  . Ejection fraction     EF 45%, catheterization, 2006  //   EF 55%, echo, March, 2012, wall abnormality at the base of the inferior wall  . Aortic stenosis     Moderate, echo, February, 2009, cannot rule out bicuspid aortic valve  //   moderately severe, echo,  March, 2012 ( progression since 2009)  . RBBB   . Pulmonary emboli     Significant, 2007, Coumadin therapy  . Warfarin anticoagulation     History pulmonary emboli  . Bradycardia   . Tobacco abuse   . Carotid artery disease     Doppler, March, 2012,  0-39% bilateral  . Dyslipidemia     Review of Systems  Constitutional: Negative for fever and weight loss.  HENT: Positive for postnasal drip. Negative for ear pain, rhinorrhea and sore throat.   Respiratory: Positive for cough and shortness of breath  (with exertion). Negative for hemoptysis and wheezing.   Cardiovascular: Negative for chest pain.  Gastrointestinal: Negative for heartburn.  Musculoskeletal: Negative for myalgias.  Skin: Negative for rash.  Neurological: Negative for headaches.       Objective:   Physical Exam BP 102/82  Pulse 80  Temp(Src) 98.4 F (36.9 C) (Oral)  SpO2 95% Wt Readings from Last 3 Encounters:  02/02/13 285 lb 6.4 oz (129.457 kg)  01/15/13 288 lb (130.636 kg)  12/10/12 291 lb (131.997 kg)    Constitutional: he is obese, smells heavily of tobacco abuse but appears well-developed and well-nourished. No distress.  HENT: Head: Normocephalic and [email or web address] sinus tenderness to palpation over maxillary region bilaterally Ears: B TMs ok, no erythema or effusion; Nose: swollen pale turbinates with thick discharge. Mouth/Throat: PND -Oropharynx is clear and moist. No oropharyngeal exudate. poor dentition  Eyes: Conjunctivae and EOM are normal. Pupils are equal, round, and reactive to light. No scleral icterus.  Neck: Normal range of motion. Neck supple. No JVD present. No thyromegaly present.  Cardiovascular: Normal rate, regular rhythm and normal heart sounds.  No murmur heard. No BLE edema. Pulmonary/Chest: Effort normal, breath sounds diminished bilateral bases. No respiratory distress. he has no wheezes and no crackle.  Neurological: he is alert and oriented to person, place, and time. No cranial nerve deficit. Coordination,  balance, strength, speech and gait are normal.  Skin: Skin is warm and dry. No rash noted. No erythema.  Psychiatric: he has a normal mood and affect. behavior is normal. Judgment and thought content normal.  Lab Results  Component Value Date   WBC 13.0* 11/06/2010   HGB 18.3 Critical result called to Ami on 11/06/2010 3:26 PM by Scales, [place]. Results were read back to caller.* 11/06/2010   HCT 52.2* 11/06/2010   PLT 233.0 11/06/2010   GLUCOSE 124* 08/04/2012   CHOL 117 08/04/2012    TRIG 84.0 08/04/2012   HDL 29.20* 08/04/2012   LDLDIRECT 138.0 01/26/2009   LDLCALC 71 08/04/2012   ALT 25 11/19/2010   AST 26 11/19/2010   NA 135 08/04/2012   K 3.8 08/04/2012   CL 101 08/04/2012   CREATININE 0.9 08/04/2012   BUN 12 08/04/2012   CO2 27 08/04/2012   TSH 1.60 11/06/2010   PSA 4.70* 11/06/2010   INR 2.8 08/17/2013      Assessment & Plan:   Cough. Suspect multifactorial - postnasal drip from sinusitis. - element of COPD with seasonal exacerbation  - ongoing tobacco abuse  Check two-view chest x-ray today  Treat sinus with Augmentin x10 days, prednisone taper over 6 days, nasal steroid and Promethazine VC with codeine  Advised on need for tobacco cessation -

## 2013-08-30 NOTE — Assessment & Plan Note (Signed)
History of same 2007 Chronic anticoagulation related to same Lab Results  Component Value Date   INR 2.8 08/17/2013   INR 2.1 06/29/2013   INR 3.1 05/11/2013   PROTIME 19.5 01/26/2009

## 2013-08-30 NOTE — Patient Instructions (Addendum)
It was good to see you today.  Chest x-ray ordered today. Your results will be released to MyChart (or called to you) after review, usually within 72hours after test completion. If any changes need to be made, you will be notified at that same time.  Augmentin antibiotics twice daily for 10 days, prednisone taper for 6 days, nasal steroid and prescription cough syrup -  Your prescription(s) have been submitted to your pharmacy. (cough syrup prescription given to you to take to your pharmacy) Please take as directed and contact our office if you believe you are having problem(s) with the medication(s).  continue tylenol for aches, pain and fever symptoms as discussed  Hydrate, rest and call if worse or unimproved  Continue to think about giving up cigarettes! Use nicotine gum, nicotine patches or electronic cigarettes. If you're interested in medication to help you quit, please call  - let me know how I can help!  You Can Quit Smoking If you are ready to quit smoking or are thinking about it, congratulations! You have chosen to help yourself be healthier and live longer! There are lots of different ways to quit smoking. Nicotine gum, nicotine patches, a nicotine inhaler, or nicotine nasal spray can help with physical craving. Hypnosis, support groups, and medicines help break the habit of smoking. TIPS TO GET OFF AND STAY OFF CIGARETTES  Learn to predict your moods. Do not let a bad situation be your excuse to have a cigarette. Some situations in your life might tempt you to have a cigarette.  Ask friends and co-workers not to smoke around you.  Make your home smoke-free.  Never have "just one" cigarette. It leads to wanting another and another. Remind yourself of your decision to quit.  On a card, make a list of your reasons for not smoking. Read it at least the same number of times a day as you have a cigarette. Tell yourself everyday, "I do not want to smoke. I choose not to smoke."  Ask  someone at home or work to help you with your plan to quit smoking.  Have something planned after you eat or have a cup of coffee. Take a walk or get other exercise to perk you up. This will help to keep you from overeating.  Try a relaxation exercise to calm you down and decrease your stress. Remember, you may be tense and nervous the first two weeks after you quit. This will pass.  Find new activities to keep your hands busy. Play with a pen, coin, or rubber band. Doodle or draw things on paper.  Brush your teeth right after eating. This will help cut down the craving for the taste of tobacco after meals. You can try mouthwash too.  Try gum, breath mints, or diet candy to keep something in your mouth. IF YOU SMOKE AND WANT TO QUIT:  Do not stock up on cigarettes. Never buy a carton. Wait until one pack is finished before you buy another.  Never carry cigarettes with you at work or at home.  Keep cigarettes as far away from you as possible. Leave them with someone else.  Never carry matches or a lighter with you.  Ask yourself, "Do I need this cigarette or is this just a reflex?"  Bet with someone that you can quit. Put cigarette money in a piggy bank every morning. If you smoke, you give up the money. If you do not smoke, by the end of the week, you keep the  money.  Keep trying. It takes 21 days to change a habit!  Talk to your doctor about using medicines to help you quit. These include nicotine replacement gum, lozenges, or skin patches. Document Released: 06/15/2009 Document Revised: 11/11/2011 Document Reviewed: 06/15/2009 [facility] Patient Information 2014 [place], [place].

## 2013-09-06 ENCOUNTER — Other Ambulatory Visit: Payer: Self-pay | Admitting: Internal Medicine

## 2013-09-06 NOTE — Telephone Encounter (Signed)
Script has been faxed

## 2013-09-23 ENCOUNTER — Other Ambulatory Visit: Payer: Self-pay | Admitting: Internal Medicine

## 2013-09-23 ENCOUNTER — Other Ambulatory Visit: Payer: Self-pay | Admitting: General Practice

## 2013-09-23 MED ORDER — WARFARIN SODIUM 5 MG PO TABS: ORAL_TABLET | ORAL | Status: DC

## 2013-09-28 ENCOUNTER — Ambulatory Visit (INDEPENDENT_AMBULATORY_CARE_PROVIDER_SITE_OTHER): Payer: Medicare Other | Admitting: General Practice

## 2013-09-28 DIAGNOSIS — Z7901 Long term (current) use of anticoagulants: Secondary | ICD-10-CM

## 2013-09-28 DIAGNOSIS — Z5181 Encounter for therapeutic drug level monitoring: Secondary | ICD-10-CM

## 2013-09-28 DIAGNOSIS — I2699 Other pulmonary embolism without acute cor pulmonale: Secondary | ICD-10-CM

## 2013-09-28 LAB — POCT INR: INR: 3.6

## 2013-09-28 NOTE — Progress Notes (Signed)
Pre-visit discussion using our clinic review tool. No additional management support is needed unless otherwise documented below in the visit note.  

## 2013-11-09 ENCOUNTER — Ambulatory Visit (INDEPENDENT_AMBULATORY_CARE_PROVIDER_SITE_OTHER): Payer: Medicare Other | Admitting: General Practice

## 2013-11-09 DIAGNOSIS — Z5181 Encounter for therapeutic drug level monitoring: Secondary | ICD-10-CM

## 2013-11-09 DIAGNOSIS — I2699 Other pulmonary embolism without acute cor pulmonale: Secondary | ICD-10-CM

## 2013-11-09 LAB — POCT INR: INR: 2.4

## 2013-11-09 NOTE — Progress Notes (Signed)
Pre visit review using our clinic review tool, if applicable. No additional management support is needed unless otherwise documented below in the visit note. 

## 2013-12-21 ENCOUNTER — Other Ambulatory Visit (INDEPENDENT_AMBULATORY_CARE_PROVIDER_SITE_OTHER): Payer: Medicare Other

## 2013-12-21 ENCOUNTER — Ambulatory Visit (INDEPENDENT_AMBULATORY_CARE_PROVIDER_SITE_OTHER): Payer: Medicare Other | Admitting: Internal Medicine

## 2013-12-21 ENCOUNTER — Telehealth: Payer: Self-pay | Admitting: *Deleted

## 2013-12-21 ENCOUNTER — Encounter: Payer: Self-pay | Admitting: Internal Medicine

## 2013-12-21 ENCOUNTER — Ambulatory Visit (INDEPENDENT_AMBULATORY_CARE_PROVIDER_SITE_OTHER): Payer: Medicare Other | Admitting: General Practice

## 2013-12-21 VITALS — BP 110/82 | HR 86 | Temp 99.0°F | Wt 294.0 lb

## 2013-12-21 DIAGNOSIS — I350 Nonrheumatic aortic (valve) stenosis: Secondary | ICD-10-CM

## 2013-12-21 DIAGNOSIS — I10 Essential (primary) hypertension: Secondary | ICD-10-CM

## 2013-12-21 DIAGNOSIS — I2510 Atherosclerotic heart disease of native coronary artery without angina pectoris: Secondary | ICD-10-CM

## 2013-12-21 DIAGNOSIS — I739 Peripheral vascular disease, unspecified: Secondary | ICD-10-CM

## 2013-12-21 DIAGNOSIS — I359 Nonrheumatic aortic valve disorder, unspecified: Secondary | ICD-10-CM

## 2013-12-21 DIAGNOSIS — Z5181 Encounter for therapeutic drug level monitoring: Secondary | ICD-10-CM

## 2013-12-21 DIAGNOSIS — E785 Hyperlipidemia, unspecified: Secondary | ICD-10-CM

## 2013-12-21 DIAGNOSIS — I2699 Other pulmonary embolism without acute cor pulmonale: Secondary | ICD-10-CM

## 2013-12-21 LAB — CBC WITH DIFFERENTIAL/PLATELET
BASOS PCT: 0.6 % (ref 0.0–3.0)
Basophils Absolute: 0.1 10*3/uL (ref 0.0–0.1)
Eosinophils Absolute: 0.1 10*3/uL (ref 0.0–0.7)
Eosinophils Relative: 1.2 % (ref 0.0–5.0)
HCT: 52.8 % — ABNORMAL HIGH (ref 39.0–52.0)
Hemoglobin: 18.2 g/dL (ref 13.0–17.0)
Lymphocytes Relative: 20.2 % (ref 12.0–46.0)
Lymphs Abs: 2.4 10*3/uL (ref 0.7–4.0)
MCHC: 34.5 g/dL (ref 30.0–36.0)
MCV: 96 fl (ref 78.0–100.0)
MONO ABS: 0.9 10*3/uL (ref 0.1–1.0)
Monocytes Relative: 7.4 % (ref 3.0–12.0)
NEUTROS PCT: 70.6 % (ref 43.0–77.0)
Neutro Abs: 8.3 10*3/uL — ABNORMAL HIGH (ref 1.4–7.7)
PLATELETS: 233 10*3/uL (ref 150.0–400.0)
RBC: 5.5 Mil/uL (ref 4.22–5.81)
RDW: 13.7 % (ref 11.5–14.6)
WBC: 11.7 10*3/uL — AB (ref 4.5–10.5)

## 2013-12-21 LAB — LIPID PANEL
CHOL/HDL RATIO: 8
Cholesterol: 217 mg/dL — ABNORMAL HIGH (ref 0–200)
HDL: 26.1 mg/dL — AB (ref >39.00)
LDL CALC: 119 mg/dL — AB (ref 0–99)
TRIGLYCERIDES: 362 mg/dL — AB (ref 0.0–149.0)
VLDL: 72.4 mg/dL — ABNORMAL HIGH (ref 0.0–40.0)

## 2013-12-21 LAB — HEPATIC FUNCTION PANEL
ALK PHOS: 58 U/L (ref 39–117)
ALT: 32 U/L (ref 0–53)
AST: 26 U/L (ref 0–37)
Albumin: 3.7 g/dL (ref 3.5–5.2)
BILIRUBIN TOTAL: 0.9 mg/dL (ref 0.3–1.2)
Bilirubin, Direct: 0.1 mg/dL (ref 0.0–0.3)
Total Protein: 8.1 g/dL (ref 6.0–8.3)

## 2013-12-21 LAB — BASIC METABOLIC PANEL
BUN: 14 mg/dL (ref 6–23)
CALCIUM: 9.4 mg/dL (ref 8.4–10.5)
CHLORIDE: 100 meq/L (ref 96–112)
CO2: 25 mEq/L (ref 19–32)
Creatinine, Ser: 0.9 mg/dL (ref 0.4–1.5)
GFR: 96.53 mL/min (ref >60.00)
Glucose, Bld: 130 mg/dL — ABNORMAL HIGH (ref 70–99)
Potassium: 3.8 mEq/L (ref 3.5–5.1)
Sodium: 134 mEq/L — ABNORMAL LOW (ref 135–145)

## 2013-12-21 LAB — TSH: TSH: 1.8 u[IU]/mL (ref 0.35–5.50)

## 2013-12-21 LAB — POCT INR: INR: 3.1

## 2013-12-21 NOTE — Assessment & Plan Note (Signed)
On high dose statin Check annually and adjust as needed

## 2013-12-21 NOTE — Telephone Encounter (Signed)
Hemoglobin 18.2 Dr. Asa Lente made aware.

## 2013-12-21 NOTE — Assessment & Plan Note (Signed)
Severe AS echo 01/2013 - progressive since 2013 ?symptomatic dizziness with exertion and "soft" BP Hold amlodipine recheck echo and arrange cards follow up to consider valve surgery as indicated

## 2013-12-21 NOTE — Assessment & Plan Note (Signed)
Prior cath in 2006 (CT) reviewed Defer to cards to further eval ?need for CABG with valve replacement

## 2013-12-21 NOTE — Progress Notes (Addendum)
Chris Reyes [id number] 12/21/2013  Chief Complaint  Patient presents with  . Follow-up    Subjective  HPI  Past Medical History  Diagnosis Date  . Hypertension   . Hernia   . COPD (chronic obstructive pulmonary disease)   . History of blood clots 2007    in lungs  . CAD (coronary artery disease)     Catheterization, [place], July, 2006, EF 45%,  50% LAD, 50% RCA, 90% marginal, possible collaterals from LAD  to a branch of the PDA, medical therapy recommended  . GI bleed     2006, no significant abnormalities per the patient  . PAD (peripheral artery disease)     Arterial leg Dopplers, April, 2014, normal  . Ejection fraction     EF 45%, catheterization, 2006  //   EF 55%, echo, March, 2012, wall abnormality at the base of the inferior wall  . Aortic stenosis     severe echo 01/2013- cannot rule out bicuspid aortic valve  //   moderately severe, echo,  March, 2012 ( progression since 2009)  . RBBB   . Pulmonary emboli     Significant, 2007, Coumadin therapy  . Warfarin anticoagulation     History pulmonary emboli  . Bradycardia   . Tobacco abuse   . Carotid artery disease     Doppler, March, 2012,  0-39% bilateral  . Dyslipidemia     Past Surgical History  Procedure Laterality Date  . Back surgery  [place]    2 surgeries. disc fusion, pins installed. (L-5-S1)  . Cholecystectomy  1998  . Nerve rotation      left arm  . Tonsillectomy    . Gallbladder surgery      Family History  Problem Relation Age of Onset  . Diabetes Father   . Cancer Father     large cell lung cancer  . Heart disease Mother     History  Substance Use Topics  . Smoking status: Current Every Day Smoker -- 0.25 packs/day    Types: Cigarettes  . Smokeless tobacco: Not on file     Comment: 5-10 cigarettes per day  . Alcohol Use: Yes     Comment: 1 drink a month    Current Outpatient Prescriptions on File Prior to Visit  Medication Sig Dispense Refill  . atorvastatin  (LIPITOR) 80 MG tablet Take 1 tablet (80 mg total) by mouth daily.  30 tablet  11  . Coenzyme Q10 (COQ10) 100 MG CAPS Take 1 capsule by mouth.      . fish oil-omega-3 fatty acids 1000 MG capsule Take 1 g by mouth daily.      . fluticasone (FLONASE) 50 MCG/ACT nasal spray Place 1 spray into both nostrils daily.  16 g  2  . HYDROcodone-acetaminophen (NORCO) 5-325 MG per tablet Take 1 tablet by mouth every 6 (six) hours as needed for pain.  30 tablet  1  . isosorbide mononitrate (IMDUR) 30 MG 24 hr tablet TAKE 1 TABLET EVERY DAY  30 tablet  11  . losartan-hydrochlorothiazide (HYZAAR) 50-12.5 MG per tablet TAKE 1 TABLET EVERY DAY  30 tablet  5  . warfarin (COUMADIN) 5 MG tablet TAKE AS DIRECTED BY ANTICOAGULATION CLINIC  30 tablet  2  . [DISCONTINUED] LISINOPRIL PO Take by mouth daily.         No current facility-administered medications on file prior to visit.     Allergies:No Known Allergies  ROS     Objective  Filed Vitals:   12/21/13 1146  BP: 110/82  Pulse: 86  Temp: 99 F (37.2 C)  TempSrc: Oral  Weight: 294 lb (133.358 kg)  SpO2: 94%    Physical Exam  BP Readings from Last 3 Encounters:  12/21/13 110/82  08/30/13 102/82  02/02/13 116/76    Wt Readings from Last 3 Encounters:  12/21/13 294 lb (133.358 kg)  02/02/13 285 lb 6.4 oz (129.457 kg)  01/15/13 288 lb (130.636 kg)    Lab Results  Component Value Date   WBC 11.7* 12/21/2013   HGB 18.2* 12/21/2013   HCT 52.8* 12/21/2013   PLT 233.0 12/21/2013   GLUCOSE 130* 12/21/2013   CHOL 217* 12/21/2013   TRIG 362.0* 12/21/2013   HDL 26.10* 12/21/2013   LDLDIRECT 138.0 01/26/2009   LDLCALC 119* 12/21/2013   ALT 32 12/21/2013   AST 26 12/21/2013   NA 134* 12/21/2013   K 3.8 12/21/2013   CL 100 12/21/2013   CREATININE 0.9 12/21/2013   BUN 14 12/21/2013   CO2 25 12/21/2013   TSH 1.80 12/21/2013   PSA 4.70* 11/06/2010   INR 3.1 12/21/2013    Dg Chest 2 View  08/30/2013   CLINICAL DATA:  Cough and congestion  EXAM: CHEST  2  VIEW  COMPARISON:  05/25/2009  FINDINGS: Cardiac shadow is stable. The lungs are well aerated bilaterally with mild hyperinflation. No focal infiltrate or sizable effusion is seen. No acute bony abnormality is noted.  IMPRESSION: COPD without acute abnormality.   Electronically Signed   By: Inez Catalina M.D.   On: 08/30/2013 11:21       Assessment and Plan  Peripheral Artery Disease: Mentioned claudication when walking. Has a history of smoking and diabetes, combined with has age being in the range of 50-69, which is a risk factor for PAD, and he has already been diagnosed with this. An ankle brachial index (ABI), should be done to compare the blood pressure in the arm to the blood pressure in the leg <.90 is diagnostic for the disease which causes the subsequent arterial stenosis or occlusion.  If ABI is normal (.91 to 1.30), exercise testing should be done (if exercise treadmill test ABI abnormal, diagnostic for PAD). If ABI still remains normal, vascular imaging study (ateriography with contrast) may be done with vascular specialist to rule out other vascular etiologies (such as thromboembolism from aortic plaque that can be found with CAD).He also has known CAD, which is an  independnt rf for PAD and could lead to other vascular pathology for claudication when walking. This ratio may be increased due to lack of blood flow to the musculature that is lower than the metabolic needs for the muscle in the lower extremities that occurs with atherosclerotic build up on the arterial walls of the lower extremities. The reduced blood flow in the arteries (called arterial insufficiency) leads to pain in the muscles, and the class "claudication when walking". Patient should see a podiatrist, vascular surgeon (or specialist), in addition to his PCP to follow his PAD to reduce the chances of further complications.   Severe Aortic Stenosis: Harsh, systolic murmur heard at base of heart in 2nd RICS w/ a late peaking  murmur, as opposed to an early peaking murmur, which is more suggestive of severe aortic stenosis (stage D).  Refer to cardiologist for order of transthoracic echocardiogram w/ Doppler echocardiography. If aortic valve area <1.0 cm^2, jet velocity over 4.61m/s, and mean transvalvular gradient > or = to 15mmHg, it will  be considered severe aortic stenosis. Exact stage ( i.e. Stage D1, D2, or D3) will be determined through echocardiogram.Then consider aortic valve replacement. Symptoms do not usually present unless aortic stenosis is severe, and patient presented with sxs classic of severe aortic stenosis, including dizziness and dyspnea.   Treat underlying dyslipidemia, hypertension, and diabetes, and talk about cessation of smoking.   Return in about 6 weeks (around 02/01/2014) for recheck and medication adjustment (if needed). Berenice Bouton, Student-PA

## 2013-12-21 NOTE — Progress Notes (Signed)
Pre visit review using our clinic review tool, if applicable. No additional management support is needed unless otherwise documented below in the visit note. 

## 2013-12-21 NOTE — Assessment & Plan Note (Signed)
Normal ABI 12/2012 but increasing claudication symptoms with min activity. Multiple risks for PAD reviewed Refer for new ABI now

## 2013-12-21 NOTE — Patient Instructions (Signed)
It was good to see you today.  We have reviewed your prior records including labs and tests today  Test(s) ordered today. Your results will be released to [place] (or called to you) after review, usually within 72hours after test completion. If any changes need to be made, you will be notified at that same time.  Medications reviewed and updated Stop amlodipine for now - no other changes recommended at this time.  we'll make referral for repeat echocardiogram, repeat leg ABI for blood flow and appointment with dr Ron Parker . Our office will contact you regarding appointment(s) once made.  Please schedule followup in 6-8 weeks for continued review, call sooner if problems.

## 2013-12-21 NOTE — Progress Notes (Signed)
Subjective:    Patient ID: Chris Reyes, male    DOB: 28-Dec-1950, 63 y.o.   MRN: [id number]  HPI  Prior pt for [facility], transfer to me for new PCP  Patient here for follow up Reviewed chronic medical issues and interval medical events  Aortic stenosis. Known severe disease with last echocardiogram June 2014. Reports told by cardiology to plan for aortic valve replacement in coming months, but has been lost to followup. Reports increasing lightheadedness/presyncope but no frank syncope. Concerned about low blood pressure, exacerbated by compliance with usual antihypertensive regimen  CAD. Catheterization 2006 in [place] reviewed. Denies frank anginal symptoms such as chest pain, nausea, diaphoresis. No chest tightness, but chronic dyspnea on exertion. Compliant with all medications as prescribed  PE. On chronic anticoagulation since then 10 2006. Follows with Coumadin clinic for same  COPD. Ongoing tobacco abuse. Reports decreasing use and cessation efforts, currently 5 cigarettes per day. Denies daily cough but endorses chronic dyspnea on exertion  Chronic low back pain. Prior surgical intervention for same. Uses Norco as needed -bilateral leg pain, worse with exertion, improved with rest. Not positional  Past Medical History  Diagnosis Date  . Hypertension   . Hernia   . COPD (chronic obstructive pulmonary disease)   . History of blood clots 2007    in lungs  . CAD (coronary artery disease)     Catheterization, [place], July, 2006, EF 45%,  50% LAD, 50% RCA, 90% marginal, possible collaterals from LAD  to a branch of the PDA, medical therapy recommended  . GI bleed     2006, no significant abnormalities per the patient  . PAD (peripheral artery disease)     Arterial leg Dopplers, April, 2014, normal  . Ejection fraction     EF 45%, catheterization, 2006  //   EF 55%, echo, March, 2012, wall abnormality at the base of the inferior wall  . Aortic stenosis     severe echo  01/2013- cannot rule out bicuspid aortic valve  //   moderately severe, echo,  March, 2012 ( progression since 2009)  . RBBB   . Pulmonary emboli     Significant, 2007, Coumadin therapy  . Warfarin anticoagulation     History pulmonary emboli  . Bradycardia   . Tobacco abuse   . Carotid artery disease     Doppler, March, 2012,  0-39% bilateral  . Dyslipidemia    Family History  Problem Relation Age of Onset  . Diabetes Father   . Cancer Father     large cell lung cancer  . Heart disease Mother    History  Substance Use Topics  . Smoking status: Current Every Day Smoker -- 0.25 packs/day    Types: Cigarettes  . Smokeless tobacco: Not on file     Comment: 5-10 cigarettes per day  . Alcohol Use: Yes     Comment: 1 drink a month    Review of Systems  Constitutional: Positive for fatigue (w/ exertion). Negative for fever, diaphoresis and unexpected weight change.  Respiratory: Positive for shortness of breath (exertional). Negative for cough.   Cardiovascular: Negative for chest pain, palpitations and leg swelling.       Leg pain with walking, improved w/ rest  Gastrointestinal: Negative for nausea, abdominal pain, diarrhea, constipation, blood in stool and anal bleeding.  Musculoskeletal: Positive for back pain (chronic). Negative for gait problem, joint swelling and myalgias.  Neurological: Positive for light-headedness (episodic, exertional). Negative for dizziness, seizures, syncope, speech difficulty,  weakness and numbness.  All other systems reviewed and are negative.      Objective:   Physical Exam  BP 110/82  Pulse 86  Temp(Src) 99 F (37.2 C) (Oral)  Wt 294 lb (133.358 kg)  SpO2 94% Wt Readings from Last 3 Encounters:  12/21/13 294 lb (133.358 kg)  02/02/13 285 lb 6.4 oz (129.457 kg)  01/15/13 288 lb (130.636 kg)   Constitutional: he is obese, but appears well-developed and well-nourished. No distress.  Neck: Normal range of motion. Neck supple. No JVD  present. No thyromegaly present.  Cardiovascular: Normal rate, regular rhythm and normal heart sounds.  3/6 holosystolic murmur heard. No BLE edema. Pulmonary/Chest: Effort normal and breath sounds diminished at bases. No respiratory distress. he has no wheezes.  Psychiatric: he has a normal mood and affect. His behavior is normal. Judgment and thought content normal.   Lab Results  Component Value Date   WBC 13.0* 11/06/2010   HGB 18.3 Critical result called to [place] on 11/06/2010 3:26 PM by Scales, [place]. Results were read back to caller.* 11/06/2010   HCT 52.2* 11/06/2010   PLT 233.0 11/06/2010   GLUCOSE 124* 08/04/2012   CHOL 117 08/04/2012   TRIG 84.0 08/04/2012   HDL 29.20* 08/04/2012   LDLDIRECT 138.0 01/26/2009   LDLCALC 71 08/04/2012   ALT 25 11/19/2010   AST 26 11/19/2010   NA 135 08/04/2012   K 3.8 08/04/2012   CL 101 08/04/2012   CREATININE 0.9 08/04/2012   BUN 12 08/04/2012   CO2 27 08/04/2012   TSH 1.60 11/06/2010   PSA 4.70* 11/06/2010   INR 3.1 12/21/2013    Dg Chest 2 View  08/30/2013   CLINICAL DATA:  Cough and congestion  EXAM: CHEST  2 VIEW  COMPARISON:  05/25/2009  FINDINGS: Cardiac shadow is stable. The lungs are well aerated bilaterally with mild hyperinflation. No focal infiltrate or sizable effusion is seen. No acute bony abnormality is noted.  IMPRESSION: COPD without acute abnormality.   Electronically Signed   By: Inez Catalina M.D.   On: 08/30/2013 11:21       Assessment & Plan:   Problem List Items Addressed This Visit   Aortic stenosis - Primary     Severe AS echo 01/2013 - progressive since 2013 ?symptomatic dizziness with exertion and "soft" BP Hold amlodipine recheck echo and arrange cards follow up to consider valve surgery as indicated    Relevant Orders      2D Echocardiogram without contrast      Hepatic function panel      Ambulatory referral to Cardiology   CAD (coronary artery disease)     Prior cath in 2006 (CT) reviewed Defer to cards to further eval ?need  for CABG with valve replacement    Relevant Orders      CBC with Differential      Hepatic function panel      Lipid panel      TSH      Ambulatory referral to Cardiology   HYPERLIPIDEMIA     On high dose statin Check annually and adjust as needed    Relevant Orders      Lower Extremity Arterial Doppler Bilateral      Hepatic function panel      Lipid panel      TSH      Ambulatory referral to Cardiology   HYPERTENSION      BP Readings from Last 3 Encounters:  12/21/13 110/82  08/30/13 102/82  02/02/13 116/76   Hold amlodipine given sift BP Continue ARB (w/ hctz) and imdur given CAD    Relevant Orders      Lower Extremity Arterial Doppler Bilateral      Basic metabolic panel      Hepatic function panel      Lipid panel      Ambulatory referral to Cardiology   PAD (peripheral artery disease)     Normal ABI 12/2012 but increasing claudication symptoms with min activity. Multiple risks for PAD reviewed Refer for new ABI now    Relevant Orders      Lower Extremity Arterial Doppler Bilateral      Hepatic function panel      Lipid panel      Ambulatory referral to Cardiology     Time spent with pt today 40 minutes, greater than 50% time spent counseling patient on CAD, AS, claudication, need for smoking cessation and low normal BP without meds -also medication review and review of prior records

## 2013-12-21 NOTE — Assessment & Plan Note (Signed)
BP Readings from Last 3 Encounters:  12/21/13 110/82  08/30/13 102/82  02/02/13 116/76   Hold amlodipine given sift BP Continue ARB (w/ hctz) and imdur given CAD

## 2013-12-22 ENCOUNTER — Telehealth: Payer: Self-pay | Admitting: Internal Medicine

## 2013-12-22 NOTE — Telephone Encounter (Signed)
Chronic - no action needed thanks

## 2013-12-22 NOTE — Telephone Encounter (Signed)
Relevant patient education assigned to patient using Emmi. ° °

## 2014-01-03 ENCOUNTER — Other Ambulatory Visit (HOSPITAL_COMMUNITY): Payer: Self-pay | Admitting: Internal Medicine

## 2014-01-03 ENCOUNTER — Ambulatory Visit (HOSPITAL_COMMUNITY): Payer: Medicare Other | Attending: Cardiology | Admitting: Cardiology

## 2014-01-03 ENCOUNTER — Ambulatory Visit (HOSPITAL_BASED_OUTPATIENT_CLINIC_OR_DEPARTMENT_OTHER): Payer: Medicare Other | Admitting: Cardiology

## 2014-01-03 DIAGNOSIS — I10 Essential (primary) hypertension: Secondary | ICD-10-CM

## 2014-01-03 DIAGNOSIS — I359 Nonrheumatic aortic valve disorder, unspecified: Secondary | ICD-10-CM | POA: Insufficient documentation

## 2014-01-03 DIAGNOSIS — I350 Nonrheumatic aortic (valve) stenosis: Secondary | ICD-10-CM

## 2014-01-03 DIAGNOSIS — E785 Hyperlipidemia, unspecified: Secondary | ICD-10-CM

## 2014-01-03 DIAGNOSIS — I739 Peripheral vascular disease, unspecified: Secondary | ICD-10-CM

## 2014-01-03 NOTE — Progress Notes (Signed)
Echo performed. 

## 2014-01-03 NOTE — Progress Notes (Signed)
LEA Doppler/ABI performed 

## 2014-01-04 ENCOUNTER — Encounter: Payer: Self-pay | Admitting: Cardiology

## 2014-01-04 ENCOUNTER — Encounter: Payer: Self-pay | Admitting: Internal Medicine

## 2014-01-05 ENCOUNTER — Ambulatory Visit (INDEPENDENT_AMBULATORY_CARE_PROVIDER_SITE_OTHER): Payer: Medicare Other | Admitting: Cardiology

## 2014-01-05 ENCOUNTER — Encounter: Payer: Self-pay | Admitting: Cardiology

## 2014-01-05 VITALS — BP 132/88 | HR 69 | Ht 71.0 in | Wt 294.0 lb

## 2014-01-05 DIAGNOSIS — I10 Essential (primary) hypertension: Secondary | ICD-10-CM

## 2014-01-05 DIAGNOSIS — I498 Other specified cardiac arrhythmias: Secondary | ICD-10-CM

## 2014-01-05 DIAGNOSIS — R9430 Abnormal result of cardiovascular function study, unspecified: Secondary | ICD-10-CM

## 2014-01-05 DIAGNOSIS — F17200 Nicotine dependence, unspecified, uncomplicated: Secondary | ICD-10-CM

## 2014-01-05 DIAGNOSIS — E785 Hyperlipidemia, unspecified: Secondary | ICD-10-CM

## 2014-01-05 DIAGNOSIS — Z7901 Long term (current) use of anticoagulants: Secondary | ICD-10-CM

## 2014-01-05 DIAGNOSIS — I359 Nonrheumatic aortic valve disorder, unspecified: Secondary | ICD-10-CM

## 2014-01-05 DIAGNOSIS — I2510 Atherosclerotic heart disease of native coronary artery without angina pectoris: Secondary | ICD-10-CM

## 2014-01-05 DIAGNOSIS — I4510 Unspecified right bundle-branch block: Secondary | ICD-10-CM

## 2014-01-05 DIAGNOSIS — I739 Peripheral vascular disease, unspecified: Secondary | ICD-10-CM

## 2014-01-05 DIAGNOSIS — R0989 Other specified symptoms and signs involving the circulatory and respiratory systems: Secondary | ICD-10-CM

## 2014-01-05 DIAGNOSIS — I350 Nonrheumatic aortic (valve) stenosis: Secondary | ICD-10-CM

## 2014-01-05 NOTE — Assessment & Plan Note (Signed)
The patient has known coronary disease. He's not having any chest pain. No further workup at this time.

## 2014-01-05 NOTE — Assessment & Plan Note (Signed)
Patient tells me that he is stop smoking. He says that he had only smoked a few cigarettes per day but has not smoked for 4 days. He does have an odor of cigarette smoke.

## 2014-01-05 NOTE — Assessment & Plan Note (Signed)
His leg pain is not a vascular issue in his legs. Doppler shows this.

## 2014-01-05 NOTE — Assessment & Plan Note (Signed)
Resting heart rate today is 64. No further evaluation.

## 2014-01-05 NOTE — Progress Notes (Signed)
Patient ID: Chris Reyes, male   DOB: Feb 19, 1951, 63 y.o.   MRN: [id number]    HPI  Patient is seen today for followup coronary disease and severe aortic stenosis. I saw him last May, 2014. We know that he has severe aortic stenosis. I have spoken to him before about strongly considering surgery. Up to this point he has been hesitant. Fortunately he has not had syncope, congestive heart failure, or chest pain. I suspect that his overall activities are limited, but he says that he is able to do the activities that he wants. He does have some leg discomfort. He has had leg Dopplers over the years that showed no significant abnormality. The most recent was within the past month. He is on Coumadin for other reasons. His most recent echo shows once again that he has severe disease. His ejection fraction remains in the normal range. Peak velocity across the valve is 4.1 m/s. Peak gradient is 67 mm mercury and mean gradient 42 mm of mercury. All of these results meet criteria for recommendation for aortic valve replacement. However this continues to be a judgment call, and the patient wants to continue to wait.  No Known Allergies  Current Outpatient Prescriptions  Medication Sig Dispense Refill  . atorvastatin (LIPITOR) 80 MG tablet Take 1 tablet (80 mg total) by mouth daily.  30 tablet  11  . Coenzyme Q10 (COQ10) 100 MG CAPS Take 1 capsule by mouth.      . fish oil-omega-3 fatty acids 1000 MG capsule Take 1 g by mouth daily.      . fluticasone (FLONASE) 50 MCG/ACT nasal spray Place 1 spray into both nostrils daily.  16 g  2  . HYDROcodone-acetaminophen (NORCO/VICODIN) 5-325 MG per tablet Take 1 tablet by mouth as needed.      . isosorbide mononitrate (IMDUR) 30 MG 24 hr tablet TAKE 1 TABLET EVERY DAY  30 tablet  11  . losartan-hydrochlorothiazide (HYZAAR) 50-12.5 MG per tablet TAKE 1 TABLET EVERY DAY  30 tablet  5  . warfarin (COUMADIN) 5 MG tablet TAKE AS DIRECTED BY ANTICOAGULATION CLINIC  30 tablet   2  . [DISCONTINUED] LISINOPRIL PO Take by mouth daily.         No current facility-administered medications for this visit.    History   Social History  . Marital Status: Divorced    Spouse Name: N/A    Number of Children: N/A  . Years of Education: N/A   Occupational History  . Not on file.   Social History Main Topics  . Smoking status: Current Every Day Smoker -- 0.25 packs/day    Types: Cigarettes  . Smokeless tobacco: Not on file     Comment: 5-10 cigarettes per day  . Alcohol Use: Yes     Comment: 1 drink a month  . Drug Use: No  . Sexual Activity: Not on file   Other Topics Concern  . 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    Family History  Problem Relation Age of Onset  . Diabetes Father   . Cancer Father     large cell lung cancer  . Heart disease Mother     Past Medical History  Diagnosis Date  . Hypertension   . Hernia   . COPD (chronic obstructive pulmonary disease)   . History of blood clots 2007    in lungs  . CAD (coronary artery disease)     Catheterization, [place], July, 2006, EF 45%,  50% LAD, 50% RCA, 90% marginal, possible collaterals from LAD  to a branch of the PDA, medical therapy recommended  . GI bleed     2006, no significant abnormalities per the patient  . PAD (peripheral artery disease)     Arterial leg Dopplers, April, 2014, normal  . Ejection fraction     EF 45%, catheterization, 2006  //   EF 55%, echo, March, 2012, wall abnormality at the base of the inferior wall  . Aortic stenosis     severe echo 01/2013- cannot rule out bicuspid aortic valve  //   moderately severe, echo,  March, 2012 ( progression since 2009)  . RBBB   . Pulmonary emboli     Significant, 2007, Coumadin therapy  .  Warfarin anticoagulation     History pulmonary emboli  . Bradycardia   . Tobacco abuse   . Carotid artery disease     Doppler, March, 2012,  0-39% bilateral  . Dyslipidemia     Past Surgical History  Procedure Laterality Date  . Back surgery  [place]    2 surgeries. disc fusion, pins installed. (L-5-S1)  . Cholecystectomy  1998  . Nerve rotation      left arm  . Tonsillectomy    . Gallbladder surgery      Patient Active Problem List   Diagnosis Date Noted  . Encounter for therapeutic drug monitoring 09/28/2013  . Long term (current) use of anticoagulants 09/28/2013  . Other pulmonary embolism and infarction 09/28/2013  . Obesity, Class II, BMI 35-39.9 02/02/2013  . Healthcare maintenance 02/02/2013  . PAD (peripheral artery disease)   . CAD (coronary artery disease)   . GI bleed   . Ejection fraction   . Aortic stenosis   . Pulmonary emboli   . Warfarin anticoagulation   . Carotid artery disease   . RBBB   . HERNIA, UMBILICAL 19/62/2297  . TOBACCO ABUSE 09/25/2010  . HYPERTENSION 09/24/2010  . COPD 09/24/2010  . BRADYCARDIA 01/26/2009  . HYPERLIPIDEMIA 06/20/2007  . CARPAL TUNNEL SYNDROME 06/20/2007    ROS   Patient denies fever, chills, headache, sweats, rash, change in vision, change in hearing, chest pain, cough, nausea vomiting, urinary symptoms. All other systems are reviewed and are negative.  PHYSICAL EXAM  The patient is stable. He is overweight. Head is atraumatic. He has poor dentition. Sclera and conjunctiva are normal. There is no jugulovenous distention. Patient has a radiated murmur of aortic stenosis heard bilaterally in the neck. Lungs reveal a few scattered rhonchi. There is no respiratory distress. Cardiac exam reveals S1 and S2. There is a 3/6 crescendo decrescendo high-pitched systolic murmur compatible with his aortic stenosis. Abdomen is protuberant. There is no peripheral edema. There are no musculoskeletal deformities. There are no skin  rashes.  Filed Vitals:   01/05/14 0843  BP: 132/88  Pulse: 69  Height: 5\' 11"  (1.803 m)  Weight: 294 lb (133.358 kg)   EKG is done today and reviewed by me and compared to the prior tracing. There is normal sinus rhythm. There is right bundle branch block that is old. There is no significant change.  ASSESSMENT & PLAN

## 2014-01-05 NOTE — Assessment & Plan Note (Signed)
Patient is on guidelines directed therapy for his lipids.

## 2014-01-05 NOTE — Assessment & Plan Note (Signed)
Patient continues on Coumadin for his history of pulmonary emboli.

## 2014-01-05 NOTE — Assessment & Plan Note (Signed)
Blood pressure is controlled. No change in therapy. 

## 2014-01-05 NOTE — Assessment & Plan Note (Signed)
Patient is right bundle-branch block. This has been seen in the past. No further workup.

## 2014-01-05 NOTE — Assessment & Plan Note (Addendum)
Patient has severe aortic stenosis. His most recent echo done Jan 03, 2014 shows that LV function remains the same with an ejection fraction of 60%. Peak velocity remains similar at 4.1 m/s. Peak gradient of 67 mm mercury and mean gradient 42 mmHg remains similar to the past. These findings are still consistent with severe aortic stenosis. They meet criteria for recommending surgery. I've talked with the patient about this again. He does not have any classic symptoms. I told him that I would still recommend surgery at this time. He is hesitant but getting used to the idea that he will need surgery as time goes on. He knows to contact me if he has any chest pain, shortness of breath, or syncope. He has agreed to be sure that he will see me again no longer than 6 months. We will follow him very carefully.  As part of today's evaluation I spent greater than 25 minutes with his total care. More than half of this time is spent in direct contact with him. I've had a very careful and long discussion with him about his aortic stenosis.

## 2014-01-05 NOTE — Patient Instructions (Signed)
**Note De-identified  Obfuscation** Your physician recommends that you continue on your current medications as directed. Please refer to the Current Medication list given to you today.  Your physician wants you to follow-up in: 6 months. You will receive a reminder letter in the mail two months in advance. If you don't receive a letter, please call our office to schedule the follow-up appointment.  

## 2014-01-05 NOTE — Assessment & Plan Note (Signed)
His current ejection fraction remains normal at 60% by echo.

## 2014-01-18 ENCOUNTER — Ambulatory Visit: Payer: Medicare Other | Admitting: Internal Medicine

## 2014-01-21 ENCOUNTER — Ambulatory Visit (INDEPENDENT_AMBULATORY_CARE_PROVIDER_SITE_OTHER): Payer: Medicare Other | Admitting: General Practice

## 2014-01-21 DIAGNOSIS — Z5181 Encounter for therapeutic drug level monitoring: Secondary | ICD-10-CM

## 2014-01-21 DIAGNOSIS — I2699 Other pulmonary embolism without acute cor pulmonale: Secondary | ICD-10-CM

## 2014-01-21 LAB — POCT INR: INR: 2.9

## 2014-01-21 NOTE — Progress Notes (Signed)
Pre visit review using our clinic review tool, if applicable. No additional management support is needed unless otherwise documented below in the visit note. 

## 2014-02-01 ENCOUNTER — Encounter: Payer: Self-pay | Admitting: Internal Medicine

## 2014-02-01 ENCOUNTER — Other Ambulatory Visit (INDEPENDENT_AMBULATORY_CARE_PROVIDER_SITE_OTHER): Payer: Medicare Other

## 2014-02-01 ENCOUNTER — Ambulatory Visit (INDEPENDENT_AMBULATORY_CARE_PROVIDER_SITE_OTHER): Payer: Medicare Other | Admitting: Internal Medicine

## 2014-02-01 VITALS — BP 130/82 | HR 64 | Temp 98.3°F | Wt 287.2 lb

## 2014-02-01 DIAGNOSIS — G609 Hereditary and idiopathic neuropathy, unspecified: Secondary | ICD-10-CM

## 2014-02-01 DIAGNOSIS — I10 Essential (primary) hypertension: Secondary | ICD-10-CM

## 2014-02-01 DIAGNOSIS — E785 Hyperlipidemia, unspecified: Secondary | ICD-10-CM

## 2014-02-01 DIAGNOSIS — G629 Polyneuropathy, unspecified: Secondary | ICD-10-CM

## 2014-02-01 DIAGNOSIS — I2510 Atherosclerotic heart disease of native coronary artery without angina pectoris: Secondary | ICD-10-CM

## 2014-02-01 DIAGNOSIS — I350 Nonrheumatic aortic (valve) stenosis: Secondary | ICD-10-CM

## 2014-02-01 DIAGNOSIS — I359 Nonrheumatic aortic valve disorder, unspecified: Secondary | ICD-10-CM

## 2014-02-01 LAB — VITAMIN B12: VITAMIN B 12: 857 pg/mL (ref 211–911)

## 2014-02-01 LAB — HEMOGLOBIN A1C: HEMOGLOBIN A1C: 6.3 % (ref 4.6–6.5)

## 2014-02-01 MED ORDER — LOSARTAN POTASSIUM 50 MG PO TABS: 50.0000 mg | ORAL_TABLET | Freq: Every day | ORAL | Status: DC

## 2014-02-01 NOTE — Assessment & Plan Note (Signed)
Severe AS echo 01/2013 - progressive since 2013, but stable 12/2013 ?symptomatic dizziness with exertion related to same Considering surgical intervention after cards follow up 01/2014 recheck echo in 6 mo and arrange surgical consult to consider valve surgery as indicated Reviewed interval changes in depth today

## 2014-02-01 NOTE — Progress Notes (Signed)
Subjective:    Patient ID: Chris Reyes, male    DOB: 30-Sep-1950, 63 y.o.   MRN: [id number]  HPI  Patient here for follow up Reviewed chronic medical issues and interval medical events  Past Medical History  Diagnosis Date  . Hypertension   . Hernia   . COPD (chronic obstructive pulmonary disease)   . History of blood clots 2007    in lungs  . CAD (coronary artery disease)     Catheterization, [place], July, 2006, EF 45%,  50% LAD, 50% RCA, 90% marginal, possible collaterals from LAD  to a branch of the PDA, medical therapy recommended  . GI bleed     2006, no significant abnormalities per the patient  . PAD (peripheral artery disease)     Arterial leg Dopplers, April, 2014, normal  . Ejection fraction     EF 45%, catheterization, 2006  //   EF 55%, echo, March, 2012, wall abnormality at the base of the inferior wall  . Aortic stenosis     severe echo 01/2013- cannot rule out bicuspid aortic valve  //   moderately severe, echo,  March, 2012 ( progression since 2009)  . RBBB   . Pulmonary emboli     Significant, 2007, Coumadin therapy  . Warfarin anticoagulation     History pulmonary emboli  . Bradycardia   . Tobacco abuse   . Carotid artery disease     Doppler, March, 2012,  0-39% bilateral  . Dyslipidemia     Review of Systems  Constitutional: Negative for fever and fatigue.  Respiratory: Negative for cough and shortness of breath.   Cardiovascular: Negative for chest pain, palpitations and leg swelling.  Musculoskeletal: Positive for gait problem (BLE leg pain with exertion but denies back pain). Negative for arthralgias and joint swelling.  Neurological: Positive for dizziness (episodic, rest and exertion). Negative for syncope, weakness and light-headedness.       Objective:   Physical Exam  BP 130/82  Pulse 64  Temp(Src) 98.3 F (36.8 C) (Oral)  Wt 287 lb 3.2 oz (130.273 kg)  SpO2 97% Wt Readings from Last 3 Encounters:  02/01/14 287 lb 3.2 oz  (130.273 kg)  01/05/14 294 lb (133.358 kg)  12/21/13 294 lb (133.358 kg)   Constitutional: he is obese, but appears well-developed and well-nourished. No distress.  Neck: Normal range of motion. Neck supple. No JVD present. No thyromegaly present.  Cardiovascular: Normal rate, regular rhythm and normal heart sounds.  3/6 systolic murmur heard. No BLE edema. Pulmonary/Chest: Effort normal and breath sounds normal. No respiratory distress. he has no wheezes.  Psychiatric: he has a normal mood and affect. His behavior is normal. Judgment and thought content normal.   Lab Results  Component Value Date   WBC 11.7* 12/21/2013   HGB 18.2* 12/21/2013   HCT 52.8* 12/21/2013   PLT 233.0 12/21/2013   GLUCOSE 130* 12/21/2013   CHOL 217* 12/21/2013   TRIG 362.0* 12/21/2013   HDL 26.10* 12/21/2013   LDLDIRECT 138.0 01/26/2009   LDLCALC 119* 12/21/2013   ALT 32 12/21/2013   AST 26 12/21/2013   NA 134* 12/21/2013   K 3.8 12/21/2013   CL 100 12/21/2013   CREATININE 0.9 12/21/2013   BUN 14 12/21/2013   CO2 25 12/21/2013   TSH 1.80 12/21/2013   PSA 4.70* 11/06/2010   INR 2.9 01/21/2014   Lab Results  Component Value Date   VITAMINB12 656 12/03/2007    Dg Chest 2 View  08/30/2013  CLINICAL DATA:  Cough and congestion  EXAM: CHEST  2 VIEW  COMPARISON:  05/25/2009  FINDINGS: Cardiac shadow is stable. The lungs are well aerated bilaterally with mild hyperinflation. No focal infiltrate or sizable effusion is seen. No acute bony abnormality is noted.  IMPRESSION: COPD without acute abnormality.   Electronically Signed   By: Inez Catalina M.D.   On: 08/30/2013 11:21       Assessment & Plan:   Problem List Items Addressed This Visit   Aortic stenosis     Severe AS echo 01/2013 - progressive since 2013, but stable 12/2013 ?symptomatic dizziness with exertion related to same Considering surgical intervention after cards follow up 01/2014 recheck echo in 6 mo and arrange surgical consult to consider valve surgery as  indicated Reviewed interval changes in depth today    Relevant Medications      losartan (COZAAR) tablet   HYPERTENSION - Primary      BP Readings from Last 3 Encounters:  02/01/14 130/82  01/05/14 132/88  12/21/13 110/82   stopped amlodipine 12/2013 because of "soft" BP Improved but still feels "light headed" on occassion will stop hctz, but continue ARB and imdur given CAD    Relevant Medications      losartan (COZAAR) tablet   Peripheral neuropathy      Bilateral leg pain, normal ABIs may 2015 reviewed History of extensive lumbar surgery, last intervention in 2006 by history Suspect spinal stenosis with radiculopathy, but good strength on exam today Check A1c and B12 to exclude other causes of peripheral neuropathy Patient declines need for MRI given lack of back pain and declines consideration of potential back intervention for leg symptoms until after cardiac issues with aortic stenosis have been resolved    Relevant Orders      Hemoglobin A1c      Vitamin B12

## 2014-02-01 NOTE — Assessment & Plan Note (Signed)
BP Readings from Last 3 Encounters:  02/01/14 130/82  01/05/14 132/88  12/21/13 110/82   stopped amlodipine 12/2013 because of "soft" BP Improved but still feels "light headed" on occassion will stop hctz, but continue ARB and imdur given CAD

## 2014-02-01 NOTE — Assessment & Plan Note (Signed)
Bilateral leg pain, normal ABIs may 2015 reviewed History of extensive lumbar surgery, last intervention in 2006 by history Suspect spinal stenosis with radiculopathy, but good strength on exam today Check A1c and B12 to exclude other causes of peripheral neuropathy Patient declines need for MRI given lack of back pain and declines consideration of potential back intervention for leg symptoms until after cardiac issues with aortic stenosis have been resolved

## 2014-02-01 NOTE — Progress Notes (Signed)
Pre visit review using our clinic review tool, if applicable. No additional management support is needed unless otherwise documented below in the visit note. 

## 2014-02-01 NOTE — Patient Instructions (Signed)
It was good to see you today.  We have reviewed your prior records including labs and tests today  Test(s) ordered today. Your results will be released to [facility] (or called to you) after review, usually within 72hours after test completion. If any changes need to be made, you will be notified at that same time.  Medications reviewed and updated Change losartan-hct comnbo to losartan only (50mg  daily) - no other changes recommended at this time.  Your prescription(s) have been submitted to your pharmacy. Please take as directed and contact our office if you believe you are having problem(s) with the medication(s).  Please schedule followup in 3-4 months for continued review, call sooner if problems.

## 2014-02-15 ENCOUNTER — Ambulatory Visit (INDEPENDENT_AMBULATORY_CARE_PROVIDER_SITE_OTHER): Payer: Medicare Other | Admitting: General Practice

## 2014-02-15 DIAGNOSIS — Z5181 Encounter for therapeutic drug level monitoring: Secondary | ICD-10-CM

## 2014-02-15 DIAGNOSIS — I2699 Other pulmonary embolism without acute cor pulmonale: Secondary | ICD-10-CM

## 2014-02-15 LAB — POCT INR: INR: 3

## 2014-02-15 NOTE — Progress Notes (Signed)
Pre visit review using our clinic review tool, if applicable. No additional management support is needed unless otherwise documented below in the visit note. 

## 2014-02-23 ENCOUNTER — Other Ambulatory Visit: Payer: Self-pay | Admitting: *Deleted

## 2014-02-23 MED ORDER — WARFARIN SODIUM 5 MG PO TABS: ORAL_TABLET | ORAL | Status: DC

## 2014-03-08 ENCOUNTER — Other Ambulatory Visit: Payer: Self-pay | Admitting: General Practice

## 2014-03-08 MED ORDER — WARFARIN SODIUM 5 MG PO TABS: ORAL_TABLET | ORAL | Status: DC

## 2014-03-15 ENCOUNTER — Ambulatory Visit (INDEPENDENT_AMBULATORY_CARE_PROVIDER_SITE_OTHER): Payer: Medicare Other | Admitting: Family Medicine

## 2014-03-15 DIAGNOSIS — Z5181 Encounter for therapeutic drug level monitoring: Secondary | ICD-10-CM

## 2014-03-15 DIAGNOSIS — I2699 Other pulmonary embolism without acute cor pulmonale: Secondary | ICD-10-CM

## 2014-03-15 LAB — POCT INR: INR: 2

## 2014-04-06 ENCOUNTER — Other Ambulatory Visit: Payer: Self-pay

## 2014-04-06 MED ORDER — ISOSORBIDE MONONITRATE ER 30 MG PO TB24: ORAL_TABLET | ORAL | Status: DC

## 2014-04-12 ENCOUNTER — Observation Stay (HOSPITAL_COMMUNITY): Payer: Medicare Other

## 2014-04-12 ENCOUNTER — Emergency Department (HOSPITAL_COMMUNITY): Payer: Medicare Other

## 2014-04-12 ENCOUNTER — Encounter (HOSPITAL_COMMUNITY): Payer: Self-pay | Admitting: Emergency Medicine

## 2014-04-12 ENCOUNTER — Observation Stay (HOSPITAL_COMMUNITY)
Admission: EM | Admit: 2014-04-12 | Discharge: 2014-04-13 | Disposition: A | Discharge status: Home / Self Care | Payer: Medicare Other | Attending: Internal Medicine | Admitting: Internal Medicine

## 2014-04-12 DIAGNOSIS — K429 Umbilical hernia without obstruction or gangrene: Secondary | ICD-10-CM

## 2014-04-12 DIAGNOSIS — F17200 Nicotine dependence, unspecified, uncomplicated: Secondary | ICD-10-CM | POA: Insufficient documentation

## 2014-04-12 DIAGNOSIS — J449 Chronic obstructive pulmonary disease, unspecified: Secondary | ICD-10-CM | POA: Diagnosis not present

## 2014-04-12 DIAGNOSIS — Z79899 Other long term (current) drug therapy: Secondary | ICD-10-CM | POA: Insufficient documentation

## 2014-04-12 DIAGNOSIS — I2510 Atherosclerotic heart disease of native coronary artery without angina pectoris: Secondary | ICD-10-CM | POA: Diagnosis present

## 2014-04-12 DIAGNOSIS — I739 Peripheral vascular disease, unspecified: Secondary | ICD-10-CM | POA: Diagnosis present

## 2014-04-12 DIAGNOSIS — I10 Essential (primary) hypertension: Secondary | ICD-10-CM | POA: Diagnosis not present

## 2014-04-12 DIAGNOSIS — Z86711 Personal history of pulmonary embolism: Secondary | ICD-10-CM | POA: Insufficient documentation

## 2014-04-12 DIAGNOSIS — D45 Polycythemia vera: Secondary | ICD-10-CM

## 2014-04-12 DIAGNOSIS — Z7901 Long term (current) use of anticoagulants: Secondary | ICD-10-CM

## 2014-04-12 DIAGNOSIS — G629 Polyneuropathy, unspecified: Secondary | ICD-10-CM

## 2014-04-12 DIAGNOSIS — E871 Hypo-osmolality and hyponatremia: Secondary | ICD-10-CM | POA: Diagnosis not present

## 2014-04-12 DIAGNOSIS — I359 Nonrheumatic aortic valve disorder, unspecified: Secondary | ICD-10-CM | POA: Diagnosis not present

## 2014-04-12 DIAGNOSIS — I679 Cerebrovascular disease, unspecified: Secondary | ICD-10-CM | POA: Diagnosis not present

## 2014-04-12 DIAGNOSIS — E66812 Obesity, class 2: Secondary | ICD-10-CM | POA: Diagnosis present

## 2014-04-12 DIAGNOSIS — I498 Other specified cardiac arrhythmias: Secondary | ICD-10-CM | POA: Insufficient documentation

## 2014-04-12 DIAGNOSIS — G609 Hereditary and idiopathic neuropathy, unspecified: Secondary | ICD-10-CM

## 2014-04-12 DIAGNOSIS — Z6836 Body mass index (BMI) 36.0-36.9, adult: Secondary | ICD-10-CM | POA: Diagnosis not present

## 2014-04-12 DIAGNOSIS — J4489 Other specified chronic obstructive pulmonary disease: Secondary | ICD-10-CM | POA: Diagnosis present

## 2014-04-12 DIAGNOSIS — D751 Secondary polycythemia: Secondary | ICD-10-CM

## 2014-04-12 DIAGNOSIS — I350 Nonrheumatic aortic (valve) stenosis: Secondary | ICD-10-CM | POA: Diagnosis present

## 2014-04-12 DIAGNOSIS — E785 Hyperlipidemia, unspecified: Secondary | ICD-10-CM | POA: Diagnosis not present

## 2014-04-12 DIAGNOSIS — Z7982 Long term (current) use of aspirin: Secondary | ICD-10-CM | POA: Diagnosis not present

## 2014-04-12 DIAGNOSIS — Z5181 Encounter for therapeutic drug level monitoring: Secondary | ICD-10-CM

## 2014-04-12 DIAGNOSIS — I4510 Unspecified right bundle-branch block: Secondary | ICD-10-CM | POA: Diagnosis not present

## 2014-04-12 DIAGNOSIS — R42 Dizziness and giddiness: Secondary | ICD-10-CM | POA: Diagnosis present

## 2014-04-12 DIAGNOSIS — R9430 Abnormal result of cardiovascular function study, unspecified: Secondary | ICD-10-CM

## 2014-04-12 DIAGNOSIS — I2699 Other pulmonary embolism without acute cor pulmonale: Secondary | ICD-10-CM | POA: Diagnosis present

## 2014-04-12 DIAGNOSIS — Z0000 Encounter for general adult medical examination without abnormal findings: Secondary | ICD-10-CM

## 2014-04-12 DIAGNOSIS — E669 Obesity, unspecified: Secondary | ICD-10-CM | POA: Diagnosis not present

## 2014-04-12 DIAGNOSIS — IMO0002 Reserved for concepts with insufficient information to code with codable children: Secondary | ICD-10-CM | POA: Insufficient documentation

## 2014-04-12 LAB — CBC WITH DIFFERENTIAL/PLATELET
BASOS PCT: 0 % (ref 0–1)
Basophils Absolute: 0 10*3/uL (ref 0.0–0.1)
Basophils Absolute: 0 10*3/uL (ref 0.0–0.1)
Basophils Relative: 0 % (ref 0–1)
EOS ABS: 0 10*3/uL (ref 0.0–0.7)
Eosinophils Absolute: 0 10*3/uL (ref 0.0–0.7)
Eosinophils Relative: 0 % (ref 0–5)
Eosinophils Relative: 0 % (ref 0–5)
HCT: 48.8 % (ref 39.0–52.0)
HCT: 49.4 % (ref 39.0–52.0)
Hemoglobin: 17.4 g/dL — ABNORMAL HIGH (ref 13.0–17.0)
Hemoglobin: 17.4 g/dL — ABNORMAL HIGH (ref 13.0–17.0)
LYMPHS ABS: 0.9 10*3/uL (ref 0.7–4.0)
Lymphocytes Relative: 5 % — ABNORMAL LOW (ref 12–46)
Lymphocytes Relative: 13 % (ref 12–46)
Lymphs Abs: 0.5 10*3/uL — ABNORMAL LOW (ref 0.7–4.0)
MCH: 32.9 pg (ref 26.0–34.0)
MCH: 33.1 pg (ref 26.0–34.0)
MCHC: 35.2 g/dL (ref 30.0–36.0)
MCHC: 35.7 g/dL (ref 30.0–36.0)
MCV: 92.2 fL (ref 78.0–100.0)
MCV: 93.9 fL (ref 78.0–100.0)
Monocytes Absolute: 0.7 10*3/uL (ref 0.1–1.0)
Monocytes Absolute: 0.7 10*3/uL (ref 0.1–1.0)
Monocytes Relative: 8 % (ref 3–12)
Monocytes Relative: 11 % (ref 3–12)
NEUTROS PCT: 76 % (ref 43–77)
NEUTROS PCT: 87 % — AB (ref 43–77)
Neutro Abs: 7.5 10*3/uL (ref 1.7–7.7)
Neutro Abs: 5 10*3/uL (ref 1.7–7.7)
PLATELETS: 158 10*3/uL (ref 150–400)
PLATELETS: 188 10*3/uL (ref 150–400)
RBC: 5.29 MIL/uL (ref 4.22–5.81)
RBC: 5.26 MIL/uL (ref 4.22–5.81)
RDW: 13 % (ref 11.5–15.5)
RDW: 13 % (ref 11.5–15.5)
WBC: 8.7 10*3/uL (ref 4.0–10.5)
WBC: 6.6 10*3/uL (ref 4.0–10.5)

## 2014-04-12 LAB — COMPREHENSIVE METABOLIC PANEL
ALBUMIN: 3.2 g/dL — AB (ref 3.5–5.2)
ALK PHOS: 55 U/L (ref 39–117)
ALT: 38 U/L (ref 0–53)
AST: 34 U/L (ref 0–37)
Anion gap: 14 (ref 5–15)
BUN: 13 mg/dL (ref 6–23)
CO2: 21 mEq/L (ref 19–32)
Calcium: 8.8 mg/dL (ref 8.4–10.5)
Chloride: 101 mEq/L (ref 96–112)
Creatinine, Ser: 0.8 mg/dL (ref 0.50–1.35)
GFR calc Af Amer: >90 mL/min (ref >90)
GFR calc non Af Amer: >90 mL/min (ref >90)
Glucose, Bld: 110 mg/dL — ABNORMAL HIGH (ref 70–99)
POTASSIUM: 3.9 meq/L (ref 3.7–5.3)
SODIUM: 136 meq/L — AB (ref 137–147)
TOTAL PROTEIN: 7 g/dL (ref 6.0–8.3)
Total Bilirubin: 0.8 mg/dL (ref 0.3–1.2)

## 2014-04-12 LAB — URINALYSIS, ROUTINE W REFLEX MICROSCOPIC
Glucose, UA: NEGATIVE mg/dL
Hgb urine dipstick: NEGATIVE
Ketones, ur: 15 mg/dL — AB
Leukocytes, UA: NEGATIVE
NITRITE: NEGATIVE
Protein, ur: NEGATIVE mg/dL
SPECIFIC GRAVITY, URINE: 1.028 (ref 1.005–1.030)
Urobilinogen, UA: 1 mg/dL (ref 0.0–1.0)
pH: 5 (ref 5.0–8.0)

## 2014-04-12 LAB — RAPID URINE DRUG SCREEN, HOSP PERFORMED
Amphetamines: NOT DETECTED
BARBITURATES: NOT DETECTED
Benzodiazepines: NOT DETECTED
Cocaine: NOT DETECTED
Opiates: NOT DETECTED
Tetrahydrocannabinol: NOT DETECTED

## 2014-04-12 LAB — TROPONIN I: Troponin I: <0.3 ng/mL (ref <0.30)

## 2014-04-12 LAB — HEMOGLOBIN A1C
Hgb A1c MFr Bld: 5.8 % — ABNORMAL HIGH (ref <5.7)
MEAN PLASMA GLUCOSE: 120 mg/dL — AB (ref <117)

## 2014-04-12 LAB — LIPID PANEL
CHOL/HDL RATIO: 6.3 ratio
CHOLESTEROL: 163 mg/dL (ref 0–200)
HDL: 26 mg/dL — ABNORMAL LOW (ref >39)
LDL Cholesterol: 104 mg/dL — ABNORMAL HIGH (ref 0–99)
TRIGLYCERIDES: 163 mg/dL — AB (ref <150)
VLDL: 33 mg/dL (ref 0–40)

## 2014-04-12 LAB — BASIC METABOLIC PANEL
ANION GAP: 14 (ref 5–15)
BUN: 14 mg/dL (ref 6–23)
CO2: 22 mEq/L (ref 19–32)
Calcium: 9.4 mg/dL (ref 8.4–10.5)
Chloride: 98 mEq/L (ref 96–112)
Creatinine, Ser: 0.91 mg/dL (ref 0.50–1.35)
GFR, EST NON AFRICAN AMERICAN: 88 mL/min — AB (ref >90)
Glucose, Bld: 116 mg/dL — ABNORMAL HIGH (ref 70–99)
POTASSIUM: 4.2 meq/L (ref 3.7–5.3)
SODIUM: 134 meq/L — AB (ref 137–147)

## 2014-04-12 LAB — GLUCOSE, CAPILLARY
GLUCOSE-CAPILLARY: 90 mg/dL (ref 70–99)
GLUCOSE-CAPILLARY: 116 mg/dL — AB (ref 70–99)

## 2014-04-12 LAB — PROTIME-INR
INR: 2.39 — ABNORMAL HIGH (ref 0.00–1.49)
PROTHROMBIN TIME: 26.1 s — AB (ref 11.6–15.2)

## 2014-04-12 LAB — I-STAT CG4 LACTIC ACID, ED: Lactic Acid, Venous: 1.05 mmol/L (ref 0.5–2.2)

## 2014-04-12 MED ORDER — ISOSORBIDE MONONITRATE ER 30 MG PO TB24
30.0000 mg | ORAL_TABLET | Freq: Every day | ORAL | Status: DC
  Administered 2014-04-12: 30 mg via ORAL
  Filled 2014-04-12: qty 1

## 2014-04-12 MED ORDER — WARFARIN SODIUM 5 MG PO TABS
5.0000 mg | ORAL_TABLET | ORAL | Status: DC
  Administered 2014-04-12: 5 mg via ORAL
  Filled 2014-04-12: qty 1

## 2014-04-12 MED ORDER — ACETAMINOPHEN 650 MG RE SUPP: 650.0000 mg | RECTAL | Status: DC | PRN

## 2014-04-12 MED ORDER — ATORVASTATIN CALCIUM 80 MG PO TABS
80.0000 mg | ORAL_TABLET | Freq: Every day | ORAL | Status: DC
  Administered 2014-04-12 – 2014-04-13 (×2): 80 mg via ORAL
  Filled 2014-04-12 (×2): qty 1

## 2014-04-12 MED ORDER — SODIUM CHLORIDE 0.9 % IV SOLN
INTRAVENOUS | Status: DC
Start: 2014-04-12 — End: 2014-04-13
  Administered 2014-04-12: 18:00:00 via INTRAVENOUS

## 2014-04-12 MED ORDER — LOSARTAN POTASSIUM 50 MG PO TABS
50.0000 mg | ORAL_TABLET | Freq: Every day | ORAL | Status: DC
  Administered 2014-04-12 – 2014-04-13 (×2): 50 mg via ORAL
  Filled 2014-04-12 (×2): qty 1

## 2014-04-12 MED ORDER — WARFARIN - PHARMACIST DOSING INPATIENT
Freq: Every day | Status: DC
  Administered 2014-04-12: 18:00:00

## 2014-04-12 MED ORDER — SODIUM CHLORIDE 0.9 % IV BOLUS (SEPSIS)
1000.0000 mL | Freq: Once | INTRAVENOUS | Status: AC
Start: 2014-04-12 — End: 2014-04-12
  Administered 2014-04-12: 1000 mL via INTRAVENOUS

## 2014-04-12 MED ORDER — ACETAMINOPHEN 325 MG PO TABS
650.0000 mg | ORAL_TABLET | Freq: Four times a day (QID) | ORAL | Status: DC | PRN
  Administered 2014-04-12 (×2): 650 mg via ORAL
  Filled 2014-04-12 (×2): qty 2

## 2014-04-12 MED ORDER — HYDROCODONE-ACETAMINOPHEN 5-325 MG PO TABS: 1.0000 | ORAL_TABLET | Freq: Four times a day (QID) | ORAL | Status: DC | PRN

## 2014-04-12 MED ORDER — WARFARIN SODIUM 2.5 MG PO TABS
2.5000 mg | ORAL_TABLET | ORAL | Status: DC
  Filled 2014-04-12: qty 1

## 2014-04-12 MED ORDER — ASPIRIN EC 81 MG PO TBEC
81.0000 mg | DELAYED_RELEASE_TABLET | Freq: Every day | ORAL | Status: DC
  Administered 2014-04-12 – 2014-04-13 (×2): 81 mg via ORAL
  Filled 2014-04-12 (×2): qty 1

## 2014-04-12 MED ORDER — STROKE: EARLY STAGES OF RECOVERY BOOK
Freq: Once | Status: DC
  Filled 2014-04-12: qty 1

## 2014-04-12 MED ORDER — SODIUM CHLORIDE 0.9 % IV BOLUS (SEPSIS)
1000.0000 mL | Freq: Once | INTRAVENOUS | Status: AC
  Administered 2014-04-12: 1000 mL via INTRAVENOUS

## 2014-04-12 MED ORDER — WARFARIN SODIUM 2.5 MG PO TABS: 2.5000 mg | ORAL_TABLET | ORAL | Status: DC

## 2014-04-12 NOTE — Progress Notes (Signed)
ANTICOAGULATION CONSULT NOTE - Initial Consult  Pharmacy Consult for Warfarin  Indication: atrial fibrillation  No Known Allergies  Patient Measurements: Height: 5\' 11"  (180.3 cm) Weight: 264 lb 15.9 oz (120.2 kg) IBW/kg (Calculated) : 75.3  Vital Signs: Temp: 98.7 F (37.1 C) (08/11 0308) Temp src: Oral (08/11 0308) BP: 131/83 mmHg (08/11 0308) Pulse Rate: 87 (08/11 0308)  Labs:  Recent Labs  04/12/14 0049  HGB 17.4*  HCT 48.8  PLT 188  LABPROT 26.1*  INR 2.39*  CREATININE 0.91  TROPONINI <0.30    Medical History: Past Medical History  Diagnosis Date  . Hypertension   . Hernia   . COPD (chronic obstructive pulmonary disease)   . History of blood clots 2007    in lungs  . CAD (coronary artery disease)     Catheterization, [place], July, 2006, EF 45%,  50% LAD, 50% RCA, 90% marginal, possible collaterals from LAD  to a branch of the PDA, medical therapy recommended  . GI bleed     2006, no significant abnormalities per the patient  . PAD (peripheral artery disease)     Arterial leg Dopplers, April, 2014, normal  . Ejection fraction     EF 45%, catheterization, 2006  //   EF 55%, echo, March, 2012, wall abnormality at the base of the inferior wall  . Aortic stenosis     severe echo 01/2013- cannot rule out bicuspid aortic valve  //   moderately severe, echo,  March, 2012 ( progression since 2009)  . RBBB   . Pulmonary emboli     Significant, 2007, Coumadin therapy  . Warfarin anticoagulation     History pulmonary emboli  . Bradycardia   . Tobacco abuse   . Carotid artery disease     Doppler, March, 2012,  0-39% bilateral  . Dyslipidemia     Medications:  Warfarin PTA Dose: 5 mg Tues/Fri, 2.5 mg all other days, from outpatient anti-coagulation notes  Assessment: Warfarin PTA for afib. INR therapeutic on admit at 2.39, CBC good, renal function good, other labs as above.   Goal of Therapy:  INR 2-3 Monitor platelets by anticoagulation protocol:  Yes   Plan:  -Warfarin per home dose -Daily PT/INR, adjust as needed -Monitor for bleeding  Narda Bonds 04/12/2014,4:09 AM

## 2014-04-12 NOTE — H&P (Signed)
[facility] History and Physical  Patient: Chris Reyes  WHQ:[id number]  DOB: 09-23-1950  DOS: the patient was seen and examined on 04/12/2014 PCP: Gwendolyn Grant, MD  Chief Complaint: Dizziness  HPI: Chris Reyes is a 63 y.o. male with Past medical history of aortic stenosis, coronary artery disease, peripheral vascular disease, COPD, pulmonary embolism, morbid obesity, essential hypertension. The patient presented with complaints of dizziness. He mentions that she has chronic dizziness on and off prednisone since last 2-3 weeks. But since last one day background of her on his lunch he started having complaints of dizziness which is worse than his usual. He denies it being worked equal. But he mentions this symptoms occur whenever he moves his head. Also his symptoms occur when he is changing his position. His symptoms do not occur when he is lying down and moves his head. He denies any chest pain or palpitation shortness of breath, nausea, vomiting, abdominal pain, diarrhea, decreased oral intake, changes in his medications, focal deficit. He also denies any was seen out episodes or falls. He has been trying to lose weight on his own as he has been recommended for aortic wall surgery.  The patient is coming from home And at his baseline independent for most of his ADL.  Review of Systems: as mentioned in the history of present illness.  A Comprehensive review of the other systems is negative.  Past Medical History  Diagnosis Date  . Hypertension   . Hernia   . COPD (chronic obstructive pulmonary disease)   . History of blood clots 2007    in lungs  . CAD (coronary artery disease)     Catheterization, [place], July, 2006, EF 45%,  50% LAD, 50% RCA, 90% marginal, possible collaterals from LAD  to a branch of the PDA, medical therapy recommended  . GI bleed     2006, no significant abnormalities per the patient  . PAD (peripheral artery disease)     Arterial leg  Dopplers, April, 2014, normal  . Ejection fraction     EF 45%, catheterization, 2006  //   EF 55%, echo, March, 2012, wall abnormality at the base of the inferior wall  . Aortic stenosis     severe echo 01/2013- cannot rule out bicuspid aortic valve  //   moderately severe, echo,  March, 2012 ( progression since 2009)  . RBBB   . Pulmonary emboli     Significant, 2007, Coumadin therapy  . Warfarin anticoagulation     History pulmonary emboli  . Bradycardia   . Tobacco abuse   . Carotid artery disease     Doppler, March, 2012,  0-39% bilateral  . Dyslipidemia    Past Surgical History  Procedure Laterality Date  . Back surgery  1988, 1990, 2006    2 surgeries. disc fusion, pins installed. (L-5-S1)  . Cholecystectomy  1998  . Nerve rotation      left arm  . Tonsillectomy    . Gallbladder surgery     Social History:  reports that he has been smoking Cigarettes.  He has been smoking about 0.25 packs per day. He does not have any smokeless tobacco history on file. He reports that he drinks alcohol. He reports that he does not use illicit drugs.  No Known Allergies  Family History  Problem Relation Age of Onset  . Diabetes Father   . Cancer Father     large cell lung cancer  . Heart disease Mother  Prior to Admission medications   Medication Sig Start Date End Date Taking? Authorizing Provider  acetaminophen (TYLENOL) 500 MG tablet Take 1,500 mg by mouth every 6 (six) hours as needed (pain).   Yes Historical Provider, MD  atorvastatin (LIPITOR) 80 MG tablet Take 1 tablet (80 mg total) by mouth daily. 02/02/13  Yes Neena Rhymes, MD  Coenzyme Q10 (COQ10) 100 MG CAPS Take 1 capsule by mouth.   Yes Historical Provider, MD  fish oil-omega-3 fatty acids 1000 MG capsule Take 1 g by mouth daily.   Yes Historical Provider, MD  fluticasone (FLONASE) 50 MCG/ACT nasal spray Place 1 spray into both nostrils daily. 08/30/13  Yes Rowe Clack, MD  ibuprofen (ADVIL,MOTRIN) 200 MG  tablet Take 600 mg by mouth every 6 (six) hours as needed (pain).   Yes Historical Provider, MD  isosorbide mononitrate (IMDUR) 30 MG 24 hr tablet Take 30 mg by mouth daily.   Yes Historical Provider, MD  losartan (COZAAR) 50 MG tablet Take 1 tablet (50 mg total) by mouth daily. 02/01/14  Yes Rowe Clack, MD  warfarin (COUMADIN) 5 MG tablet Take 2.5-5 mg by mouth daily. Takes 5mg  on Tues and Thursdays and half a tab 2.5 mg Mon. Wed, Fri, Sat, Sun   Yes Historical Provider, MD    Physical Exam: Filed Vitals:   04/12/14 0200 04/12/14 0245 04/12/14 0308 04/12/14 0400  BP: 111/69 124/78 131/83   Pulse: 98 98 87   Temp:   98.7 F (37.1 C) 98.5 F (36.9 C)  TempSrc:   Oral   Resp: 28 21 22    Height:   5\' 11"  (1.803 m)   Weight:   120.2 kg (264 lb 15.9 oz)   SpO2: 95% 98% 96%     General: Alert, Awake and Oriented to Time, Place and Person. Appear in mild distress Eyes: PERRL ENT: Oral Mucosa clear moist. Neck: no JVD Cardiovascular: S1 and S2 Present, no Murmur, Peripheral Pulses Present Respiratory: Bilateral Air entry equal and Decreased, Clear to Auscultation, no Crackles, no wheezes Abdomen: Bowel Sound Present, Soft and Non tender Skin: No Rash Extremities: Bilateral trace Pedal edema, no calf tenderness Neurologic: Grossly no focal neuro deficit, minor difficulty with finger-nose-finger bilaterally, no nystagmus, reflexes present, equal strength and equal sensations.  Labs on Admission:  CBC:  Recent Labs Lab 04/12/14 0049  WBC 8.7  NEUTROABS 7.5  HGB 17.4*  HCT 48.8  MCV 92.2  PLT 188    CMP     Component Value Date/Time   NA 134* 04/12/2014 0049   K 4.2 04/12/2014 0049   CL 98 04/12/2014 0049   CO2 22 04/12/2014 0049   GLUCOSE 116* 04/12/2014 0049   BUN 14 04/12/2014 0049   CREATININE 0.91 04/12/2014 0049   CALCIUM 9.4 04/12/2014 0049   PROT 8.1 12/21/2013 1243   ALBUMIN 3.7 12/21/2013 1243   AST 26 12/21/2013 1243   ALT 32 12/21/2013 1243   ALKPHOS 58  12/21/2013 1243   BILITOT 0.9 12/21/2013 1243   GFRNONAA 88* 04/12/2014 0049   GFRAA >90 04/12/2014 0049    No results found for this basename: LIPASE, AMYLASE,  in the last 168 hours No results found for this basename: AMMONIA,  in the last 168 hours   Recent Labs Lab 04/12/14 0049  TROPONINI <0.30   BNP (last 3 results) No results found for this basename: PROBNP,  in the last 8760 hours  Radiological Exams on Admission: Ct Head Wo Contrast  04/12/2014  CLINICAL DATA:  Dizziness.  EXAM: CT HEAD WITHOUT CONTRAST  TECHNIQUE: Contiguous axial images were obtained from the base of the skull through the vertex without intravenous contrast.  COMPARISON:  None.  FINDINGS: There is no evidence of acute infarction, mass lesion, or intra- or extra-axial hemorrhage on CT.  Prominence of the ventricles and sulci reflects mild cortical volume loss. Mild periventricular white matter change likely reflects small vessel ischemic microangiopathy.  The brainstem and fourth ventricle are within normal limits. The basal ganglia are unremarkable in appearance. The cerebral hemispheres demonstrate grossly normal gray-white differentiation. No mass effect or midline shift is seen.  There is no evidence of fracture; visualized osseous structures are unremarkable in appearance. The orbits are within normal limits. A mucus retention cyst or polyp is noted within the right maxillary sinus. The remaining paranasal sinuses and mastoid air cells are well-aerated. No significant soft tissue abnormalities are seen.  IMPRESSION: 1. No acute intracranial pathology seen on CT. 2. Mild cortical volume loss and scattered small vessel ischemic microangiopathy. 3. Mucus retention cyst or polyp within the right maxillary sinus.   Electronically Signed   By: Garald Balding M.D.   On: 04/12/2014 01:33   Dg Chest Port 1 View  04/12/2014   CLINICAL DATA:  Dry cough, chills and dizziness. History of smoking.  EXAM: PORTABLE CHEST - 1 VIEW   COMPARISON:  Chest radiograph performed 08/30/2013  FINDINGS: The lungs are well-aerated. Mild bibasilar opacities appear grossly stable and likely reflect chronic lung changes. No definite superimposed focal airspace consolidation is seen. No pleural effusion or pneumothorax is identified, though the costophrenic angles are incompletely imaged on this study.  The cardiomediastinal silhouette is within normal limits. No acute osseous abnormalities are seen.  IMPRESSION: Mild bibasilar airspace opacities appear grossly stable and likely reflect chronic lung changes. No superimposed focal airspace consolidation seen.   Electronically Signed   By: Garald Balding M.D.   On: 04/12/2014 01:17    Assessment/Plan Principal Problem:   Dizziness Active Problems:   HYPERTENSION   COPD   CAD (coronary artery disease)   Aortic stenosis   Pulmonary emboli   RBBB   PAD (peripheral artery disease)   Obesity, Class II, BMI 35-39.9   Long term (current) use of anticoagulants   1. Dizziness The patient presents with complaints of dizziness and lightheadedness. He is CT of the head is negative for any acute abnormality. Examination does not have any further evidence other than subjective dizziness. He does not appear to have any significant orthostasis neither he has any significant EKG changes but ED physician. With this the patient is currently admitted in the hospital. [facility] monitor him on telemetry. Serial neuro checks and PTOT consultation. Possible etiology include libido this is due to viral infection, posterior circulation stroke, aortic stenosis becoming symptomatic At present I would open MRI of his brain to rule out any stroke. If the MRI is negative then he may require further workup related to cardiology consultation for discussion of his aortic stenosis management.  2. Pulmonary embolism. Continue him on warfarin  3. COPD Continue current management  4. Coronary artery  disease Cerebrovascular disease Continue current management at present.  Consults: PTOT  DVT Prophylaxis on chronic anticoagulation Nutrition: Stroke evaluation cardiac diet  Code Status: Full  Disposition: Admitted to observation in telemetry unit.  Author: Berle Mull, MD [facility] Pager: [phone] 04/12/2014, 5:52 AM    If 7PM-7AM, please contact night-coverage [email or web address] Password [email or web address]  **Disclaimer: This note  may have been dictated with voice recognition software. Similar sounding words can inadvertently be transcribed and this note may contain transcription errors which may not have been corrected upon publication of note.**

## 2014-04-12 NOTE — Progress Notes (Signed)
UR Completed.  Khristine Verno Jane [phone] 04/12/2014  

## 2014-04-12 NOTE — ED Provider Notes (Signed)
CSN: [id number]     Arrival date & time 04/12/14  0017 History   None    Chief Complaint  Patient presents with  . Dizziness     (Consider location/radiation/quality/duration/timing/severity/associated sxs/prior Treatment) Patient is a 63 y.o. male presenting with dizziness. The history is provided by the patient.  Dizziness He has noticed dizziness when he sits up or stands up. This has been going on for the last several days but got much worse today. Today, he is unable to even sit up without getting very dizzy. There is some tightness in his chest but no chest pain or heaviness. There is no dyspnea or nausea but he has been diaphoretic. He says that he is unable even to get up and walk a short distance to the bathroom. On occasion, he did fall but states he did not hit his head. He is on warfarin for a history of pulmonary embolism but states his stools have been normal color. He denies fever but did have chills earlier today. He has had a dry cough and has noted some urinary urgency and frequency without dysuria. He does have severe aortic stenosis and states he is trying to lose weight so that he can have the surgery.  Past Medical History  Diagnosis Date  . Hypertension   . Hernia   . COPD (chronic obstructive pulmonary disease)   . History of blood clots 2007    in lungs  . CAD (coronary artery disease)     Catheterization, [place], July, 2006, EF 45%,  50% LAD, 50% RCA, 90% marginal, possible collaterals from LAD  to a branch of the PDA, medical therapy recommended  . GI bleed     2006, no significant abnormalities per the patient  . PAD (peripheral artery disease)     Arterial leg Dopplers, April, 2014, normal  . Ejection fraction     EF 45%, catheterization, 2006  //   EF 55%, echo, March, 2012, wall abnormality at the base of the inferior wall  . Aortic stenosis     severe echo 01/2013- cannot rule out bicuspid aortic valve  //   moderately severe, echo,  March, 2012 (  progression since 2009)  . RBBB   . Pulmonary emboli     Significant, 2007, Coumadin therapy  . Warfarin anticoagulation     History pulmonary emboli  . Bradycardia   . Tobacco abuse   . Carotid artery disease     Doppler, March, 2012,  0-39% bilateral  . Dyslipidemia    Past Surgical History  Procedure Laterality Date  . Back surgery  1988, 1990, 2006    2 surgeries. disc fusion, pins installed. (L-5-S1)  . Cholecystectomy  1998  . Nerve rotation      left arm  . Tonsillectomy    . Gallbladder surgery     Family History  Problem Relation Age of Onset  . Diabetes Father   . Cancer Father     large cell lung cancer  . Heart disease Mother    History  Substance Use Topics  . Smoking status: Current Every Day Smoker -- 0.25 packs/day    Types: Cigarettes  . Smokeless tobacco: Not on file     Comment: 5-10 cigarettes per day  . Alcohol Use: Yes     Comment: 1 drink a month    Review of Systems  Neurological: Positive for dizziness.  All other systems reviewed and are negative.     Allergies  Review of patient's  allergies indicates no known allergies.  Home Medications   Prior to Admission medications   Medication Sig Start Date End Date Taking? Authorizing Provider  atorvastatin (LIPITOR) 80 MG tablet Take 1 tablet (80 mg total) by mouth daily. 02/02/13   Neena Rhymes, MD  Coenzyme Q10 (COQ10) 100 MG CAPS Take 1 capsule by mouth.    Historical Provider, MD  fish oil-omega-3 fatty acids 1000 MG capsule Take 1 g by mouth daily.    Historical Provider, MD  fluticasone (FLONASE) 50 MCG/ACT nasal spray Place 1 spray into both nostrils daily. 08/30/13   Rowe Clack, MD  HYDROcodone-acetaminophen (NORCO/VICODIN) 5-325 MG per tablet Take 1 tablet by mouth as needed. 02/02/13   Neena Rhymes, MD  isosorbide mononitrate (IMDUR) 30 MG 24 hr tablet TAKE 1 TABLET EVERY DAY 04/06/14   Rowe Clack, MD  losartan (COZAAR) 50 MG tablet Take 1 tablet (50 mg total)  by mouth daily. 02/01/14   Rowe Clack, MD  warfarin (COUMADIN) 5 MG tablet Take as directed by anticoagulation clinic 03/08/14   Rowe Clack, MD   BP 130/70  Temp(Src) 99.2 F (37.3 C) (Oral)  Resp 27  SpO2 97% Physical Exam  Nursing note and vitals reviewed.  63 year old male, resting comfortably and in no acute distress. Vital signs are significant for tachycardia with heart rate 112, and tachypnea with respiratory rate of 27. Oxygen saturation is 97%, which is normal. Head is normocephalic and atraumatic. PERRLA, EOMI. Oropharynx is clear. Conjunctivae are pink. Neck is nontender and supple without adenopathy or JVD. Back is nontender and there is no CVA tenderness. Lungs are clear without rales, wheezes, or rhonchi. Chest is nontender. Heart has regular rate and rhythm with 3/6 systolic ejection murmur heard throughout the precordium. Abdomen is soft, flat, nontender without masses or hepatosplenomegaly and peristalsis is normoactive. Extremities have no cyanosis or edema, full range of motion is present. Skin is warm and mildly diaphoretic without rash. Neurologic: Mental status is normal, cranial nerves are intact, there are no motor or sensory deficits.  ED Course  Procedures (including critical care time) Labs Review Results for orders placed during the hospital encounter of 04/12/14  CBC WITH DIFFERENTIAL      Result Value Ref Range   WBC 8.7  4.0 - 10.5 K/uL   RBC 5.29  4.22 - 5.81 MIL/uL   Hemoglobin 17.4 (*) 13.0 - 17.0 g/dL   HCT 48.8  39.0 - 52.0 %   MCV 92.2  78.0 - 100.0 fL   MCH 32.9  26.0 - 34.0 pg   MCHC 35.7  30.0 - 36.0 g/dL   RDW 13.0  11.5 - 15.5 %   Platelets 188  150 - 400 K/uL   Neutrophils Relative % 87 (*) 43 - 77 %   Neutro Abs 7.5  1.7 - 7.7 K/uL   Lymphocytes Relative 5 (*) 12 - 46 %   Lymphs Abs 0.5 (*) 0.7 - 4.0 K/uL   Monocytes Relative 8  3 - 12 %   Monocytes Absolute 0.7  0.1 - 1.0 K/uL   Eosinophils Relative 0  0 - 5 %    Eosinophils Absolute 0.0  0.0 - 0.7 K/uL   Basophils Relative 0  0 - 1 %   Basophils Absolute 0.0  0.0 - 0.1 K/uL  BASIC METABOLIC PANEL      Result Value Ref Range   Sodium 134 (*) 137 - 147 mEq/L   Potassium 4.2  3.7 - 5.3 mEq/L   Chloride 98  96 - 112 mEq/L   CO2 22  19 - 32 mEq/L   Glucose, Bld 116 (*) 70 - 99 mg/dL   BUN 14  6 - 23 mg/dL   Creatinine, Ser 0.91  0.50 - 1.35 mg/dL   Calcium 9.4  8.4 - 10.5 mg/dL   GFR calc non Af Amer 88 (*) >90 mL/min   GFR calc Af Amer >90  >90 mL/min   Anion gap 14  5 - 15  TROPONIN I      Result Value Ref Range   Troponin I <0.30  <0.30 ng/mL  PROTIME-INR      Result Value Ref Range   Prothrombin Time 26.1 (*) 11.6 - 15.2 seconds   INR 2.39 (*) 0.00 - 1.49  I-STAT CG4 LACTIC ACID, ED      Result Value Ref Range   Lactic Acid, Venous 1.05  0.5 - 2.2 mmol/L   Imaging Review Ct Head Wo Contrast  04/12/2014   CLINICAL DATA:  Dizziness.  EXAM: CT HEAD WITHOUT CONTRAST  TECHNIQUE: Contiguous axial images were obtained from the base of the skull through the vertex without intravenous contrast.  COMPARISON:  None.  FINDINGS: There is no evidence of acute infarction, mass lesion, or intra- or extra-axial hemorrhage on CT.  Prominence of the ventricles and sulci reflects mild cortical volume loss. Mild periventricular white matter change likely reflects small vessel ischemic microangiopathy.  The brainstem and fourth ventricle are within normal limits. The basal ganglia are unremarkable in appearance. The cerebral hemispheres demonstrate grossly normal gray-white differentiation. No mass effect or midline shift is seen.  There is no evidence of fracture; visualized osseous structures are unremarkable in appearance. The orbits are within normal limits. A mucus retention cyst or polyp is noted within the right maxillary sinus. The remaining paranasal sinuses and mastoid air cells are well-aerated. No significant soft tissue abnormalities are seen.   IMPRESSION: 1. No acute intracranial pathology seen on CT. 2. Mild cortical volume loss and scattered small vessel ischemic microangiopathy. 3. Mucus retention cyst or polyp within the right maxillary sinus.   Electronically Signed   By: Garald Balding M.D.   On: 04/12/2014 01:33   Dg Chest Port 1 View  04/12/2014   CLINICAL DATA:  Dry cough, chills and dizziness. History of smoking.  EXAM: PORTABLE CHEST - 1 VIEW  COMPARISON:  Chest radiograph performed 08/30/2013  FINDINGS: The lungs are well-aerated. Mild bibasilar opacities appear grossly stable and likely reflect chronic lung changes. No definite superimposed focal airspace consolidation is seen. No pleural effusion or pneumothorax is identified, though the costophrenic angles are incompletely imaged on this study.  The cardiomediastinal silhouette is within normal limits. No acute osseous abnormalities are seen.  IMPRESSION: Mild bibasilar airspace opacities appear grossly stable and likely reflect chronic lung changes. No superimposed focal airspace consolidation seen.   Electronically Signed   By: Garald Balding M.D.   On: 04/12/2014 01:17     Date: 04/12/2014  Rate: 112  Rhythm: sinus tachycardia  QRS Axis: normal  Intervals: normal  ST/T Wave abnormalities: normal  Conduction Disutrbances:right bundle branch block  Narrative Interpretation: Sinus tachycardia, right bundle branch block. Compared with the CT of 01/05/2014, heart rate has increased.  Old EKG Reviewed: unchanged   MDM   Final diagnoses:  Orthostatic dizziness  Aortic stenosis  Anticoagulated on warfarin  Polycythemia    Orthostatic dizziness of uncertain cause. Although he is afebrile, and  chest x-ray and urinalysis are obtained to look for occult infection. Troponin level was checked to look for cardiac etiology and lactic acid level was checked. He is on warfarin, so hemoglobin level needs to be checked. Because of fall inpatient on warfarin, head CT will need to be  obtained. Old records are reviewed and he is followed for severe aortic stenosis and apparently has had surgery recommended several times in the past and he has been putting it off.   Work up is unremarkable. He has a therapeutic INR. Hemoglobin is slightly decreased compare with baseline but he is still polycythemic. Orthostatic vital signs show a drop in blood pressure and rise in pulse but not to diagnostic levels. However, he was very unsteady on sitting and standing. I'm concerned about possibility of posterior circulation stroke. Symptoms are not suggestive of symptomatic aortic stenosis. Case is discussed with Dr. Posey Pronto outside hospitals he agrees to admit the patient under observation status.  Delora Fuel, MD 16/38/46 [id number]

## 2014-04-12 NOTE — Care Management Note (Signed)
    Page 1 of 1   04/13/2014     4:47:15 PM CARE MANAGEMENT NOTE 04/13/2014  Patient:  Chris Reyes, Chris Reyes   Account Number:  [id number]  Date Initiated:  04/12/2014  Documentation initiated by:  Cathleen Yagi  Subjective/Objective Assessment:   Pt adm on 04/12/14 with dizziness.  PTA, pt independent, lives with spouse.     Action/Plan:   Will follow for dc needs as pt progresses.   Anticipated DC Date:  04/13/2014   Anticipated DC Plan:  [place]  CM consult      Choice offered to / List presented to:             Status of service:  Completed, signed off Medicare Important Message given?  NA - LOS <3 / Initial given by admissions (If response is "NO", the following Medicare IM given date fields will be blank) Date Medicare IM given:   Medicare IM given by:   Date Additional Medicare IM given:   Additional Medicare IM given by:    Discharge Disposition:  HOME/SELF CARE  Per UR Regulation:  Reviewed for med. necessity/level of care/duration of stay  If discussed at [place] of Stay Meetings, dates discussed:    Comments:  04/13/14 Ellan Lambert, RN, BSN [phone] PT/OT recommending no DME or home follow up.

## 2014-04-12 NOTE — Progress Notes (Signed)
[facility] PROGRESS NOTE  Chris Reyes MEQ:[id number] DOB: Oct 03, 1950 DOA: 04/12/2014 PCP: Gwendolyn Grant, MD Interim summary: Chris Reyes is a 64 y.o. male with Past medical history of aortic stenosis, coronary artery disease, peripheral vascular disease, COPD, pulmonary embolism, morbid obesity, essential hypertension presents with dizziness since 2 weeks. He was admitted to trh service and underwent MRI brain, did not show any acute process. His orthostatic vital signs at the time of PT evaluation were positive.  Assessment/Plan: Lightheadedness and dizziness: - admitted to telemetry. Neurological exam negative for focal deficits. CT head with out contrast and MRI brain did not show any acute process. His is orthostatic today. He received a liter bolus and is getting gentle hydration . Differential include severe aortic stenosis vs vestibular neuritis. PT evaluation . If he is still symptomatic tomorrow, please get cardiology evaluation for severe aortic stenosis.   Pulmonary embolism: Resume coumadin as per pharmacy. INR therapeutic.   COPD; No wheezing heard on exam.    Mild hyponatremia: Hydration and monitor.   CAD/ Aortic stenosis: - further management as outpatient.  Code Status: full code Family Communication: none atbedside Disposition Plan: pending further investigation   Consultants:  Physical therapy.  Procedures:  MRI BRAIN NEGATIVE FOR ACUTE PROCESS  Antibiotics:  none  HPI/Subjective: Feeling dizzy on moving.   Objective: Filed Vitals:   04/12/14 1105  BP: 130/82  Pulse: 75  Temp:   Resp:    No intake or output data in the 24 hours ending 04/12/14 1612 Filed Weights   04/12/14 0308  Weight: 120.2 kg (264 lb 15.9 oz)    Exam:   General:  Alert afebrile and oriented to place an dperson. Not in any distress  Cardiovascular: normal s1s2   Respiratory: Chest clear to auscultation bilateral no wheezing or rhonchi  Abdomen: soft  non tender and non distended bowel sounds heard  Musculoskeletal: no pedal edema.   Data Reviewed: Basic Metabolic Panel:  Recent Labs Lab 04/12/14 0049 04/12/14 0648  NA 134* 136*  K 4.2 3.9  CL 98 101  CO2 22 21  GLUCOSE 116* 110*  BUN 14 13  CREATININE 0.91 0.80  CALCIUM 9.4 8.8   Liver Function Tests:  Recent Labs Lab 04/12/14 0648  AST 34  ALT 38  ALKPHOS 55  BILITOT 0.8  PROT 7.0  ALBUMIN 3.2*   No results found for this basename: LIPASE, AMYLASE,  in the last 168 hours No results found for this basename: AMMONIA,  in the last 168 hours CBC:  Recent Labs Lab 04/12/14 0049 04/12/14 0648  WBC 8.7 6.6  NEUTROABS 7.5 5.0  HGB 17.4* 17.4*  HCT 48.8 49.4  MCV 92.2 93.9  PLT 188 158   Cardiac Enzymes:  Recent Labs Lab 04/12/14 0049  TROPONINI <0.30   BNP (last 3 results) No results found for this basename: PROBNP,  in the last 8760 hours CBG: No results found for this basename: GLUCAP,  in the last 168 hours  No results found for this or any previous visit (from the past 240 hour(s)).   Studies: Ct Head Wo Contrast  04/12/2014   CLINICAL DATA:  Dizziness.  EXAM: CT HEAD WITHOUT CONTRAST  TECHNIQUE: Contiguous axial images were obtained from the base of the skull through the vertex without intravenous contrast.  COMPARISON:  None.  FINDINGS: There is no evidence of acute infarction, mass lesion, or intra- or extra-axial hemorrhage on CT.  Prominence of the ventricles and sulci reflects mild cortical volume  loss. Mild periventricular white matter change likely reflects small vessel ischemic microangiopathy.  The brainstem and fourth ventricle are within normal limits. The basal ganglia are unremarkable in appearance. The cerebral hemispheres demonstrate grossly normal gray-white differentiation. No mass effect or midline shift is seen.  There is no evidence of fracture; visualized osseous structures are unremarkable in appearance. The orbits are within  normal limits. A mucus retention cyst or polyp is noted within the right maxillary sinus. The remaining paranasal sinuses and mastoid air cells are well-aerated. No significant soft tissue abnormalities are seen.  IMPRESSION: 1. No acute intracranial pathology seen on CT. 2. Mild cortical volume loss and scattered small vessel ischemic microangiopathy. 3. Mucus retention cyst or polyp within the right maxillary sinus.   Electronically Signed   By: Garald Balding M.D.   On: 04/12/2014 01:33   Mri Brain Without Contrast  04/12/2014   CLINICAL DATA:  63 year old male with chief complaint of dizziness. History of peripheral vascular disease, hypertension, with progression of recent/chronic dizziness. Symptoms increased with head movements. Initial encounter.  EXAM: MRI HEAD WITHOUT CONTRAST  TECHNIQUE: Multiplanar, multiecho pulse sequences of the brain and surrounding structures were obtained without intravenous contrast.  COMPARISON:  Head CT without contrast 0113 hr the same day.  FINDINGS: Cerebral volume is within normal limits for age. Major intracranial vascular flow voids are preserved. No restricted diffusion to suggest acute infarction. No midline shift, mass effect, evidence of mass lesion, ventriculomegaly, extra-axial collection or acute intracranial hemorrhage. Cervicomedullary junction and pituitary are within normal limits. Negative visualized cervical spine. Visible internal auditory structures appear normal.  Scattered mild for age mostly small foci of nonspecific cerebral white matter T2 and FLAIR hyperintensity (e.g. Series 6, image 18). Some superimposed dilated perivascular spaces. No cortical encephalomalacia identified. Deep gray matter nuclei, and cerebellum are within normal limits. Minimal to mild for age nonspecific T2 hyperintensity in the central pons.  Normal non contrast appearance of the cavernous sinus. Negative mastoids and paranasal sinuses except for mild mucosal thickening in the  right maxillary alveolar recess. Visualized orbit soft tissues are within normal limits. Visualized scalp soft tissues are within normal limits. Visualized bone marrow signal is within normal limits.  IMPRESSION: 1.  No acute intracranial abnormality. 2. Mild for age nonspecific signal changes in the brain, most commonly due to chronic small vessel disease.   Electronically Signed   By: Lars Pinks M.D.   On: 04/12/2014 11:27   Dg Chest Port 1 View  04/12/2014   CLINICAL DATA:  Dry cough, chills and dizziness. History of smoking.  EXAM: PORTABLE CHEST - 1 VIEW  COMPARISON:  Chest radiograph performed 08/30/2013  FINDINGS: The lungs are well-aerated. Mild bibasilar opacities appear grossly stable and likely reflect chronic lung changes. No definite superimposed focal airspace consolidation is seen. No pleural effusion or pneumothorax is identified, though the costophrenic angles are incompletely imaged on this study.  The cardiomediastinal silhouette is within normal limits. No acute osseous abnormalities are seen.  IMPRESSION: Mild bibasilar airspace opacities appear grossly stable and likely reflect chronic lung changes. No superimposed focal airspace consolidation seen.   Electronically Signed   By: Garald Balding M.D.   On: 04/12/2014 01:17    Scheduled Meds: .  stroke: mapping our early stages of recovery book   Does not apply Once  . aspirin EC  81 mg Oral Daily  . atorvastatin  80 mg Oral Daily  . isosorbide mononitrate  30 mg Oral Daily  . losartan  50 mg Oral Daily  . [START ON 04/13/2014] warfarin  2.5 mg Oral Once per day on Sun Mon Wed Thu Sat  . warfarin  5 mg Oral Once per day on Tue Fri  . Warfarin - Pharmacist Dosing Inpatient   Does not apply q1800   Continuous Infusions:   Principal Problem:   Dizziness Active Problems:   HYPERTENSION   COPD   CAD (coronary artery disease)   Aortic stenosis   Pulmonary emboli   RBBB   PAD (peripheral artery disease)   Obesity, Class II, BMI  35-39.9   Long term (current) use of anticoagulants    Time spent: 25 minutes.    [facility] Pager [phone] If 7PM-7AM, please contact night-coverage at [email or web address], password [facility] 04/12/2014, 4:12 PM  LOS: 0 days

## 2014-04-12 NOTE — ED Notes (Addendum)
Pt to ED via [facility] for evaluation dizziness, headache, and neck pain for the past 2 days- today pt reports he had a fever- last dose of Tylenol at 4pm today.  Pt reports blurred vision associated with symptoms.  Denies N/V.  HR 118.   Pt reports he had a fall at home today, states he became dizzy and was was not able to catch himself- denies hitting head, denies LOC.

## 2014-04-12 NOTE — Evaluation (Addendum)
Physical Therapy Evaluation Patient Details Name: Chris Reyes MRN: [id number] DOB: 03-11-1951 Today's Date: 04/12/2014   History of Present Illness  Chris Reyes is a 63 y.o. male with Past medical history of aortic stenosis, coronary artery disease, peripheral vascular disease, COPD, pulmonary embolism, morbid obesity, essential hypertension.  Clinical Impression  Pt presents with c/o light headedness with any mobility during this session.  Note pt did get orthostatic with me this morning; supine BP 139/84, sitting BP 130/77, standing BP 112/74 and during gait 110/74.  Did do vestibular exam as follows: Eye Alignment WNL  Spontaneous  Nystagmus None noted  Gaze holding nystagmus None noted  Smooth pursuit WNL  Oculomotor WNL, except during convergence noted L eye did not adduct with R eye  Saccades Slight lag w/ L eye  VOR slow WNL  Head Thrust Test WNL  Head Shaking Nystagmus n/a  Rt. Marye Round See below for remainder  Lt. Hallpike Dix   Rt. Roll Test   Lt. Roll Test    Motion sensitivity       Unable to get pt into true Dix hallpike or vertebral artery test during session due to pain in lower back and pain in neck with movements.  Did have pt do modified vertebral artery and pt reports feeling "weird" but could not report s/s of dizziness, light headedness, double vision, nausea, etc.  When lying down in bed, did not note any nystagmus or with rolling over in bed and no sensation of "room spinning."  Recommend seeing results of MRI and possible MRA (discussed with MD) to see vasculature and MD to provide pt with fluids to increase BP.  Will re-assess tomorrow.      Follow Up Recommendations Home health PT;Outpatient PT (pending pts progress and ability to get to OP)    Equipment Recommendations  Other (comment) (TBD)    Recommendations for Other Services       Precautions / Restrictions Precautions Precautions: Fall Precaution Comments: pt w/ staggering gait, c/o  light headedness Restrictions Weight Bearing Restrictions: No      Mobility  Bed Mobility Overal bed mobility: Modified Independent             General bed mobility comments: Pt able to get himself to EOB, however with heavy reliance on UEs to side rail.   Transfers Overall transfer level: Needs assistance Equipment used: None Transfers: Sit to/from Stand Sit to Stand: Supervision         General transfer comment: S for safety with noted reliance on back of legs on bed to keep balanced in standing.    Ambulation/Gait Ambulation/Gait assistance: Min assist Ambulation Distance (Feet): 10 Feet Assistive device: None (used dynamap for support w/ short distance gait) Gait Pattern/deviations: Step-to pattern;Staggering left;Staggering right;Trunk flexed     General Gait Details: Pt only agreeable to ambulate in room, however note increased staggering gait and need for UE support to prevent LOB.  Pt did become orthostatic with PT during standing and gait.  See comment section above in note.   Stairs            Wheelchair Mobility    Modified Rankin (Stroke Patients Only)       Balance Overall balance assessment: Needs assistance Sitting-balance support: Feet supported;Single extremity supported Sitting balance-Leahy Scale: Fair   Postural control: Posterior lean;Left lateral lean Standing balance support: Single extremity supported Standing balance-Leahy Scale: Poor  Pertinent Vitals/Pain Pain Assessment: 0-10 Pain Score: 8  Pain Descriptors / Indicators: Aching Pain Intervention(s): Monitored during session    Home Living Family/patient expects to be discharged to:: Private residence Living Arrangements: Spouse/significant other Available Help at Discharge: Family;Available 24 hours/day Type of Home: House Home Access: Stairs to enter Entrance Stairs-Rails: None Entrance Stairs-Number of Steps: 3 Home  Layout: One level        Prior Function Level of Independence: Independent               Hand Dominance        Extremity/Trunk Assessment               Lower Extremity Assessment: Overall WFL for tasks assessed      Cervical / Trunk Assessment: Kyphotic (forward, rounded shoulders, difficulty maintaining midline posture)  Communication   Communication: No difficulties  Cognition Arousal/Alertness: Awake/alert Behavior During Therapy: WFL for tasks assessed/performed Overall Cognitive Status: Within Functional Limits for tasks assessed                      General Comments      Exercises        Assessment/Plan    PT Assessment Patient needs continued PT services  PT Diagnosis Difficulty walking;Acute pain   PT Problem List Decreased balance;Decreased mobility;Decreased coordination;Pain  PT Treatment Interventions DME instruction;Gait training;Stair training;Functional mobility training;Therapeutic activities;Therapeutic exercise;Neuromuscular re-education;Balance training;Patient/family education   PT Goals (Current goals can be found in the Care Plan section) Acute Rehab PT Goals Patient Stated Goal: to figure out what is causing dizziness PT Goal Formulation: With patient Time For Goal Achievement: 04/19/14 Potential to Achieve Goals: Good    Frequency Min 3X/week   Barriers to discharge        Co-evaluation               End of Session   Activity Tolerance: Patient limited by pain;Other (comment) (pain and light headedness) Patient left: in bed;with call bell/phone within reach Nurse Communication: Mobility status;Other (comment) (results of BP and vestib exam)    Functional Assessment Tool Used: Clinical judgement Functional Limitation: Mobility: Walking and moving around Mobility: Walking and Moving Around Current Status (B0175): At least 1 percent but less than 20 percent impaired, limited or restricted Mobility: Walking  and Moving Around Goal Status [phone]): 0 percent impaired, limited or restricted    Time: 0925-1000 PT Time Calculation (min): 35 min   Charges:   PT Evaluation $Initial PT Evaluation Tier I: 1 Procedure PT Treatments $Neuromuscular Re-education: 23-37 mins   PT G Codes:   Functional Assessment Tool Used: Clinical judgement Functional Limitation: Mobility: Walking and moving around    [place] 04/12/2014, 11:36 AM

## 2014-04-13 LAB — BASIC METABOLIC PANEL
Anion gap: 10 (ref 5–15)
BUN: 13 mg/dL (ref 6–23)
CO2: 25 meq/L (ref 19–32)
Calcium: 9.1 mg/dL (ref 8.4–10.5)
Chloride: 102 mEq/L (ref 96–112)
Creatinine, Ser: 0.97 mg/dL (ref 0.50–1.35)
GFR calc Af Amer: >90 mL/min (ref >90)
GFR calc non Af Amer: 85 mL/min — ABNORMAL LOW (ref >90)
GLUCOSE: 116 mg/dL — AB (ref 70–99)
POTASSIUM: 4.4 meq/L (ref 3.7–5.3)
SODIUM: 137 meq/L (ref 137–147)

## 2014-04-13 LAB — GLUCOSE, CAPILLARY
GLUCOSE-CAPILLARY: 132 mg/dL — AB (ref 70–99)
GLUCOSE-CAPILLARY: 114 mg/dL — AB (ref 70–99)
Glucose-Capillary: 105 mg/dL — ABNORMAL HIGH (ref 70–99)

## 2014-04-13 LAB — PROTIME-INR
INR: 2.24 — ABNORMAL HIGH (ref 0.00–1.49)
Prothrombin Time: 24.8 seconds — ABNORMAL HIGH (ref 11.6–15.2)

## 2014-04-13 MED ORDER — ASPIRIN 81 MG PO TBEC: 81.0000 mg | DELAYED_RELEASE_TABLET | Freq: Every day | ORAL | Status: AC

## 2014-04-13 NOTE — Progress Notes (Signed)
Patient sleeping and had a 10 beat run of v-tach then back to s.r. Patient has freq. pvc's at times.

## 2014-04-13 NOTE — Progress Notes (Signed)
Physical Therapy Treatment Patient Details Name: Chris Reyes MRN: [id number] DOB: 1951/07/31 Today's Date: 04/13/2014    History of Present Illness Chris Reyes is a 63 y.o. male with Past medical history of aortic stenosis, coronary artery disease, peripheral vascular disease, COPD, pulmonary embolism, morbid obesity, essential hypertension.    PT Comments    Pt no with no report of dizziness or pain this date. Pt much improved ambulation tolerance. Pt with previous L LE limp from back pain resulting in mild balance impairment however demo'd no episodes of LOB during DGI, 200' of amb without RW and stair negotiation. Pt progressing well towards all goals, PT freq d/c to 1x/wk.   Follow Up Recommendations  No PT follow up;Supervision - Intermittent     Equipment Recommendations  None recommended by PT    Recommendations for Other Services       Precautions / Restrictions Precautions Precautions: Fall Restrictions Weight Bearing Restrictions: No    Mobility  Bed Mobility                  Transfers Overall transfer level: Modified independent Equipment used: None Transfers: Sit to/from Stand Sit to Stand: Modified independent (Device/Increase time)         General transfer comment: pt with good technique, no instability  Ambulation/Gait Ambulation/Gait assistance: Min guard Ambulation Distance (Feet): 200 Feet Assistive device: None Gait Pattern/deviations: Step-through pattern;Antalgic     General Gait Details: pt with no episodes of LOB however does have a L LE limp due to back pain. pt reports this to be normal   Stairs Stairs: Yes Stairs assistance: Min guard Stair Management: One rail Right Number of Stairs: 3 General stair comments: pt with good reciprocal pattern  Wheelchair Mobility    Modified Rankin (Stroke Patients Only)       Balance                             High level balance activites:  (tandem  ambulation) High Level Balance Comments: pt able to complete 10 feet of tandem walking, pt unsteady but able to maintain balance using bracing strategies with UEs    Cognition Arousal/Alertness: Awake/alert Behavior During Therapy: WFL for tasks assessed/performed Overall Cognitive Status: Within Functional Limits for tasks assessed                      Exercises      General Comments        Pertinent Vitals/Pain Pain Assessment: No/denies pain    Home Living                      Prior Function            PT Goals (current goals can now be found in the care plan section) Progress towards PT goals: Progressing toward goals    Frequency  Min 1X/week    PT Plan Discharge plan needs to be updated;Frequency needs to be updated    Co-evaluation             End of Session Equipment Utilized During Treatment: Gait belt Activity Tolerance: Patient tolerated treatment well Patient left: in chair;with call bell/phone within reach     Time: 1120-1148 PT Time Calculation (min): 28 min  Charges:  $Gait Training: 8-22 mins $Neuromuscular Re-education: 8-22 mins  G Codes:      Kingsley Callander 04/13/2014, 12:24 PM  Kittie Plater, PT, DPT Pager #: [phone] Office #: [phone]

## 2014-04-13 NOTE — Evaluation (Signed)
Occupational Therapy Evaluation Patient Details Name: Chris Reyes MRN: [id number] DOB: June 06, 1951 Today's Date: 04/13/2014    History of Present Illness Chris Reyes is a 63 y.o. male with Past medical history of aortic stenosis, coronary artery disease, peripheral vascular disease, COPD, pulmonary embolism, morbid obesity, essential hypertension.   Clinical Impression   Patient evaluated by Occupational Therapy with no further acute OT needs identified. All education has been completed and the patient has no further questions. See below for any follow-up Occupational Therapy or equipment needs. OT to sign off. Thank you for referral.      Follow Up Recommendations  No OT follow up    Equipment Recommendations  None recommended by OT    Recommendations for Other Services       Precautions / Restrictions Precautions Precautions: Fall      Mobility Bed Mobility Overal bed mobility: Modified Independent                Transfers Overall transfer level: Modified independent                    Balance                                            ADL Overall ADL's : Modified independent                                             Vision                     Perception     Praxis      Pertinent Vitals/Pain Pain Assessment: No/denies pain     Hand Dominance Left   Extremity/Trunk Assessment Upper Extremity Assessment Upper Extremity Assessment: Overall WFL for tasks assessed   Lower Extremity Assessment Lower Extremity Assessment: Defer to PT evaluation   Cervical / Trunk Assessment Cervical / Trunk Assessment: Kyphotic   Communication     Cognition Arousal/Alertness: Awake/alert Behavior During Therapy: WFL for tasks assessed/performed Overall Cognitive Status: Within Functional Limits for tasks assessed                     General Comments       Exercises       Shoulder  Instructions      Home Living Family/patient expects to be discharged to:: Private residence Living Arrangements: Spouse/significant other Available Help at Discharge: Family;Available 24 hours/day Type of Home: House Home Access: Stairs to enter [employer] of Steps: 3 Entrance Stairs-Rails: None Home Layout: One level     Bathroom Shower/Tub: Occupational psychologist: Standard     Home Equipment: Shower seat;Grab bars - tub/shower;Adaptive equipment Adaptive Equipment: Reacher        Prior Functioning/Environment Level of Independence: Independent             OT Diagnosis:     OT Problem List:     OT Treatment/Interventions:      OT Goals(Current goals can be found in the care plan section)    OT Frequency:     Barriers to D/C:            Co-evaluation  End of Session Nurse Communication: Mobility status;Precautions  Activity Tolerance: Patient tolerated treatment well Patient left: in bed;with call bell/phone within reach   Time: 1427-1435 OT Time Calculation (min): 8 min Charges:  OT General Charges $OT Visit: 1 Procedure OT Evaluation $Initial OT Evaluation Tier I: 1 Procedure G-Codes: OT G-codes **NOT FOR INPATIENT CLASS** Functional Assessment Tool Used: clinical judgemnt Functional Limitation: Self care Self Care Current Status (Q3335): 0 percent impaired, limited or restricted Self Care Goal Status (K5625): 0 percent impaired, limited or restricted Self Care Discharge Status (W3893): 0 percent impaired, limited or restricted  Parke Poisson B 04/13/2014, 3:38 PM Pager: [phone]

## 2014-04-13 NOTE — Discharge Summary (Signed)
Physician Discharge Summary  Chris Reyes OZH:[id number] DOB: [birth year] DOA: 04/12/2014  PCP: Gwendolyn Grant, MD  Admit date: 04/12/2014 Discharge date: 04/13/2014  Time spent: 35 minutes  Recommendations for Outpatient Follow-up:  Cardiology for AS repair   Discharge Diagnoses:  Principal Problem:   Dizziness Active Problems:   HYPERTENSION   COPD   CAD (coronary artery disease)   Aortic stenosis   Pulmonary emboli   RBBB   PAD (peripheral artery disease)   Obesity, Class II, BMI 35-39.9   Long term (current) use of anticoagulants   Discharge Condition: improved  Diet recommendation: cardiac  Filed Weights   04/12/14 0308  Weight: 120.2 kg (264 lb 15.9 oz)    History of present illness:  Chris Reyes is a 63 y.o. male with Past medical history of aortic stenosis, coronary artery disease, peripheral vascular disease, COPD, pulmonary embolism, morbid obesity, essential hypertension.  The patient presented with complaints of dizziness. He mentions that she has chronic dizziness on and off prednisone since last 2-3 weeks. But since last one day background of her on his lunch he started having complaints of dizziness which is worse than his usual. He denies it being worked equal. But he mentions this symptoms occur whenever he moves his head. Also his symptoms occur when he is changing his position. His symptoms do not occur when he is lying down and moves his head. He denies any chest pain or palpitation shortness of breath, nausea, vomiting, abdominal pain, diarrhea, decreased oral intake, changes in his medications, focal deficit.  He also denies any was seen out episodes or falls.  He has been trying to lose weight on his own as he has been recommended for aortic wall surgery   Hospital Course:  Lightheadedness and dizziness:  - admitted to telemetry. Neurological exam negative for focal deficits. CT head with out contrast and MRI brain did not show any acute process.   Was orthostatic- received a liter bolus - no further symptoms  PT evaluation- no further follow up.   Pulmonary embolism:  Resume coumadin as per pharmacy. INR therapeutic.   COPD;  Stable .  Mild hyponatremia:  Hydration and monitor.   CAD/ Aortic stenosis:  - further management as outpatient   Procedures:    Consultations:  none  Discharge Exam: Filed Vitals:   04/13/14 1328  BP: 134/79  Pulse: 67  Temp: 98.5 F (36.9 C)  Resp: 20    General: A+Ox3, NAD Cardiovascular: rrr Respiratory: clear  Discharge Instructions You were cared for by a hospitalist during your hospital stay. If you have any questions about your discharge medications or the care you received while you were in the hospital after you are discharged, you can call the unit and asked to speak with the hospitalist on call if the hospitalist that took care of you is not available. Once you are discharged, your primary care physician will handle any further medical issues. Please note that NO REFILLS for any discharge medications will be authorized once you are discharged, as it is imperative that you return to your primary care physician (or establish a relationship with a primary care physician if you do not have one) for your aftercare needs so that they can reassess your need for medications and monitor your lab values.      Discharge Instructions   Diet - low sodium heart healthy    Complete by:  As directed      Discharge instructions    Complete  by:  As directed   Follow up with cardiology for aortic stenosis- strongly consider having valve fixed     Increase activity slowly    Complete by:  As directed             Medication List         acetaminophen 500 MG tablet  Commonly known as:  TYLENOL  Take 1,500 mg by mouth every 6 (six) hours as needed (pain).     aspirin 81 MG EC tablet  Take 1 tablet (81 mg total) by mouth daily.     atorvastatin 80 MG tablet  Commonly known as:   LIPITOR  Take 1 tablet (80 mg total) by mouth daily.     CoQ10 100 MG Caps  Take 1 capsule by mouth.     fish oil-omega-3 fatty acids 1000 MG capsule  Take 1 g by mouth daily.     fluticasone 50 MCG/ACT nasal spray  Commonly known as:  FLONASE  Place 1 spray into both nostrils daily.     ibuprofen 200 MG tablet  Commonly known as:  ADVIL,MOTRIN  Take 600 mg by mouth every 6 (six) hours as needed (pain).     isosorbide mononitrate 30 MG 24 hr tablet  Commonly known as:  IMDUR  Take 30 mg by mouth daily.     losartan 50 MG tablet  Commonly known as:  COZAAR  Take 1 tablet (50 mg total) by mouth daily.     warfarin 5 MG tablet  Commonly known as:  COUMADIN  Take 2.5-5 mg by mouth daily. Takes 5mg  on Tues and Thursdays and half a tab 2.5 mg Mon. Wed, Fri, Sat, Sun       No Known Allergies    The results of significant diagnostics from this hospitalization (including imaging, microbiology, ancillary and laboratory) are listed below for reference.    Significant Diagnostic Studies: Ct Head Wo Contrast  04/12/2014   CLINICAL DATA:  Dizziness.  EXAM: CT HEAD WITHOUT CONTRAST  TECHNIQUE: Contiguous axial images were obtained from the base of the skull through the vertex without intravenous contrast.  COMPARISON:  None.  FINDINGS: There is no evidence of acute infarction, mass lesion, or intra- or extra-axial hemorrhage on CT.  Prominence of the ventricles and sulci reflects mild cortical volume loss. Mild periventricular white matter change likely reflects small vessel ischemic microangiopathy.  The brainstem and fourth ventricle are within normal limits. The basal ganglia are unremarkable in appearance. The cerebral hemispheres demonstrate grossly normal gray-white differentiation. No mass effect or midline shift is seen.  There is no evidence of fracture; visualized osseous structures are unremarkable in appearance. The orbits are within normal limits. A mucus retention cyst or polyp  is noted within the right maxillary sinus. The remaining paranasal sinuses and mastoid air cells are well-aerated. No significant soft tissue abnormalities are seen.  IMPRESSION: 1. No acute intracranial pathology seen on CT. 2. Mild cortical volume loss and scattered small vessel ischemic microangiopathy. 3. Mucus retention cyst or polyp within the right maxillary sinus.   Electronically Signed   By: Garald Balding M.D.   On: 04/12/2014 01:33   Mri Brain Without Contrast  04/12/2014   CLINICAL DATA:  63 year old male with chief complaint of dizziness. History of peripheral vascular disease, hypertension, with progression of recent/chronic dizziness. Symptoms increased with head movements. Initial encounter.  EXAM: MRI HEAD WITHOUT CONTRAST  TECHNIQUE: Multiplanar, multiecho pulse sequences of the brain and surrounding structures were obtained without  intravenous contrast.  COMPARISON:  Head CT without contrast 0113 hr the same day.  FINDINGS: Cerebral volume is within normal limits for age. Major intracranial vascular flow voids are preserved. No restricted diffusion to suggest acute infarction. No midline shift, mass effect, evidence of mass lesion, ventriculomegaly, extra-axial collection or acute intracranial hemorrhage. Cervicomedullary junction and pituitary are within normal limits. Negative visualized cervical spine. Visible internal auditory structures appear normal.  Scattered mild for age mostly small foci of nonspecific cerebral white matter T2 and FLAIR hyperintensity (e.g. Series 6, image 18). Some superimposed dilated perivascular spaces. No cortical encephalomalacia identified. Deep gray matter nuclei, and cerebellum are within normal limits. Minimal to mild for age nonspecific T2 hyperintensity in the central pons.  Normal non contrast appearance of the cavernous sinus. Negative mastoids and paranasal sinuses except for mild mucosal thickening in the right maxillary alveolar recess. Visualized  orbit soft tissues are within normal limits. Visualized scalp soft tissues are within normal limits. Visualized bone marrow signal is within normal limits.  IMPRESSION: 1.  No acute intracranial abnormality. 2. Mild for age nonspecific signal changes in the brain, most commonly due to chronic small vessel disease.   Electronically Signed   By: Lars Pinks M.D.   On: 04/12/2014 11:27   Dg Chest Port 1 View  04/12/2014   CLINICAL DATA:  Dry cough, chills and dizziness. History of smoking.  EXAM: PORTABLE CHEST - 1 VIEW  COMPARISON:  Chest radiograph performed 08/30/2013  FINDINGS: The lungs are well-aerated. Mild bibasilar opacities appear grossly stable and likely reflect chronic lung changes. No definite superimposed focal airspace consolidation is seen. No pleural effusion or pneumothorax is identified, though the costophrenic angles are incompletely imaged on this study.  The cardiomediastinal silhouette is within normal limits. No acute osseous abnormalities are seen.  IMPRESSION: Mild bibasilar airspace opacities appear grossly stable and likely reflect chronic lung changes. No superimposed focal airspace consolidation seen.   Electronically Signed   By: Garald Balding M.D.   On: 04/12/2014 01:17    Microbiology: No results found for this or any previous visit (from the past 240 hour(s)).   Labs: Basic Metabolic Panel:  Recent Labs Lab 04/12/14 0049 04/12/14 0648 04/13/14 0955  NA 134* 136* 137  K 4.2 3.9 4.4  CL 98 101 102  CO2 22 21 25   GLUCOSE 116* 110* 116*  BUN 14 13 13   CREATININE 0.91 0.80 0.97  CALCIUM 9.4 8.8 9.1   Liver Function Tests:  Recent Labs Lab 04/12/14 0648  AST 34  ALT 38  ALKPHOS 55  BILITOT 0.8  PROT 7.0  ALBUMIN 3.2*   No results found for this basename: LIPASE, AMYLASE,  in the last 168 hours No results found for this basename: AMMONIA,  in the last 168 hours CBC:  Recent Labs Lab 04/12/14 0049 04/12/14 0648  WBC 8.7 6.6  NEUTROABS 7.5 5.0   HGB 17.4* 17.4*  HCT 48.8 49.4  MCV 92.2 93.9  PLT 188 158   Cardiac Enzymes:  Recent Labs Lab 04/12/14 0049  TROPONINI <0.30   BNP: BNP (last 3 results) No results found for this basename: PROBNP,  in the last 8760 hours CBG:  Recent Labs Lab 04/12/14 1700 04/12/14 2108 04/13/14 0557 04/13/14 1113  GLUCAP 90 116* 105* 132*       Signed:  Vint Pola  [facility] 04/13/2014, 2:45 PM

## 2014-04-13 NOTE — Progress Notes (Signed)
[place] for Warfarin  Indication: atrial fibrillation  No Known Allergies  Patient Measurements: Height: 5\' 11"  (180.3 cm) Weight: 264 lb 15.9 oz (120.2 kg) IBW/kg (Calculated) : 75.3  Vital Signs: Temp: 98.6 F (37 C) (08/12 0446) Temp src: Oral (08/12 0446) BP: 131/68 mmHg (08/12 0446) Pulse Rate: 65 (08/12 0446)  Labs:  Recent Labs  04/12/14 0049 04/12/14 0648 04/13/14 0502  HGB 17.4* 17.4*  --   HCT 48.8 49.4  --   PLT 188 158  --   LABPROT 26.1*  --  24.8*  INR 2.39*  --  2.24*  CREATININE 0.91 0.80  --   TROPONINI <0.30  --   --     Medical History: Past Medical History  Diagnosis Date  . Hypertension   . Hernia   . COPD (chronic obstructive pulmonary disease)   . History of blood clots 2007    in lungs  . CAD (coronary artery disease)     Catheterization, [place], July, 2006, EF 45%,  50% LAD, 50% RCA, 90% marginal, possible collaterals from LAD  to a branch of the PDA, medical therapy recommended  . GI bleed     2006, no significant abnormalities per the patient  . PAD (peripheral artery disease)     Arterial leg Dopplers, April, 2014, normal  . Ejection fraction     EF 45%, catheterization, 2006  //   EF 55%, echo, March, 2012, wall abnormality at the base of the inferior wall  . Aortic stenosis     severe echo 01/2013- cannot rule out bicuspid aortic valve  //   moderately severe, echo,  March, 2012 ( progression since 2009)  . RBBB   . Pulmonary emboli     Significant, 2007, Coumadin therapy  . Warfarin anticoagulation     History pulmonary emboli  . Bradycardia   . Tobacco abuse   . Carotid artery disease     Doppler, March, 2012,  0-39% bilateral  . Dyslipidemia     Medications:  Warfarin PTA Dose: 5 mg Tues/Fri, 2.5 mg all other days, from outpatient anti-coagulation notes  Assessment: Warfarin PTA for afib. INR remains therapeutic on home regimen  Goal of Therapy:  INR 2-3 Monitor  platelets by anticoagulation protocol: Yes   Plan:  -Warfarin per home dose -Daily PT/INR, adjust as needed -Monitor for bleeding  Syrina Wake Poteet 04/13/2014,8:19 AM

## 2014-04-13 NOTE — Discharge Instructions (Addendum)
Atherosclerosis Atherosclerosis, or hardening of the arteries, is the buildup of plaque within the major arteries in the body. Plaque is made up of fats (lipids), cholesterol, calcium, and fibrous tissue. Plaque can narrow or block blood flow within an artery. Plaque can break off and cause damage to the affected organ. Plaque can also "rupture." When plaque ruptures within an artery, a clot can form, causing a sudden (acute) blockage of the artery. Untreated atherosclerosis can cause serious health problems or death.  RISK FACTORS  High cholesterol levels.  Smoking.  Obesity.  Lack of activity or exercise.  Eating a diet high in saturated fat.  Family history.  Diabetes. SIGNS AND SYMPTOMS  Symptoms of atherosclerosis can occur when blood flow to an artery is slowed or blocked. Severity and onset of symptoms depends on how extensive the narrowing or blockage is. A sudden plaque rupture can bring immediate, life-threatening symptoms. Atherosclerosis can affect different arteries in the body, for example:  Coronary arteries. The coronary arteries supply the heart with blood. When the coronary arteries are narrowed or blocked from atherosclerosis, this is known as coronary artery disease (CAD). CAD can cause a heart attack. Common heart attack symptoms include:  Chest pain or pain that radiates to the neck, arm, jaw, or in the upper, middle back (mid-scapular pain).  Shortness of breath without cause.  Profuse sweating while at rest.  Irregular heartbeats.  Nausea or gastrointestinal upset.  Carotid arteries. The carotid arteries supply the brain with blood. They are located on each side of your neck. When blood flow to these arteries is slowed or blocked, a transient ischemic attack (TIA) or stroke can occur. A TIA is considered a "mini-stroke" or "warning stroke." TIA symptoms are the same as stroke symptoms, but they are temporary and last less than 24 hours. A stroke can cause  permanent damage or death. Common TIA and stroke symptoms include:  Sudden numbness or weakness to one side of your body, such as the face, arm, or leg.  Sudden confusion or trouble speaking or understanding.  Sudden trouble seeing out of one or both eyes.  Sudden trouble walking, loss of balance, or dizziness.  Sudden, severe headache with no known cause.  Arteries in the legs. When arteries in the lower legs become narrowed or blocked, this is known as peripheral vascular disease (PVD). PVD can cause a symptom called claudication. Claudication is pain or a burning feeling in your legs when walking or exercising and usually goes away with rest. Very severe PVD can cause pain in your legs while at rest.  Renal arteries. The renal arteries supply the kidneys with blood. Blockage of the renal arteries can cause a decline in kidney function or high blood pressure (hypertension).  Gastrointestinal arteries (mesenteric circulation). Abdominal pain may occur after eating. DIAGNOSIS  Your health care provider may perform the following tests to diagnose atherosclerosis:  Blood tests.  Stress test.  Echocardiogram.  Nuclear scan.  Ankle/brachial index.  Ultrasonography.  Computed tomography (CT) scan.  Angiography. TREATMENT  Atherosclerosis treatment includes the following:  Lifestyle changes such as:  Quitting smoking. Your health care provider can help you with smoking cessation.  Eating a diet low in saturated fat. A registered dietitian can educate you on healthy food options, such as helping you understand the difference between good fat and bad fat.  Following an exercise program approved by your health care provider.  Maintaining a healthy weight. Lose weight as approved by your health care provider.  Have  your cholesterol levels checked as directed by your health care provider.  Medicines. Cholesterol medicines can help slow or stop the progression of  atherosclerosis.  Different procedural or surgical interventions to treat atherosclerosis include:  Balloon angioplasty. The technical name for balloon angioplasty is percutaneous transluminal angioplasty (PTA). In this procedure, a catheter with a small balloon at the tip is inserted through the blocked or narrowed artery. The balloon is then inflated. When the balloon is inflated, the fatty plaque is compressed against the artery wall, allowing better blood flow within the artery.  Balloon angioplasty and stenting. In this procedure, balloon angioplasty is combined with a stenting procedure. A stent is a small, metal mesh tube that keeps the artery open. After the artery is opened up by the balloon technique, the stent is then deployed. The stent is permanent.  Open heart surgery or bypass surgery. To perform this type of surgery, a healthy vessel is first "harvested" from either the leg or arm. The harvested vessel is then used to "bypass" the blocked atherosclerotic vessel so new blood flow can be established.  Atherectomy. Atherectomy is a procedure that uses a catheter with a sharp blade to remove plaque from an artery. A chamber in the catheter collects the plaque.  Endarterectomy. An endarterectomy is a surgical procedure where a surgeon removes plaque from an artery.  Amputation. When blockages in the lower legs are very severe and circulation cannot be restored, amputation may be required. SEEK IMMEDIATE MEDICAL CARE IF:  You are having heart attack symptoms, such as:  Chest pain or pain that radiates to the neck, arm, jaw, or in the upper, middle back (mid-scapular pain).  Shortness of breath without cause.  Profuse sweating while at rest.  Irregular heartbeats.  Nausea or gastrointestinal upset.  You are having stroke symptoms, such as sudden:  Numbness or weakness to one side of your body, such as the face, arm, or leg.  Confusion or trouble speaking or  understanding.  Trouble seeing out of one or both eyes.  Trouble walking, loss of balance, or dizziness.  Severe headache with no known cause.  Your hands or feet are bluish, cold, or you have pain in them.  You have bad abdominal pain after eating. Symptoms of heart attack or stroke may represent a serious problem that is an emergency. Do not wait to see if the symptoms will go away. Get medical help right away. Call your local emergency services (911 in the [place] Do not drive yourself to the hospital. Document Released: 11/09/2003 Document Revised: 01/03/2014 Document Reviewed: 10/22/2011 [facility] Patient Information 2015 [place], [place]. This information is not intended to replace advice given to you by your health care provider. Make sure you discuss any questions you have with your health care provider.  Cholesterol Cholesterol is a white, waxy, fat-like substance needed by your body in small amounts. The liver makes all the cholesterol you need. Cholesterol is carried from the liver by the blood through the blood vessels. Deposits of cholesterol (plaque) may build up on blood vessel walls. These make the arteries narrower and stiffer. Cholesterol plaques increase the risk for heart attack and stroke.  You cannot feel your cholesterol level even if it is very high. The only way to know it is high is with a blood test. Once you know your cholesterol levels, you should keep a record of the test results. Work with your health care provider to keep your levels in the desired range.  WHAT DO THE  RESULTS MEAN?  Total cholesterol is a rough measure of all the cholesterol in your blood.   LDL is the so-called bad cholesterol. This is the type that deposits cholesterol in the walls of the arteries. You want this level to be low.   HDL is the good cholesterol because it cleans the arteries and carries the LDL away. You want this level to be high.  Triglycerides are fat that the body can  either burn for energy or store. High levels are closely linked to heart disease.  WHAT ARE THE DESIRED LEVELS OF CHOLESTEROL?  Total cholesterol below 200.   LDL below 100 for people at risk, below 70 for those at very high risk.   HDL above 50 is good, above 60 is best.   Triglycerides below 150.  HOW CAN I LOWER MY CHOLESTEROL?  Diet. Follow your diet programs as directed by your health care provider.   Choose fish or white meat chicken and [place], roasted or baked. Limit fatty cuts of red meat, fried foods, and processed meats, such as sausage and lunch meats.   Eat lots of fresh fruits and vegetables.  Choose whole grains, beans, pasta, potatoes, and cereals.   Use only small amounts of olive, corn, or canola oils.   Avoid butter, mayonnaise, shortening, or palm kernel oils.  Avoid foods with trans fats.   Drink skim or nonfat milk and eat low-fat or nonfat yogurt and cheeses. Avoid whole milk, cream, ice cream, egg yolks, and full-fat cheeses.   Healthy desserts include angel food cake, ginger snaps, animal crackers, hard candy, popsicles, and low-fat or nonfat frozen yogurt. Avoid pastries, cakes, pies, and cookies.   Exercise. Follow your exercise programs as directed by your health care provider.   A regular program helps decrease LDL and raise HDL.   A regular program helps with weight control.   Do things that increase your activity level like gardening, walking, or taking the stairs. Ask your health care provider about how you can be more active in your daily life.   Medicine. Take medicine only as directed by your health care provider.   Medicine may be prescribed by your health care provider to help lower cholesterol and decrease the risk for heart disease.   If you have several risk factors, you may need medicine even if your levels are normal. Document Released: 05/14/2001 Document Revised: 01/03/2014 Document Reviewed:  06/02/2013 [facility] Patient Information 2015 [place], [place]. This information is not intended to replace advice given to you by your health care provider. Make sure you discuss any questions you have with your health care provider.  DASH Eating Plan DASH stands for "Dietary Approaches to Stop Hypertension." The DASH eating plan is a healthy eating plan that has been shown to reduce high blood pressure (hypertension). Additional health benefits may include reducing the risk of type 2 diabetes mellitus, heart disease, and stroke. The DASH eating plan may also help with weight loss. WHAT DO I NEED TO KNOW ABOUT THE DASH EATING PLAN? For the DASH eating plan, you will follow these general guidelines:  Choose foods with a percent daily value for sodium of less than 5% (as listed on the food label).  Use salt-free seasonings or herbs instead of table salt or sea salt.  Check with your health care provider or pharmacist before using salt substitutes.  Eat lower-sodium products, often labeled as "lower sodium" or "no salt added."  Eat fresh foods.  Eat more vegetables, fruits, and low-fat  dairy products.  Choose whole grains. Look for the word "whole" as the first word in the ingredient list.  Choose fish and skinless chicken or [place] more often than red meat. Limit fish, poultry, and meat to 6 oz (170 g) each day.  Limit sweets, desserts, sugars, and sugary drinks.  Choose heart-healthy fats.  Limit cheese to 1 oz (28 g) per day.  Eat more home-cooked food and less restaurant, buffet, and fast food.  Limit fried foods.  Cook foods using methods other than frying.  Limit canned vegetables. If you do use them, rinse them well to decrease the sodium.  When eating at a restaurant, ask that your food be prepared with less salt, or no salt if possible. WHAT FOODS CAN I EAT? Seek help from a dietitian for individual calorie needs. Grains Whole grain or whole wheat bread. Brown rice.  Whole grain or whole wheat pasta. Quinoa, bulgur, and whole grain cereals. Low-sodium cereals. Corn or whole wheat flour tortillas. Whole grain cornbread. Whole grain crackers. Low-sodium crackers. Vegetables Fresh or frozen vegetables (raw, steamed, roasted, or grilled). Low-sodium or reduced-sodium tomato and vegetable juices. Low-sodium or reduced-sodium tomato sauce and paste. Low-sodium or reduced-sodium canned vegetables.  Fruits All fresh, canned (in natural juice), or frozen fruits. Meat and Other Protein Products Ground beef (85% or leaner), grass-fed beef, or beef trimmed of fat. Skinless chicken or [place]. Ground chicken or [place]. Pork trimmed of fat. All fish and seafood. Eggs. Dried beans, peas, or lentils. Unsalted nuts and seeds. Unsalted canned beans. Dairy Low-fat dairy products, such as skim or 1% milk, 2% or reduced-fat cheeses, low-fat ricotta or cottage cheese, or plain low-fat yogurt. Low-sodium or reduced-sodium cheeses. Fats and Oils Tub margarines without trans fats. Light or reduced-fat mayonnaise and salad dressings (reduced sodium). Avocado. Safflower, olive, or canola oils. Natural peanut or almond butter. Other Unsalted popcorn and pretzels. The items listed above may not be a complete list of recommended foods or beverages. Contact your dietitian for more options. WHAT FOODS ARE NOT RECOMMENDED? Grains White bread. White pasta. White rice. Refined cornbread. Bagels and croissants. Crackers that contain trans fat. Vegetables Creamed or fried vegetables. Vegetables in a cheese sauce. Regular canned vegetables. Regular canned tomato sauce and paste. Regular tomato and vegetable juices. Fruits Dried fruits. Canned fruit in light or heavy syrup. Fruit juice. Meat and Other Protein Products Fatty cuts of meat. Ribs, chicken wings, bacon, sausage, bologna, salami, chitterlings, fatback, hot dogs, bratwurst, and packaged luncheon meats. Salted nuts and seeds. Canned  beans with salt. Dairy Whole or 2% milk, cream, half-and-half, and cream cheese. Whole-fat or sweetened yogurt. Full-fat cheeses or blue cheese. Nondairy creamers and whipped toppings. Processed cheese, cheese spreads, or cheese curds. Condiments Onion and garlic salt, seasoned salt, table salt, and sea salt. Canned and packaged gravies. Worcestershire sauce. Tartar sauce. Barbecue sauce. Teriyaki sauce. Soy sauce, including reduced sodium. Steak sauce. Fish sauce. Oyster sauce. Cocktail sauce. Horseradish. Ketchup and mustard. Meat flavorings and tenderizers. Bouillon cubes. Hot sauce. Tabasco sauce. Marinades. Taco seasonings. Relishes. Fats and Oils Butter, stick margarine, lard, shortening, ghee, and bacon fat. Coconut, palm kernel, or palm oils. Regular salad dressings. Other Pickles and olives. Salted popcorn and pretzels. The items listed above may not be a complete list of foods and beverages to avoid. Contact your dietitian for more information. WHERE CAN I FIND MORE INFORMATION? National Heart, Lung, and [employer] Institute: travelstabloid.com Document Released: 08/08/2011 Document Revised: 01/03/2014 Document Reviewed: 06/23/2013 [employer] Patient Information 2015  [employer]. This information is not intended to replace advice given to you by your health care provider. Make sure you discuss any questions you have with your health care provider.  Dizziness Dizziness is a common problem. It is a feeling of unsteadiness or light-headedness. You may feel like you are about to faint. Dizziness can lead to injury if you stumble or fall. A person of any age group can suffer from dizziness, but dizziness is more common in older adults. CAUSES  Dizziness can be caused by many different things, including:  Middle ear problems.  Standing for too long.  Infections.  An allergic reaction.  Aging.  An emotional response to something, such as the sight of  blood.  Side effects of medicines.  Tiredness.  Problems with circulation or blood pressure.  Excessive use of alcohol or medicines, or illegal drug use.  Breathing too fast (hyperventilation).  An irregular heart rhythm (arrhythmia).  A low red blood cell count (anemia).  Pregnancy.  Vomiting, diarrhea, fever, or other illnesses that cause body fluid loss (dehydration).  Diseases or conditions such as Parkinson's disease, high blood pressure (hypertension), diabetes, and thyroid problems.  Exposure to extreme heat. DIAGNOSIS  Your health care provider will ask about your symptoms, perform a physical exam, and perform an electrocardiogram (ECG) to record the electrical activity of your heart. Your health care provider may also perform other heart or blood tests to determine the cause of your dizziness. These may include:  Transthoracic echocardiogram (TTE). During echocardiography, sound waves are used to evaluate how blood flows through your heart.  Transesophageal echocardiogram (TEE).  Cardiac monitoring. This allows your health care provider to monitor your heart rate and rhythm in real time.  Holter monitor. This is a portable device that records your heartbeat and can help diagnose heart arrhythmias. It allows your health care provider to track your heart activity for several days if needed.  Stress tests by exercise or by giving medicine that makes the heart beat faster. TREATMENT  Treatment of dizziness depends on the cause of your symptoms and can vary greatly. HOME CARE INSTRUCTIONS   Drink enough fluids to keep your urine clear or pale yellow. This is especially important in very hot weather. In older adults, it is also important in cold weather.  Take your medicine exactly as directed if your dizziness is caused by medicines. When taking blood pressure medicines, it is especially important to get up slowly.  Rise slowly from chairs and steady yourself until you  feel okay.  In the morning, first sit up on the side of the bed. When you feel okay, stand slowly while holding onto something until you know your balance is fine.  Move your legs often if you need to stand in one place for a long time. Tighten and relax your muscles in your legs while standing.  Have someone stay with you for 1-2 days if dizziness continues to be a problem. Do this until you feel you are well enough to stay alone. Have the person call your health care provider if he or she notices changes in you that are concerning.  Do not drive or use heavy machinery if you feel dizzy.  Do not drink alcohol. SEEK IMMEDIATE MEDICAL CARE IF:   Your dizziness or light-headedness gets worse.  You feel nauseous or vomit.  You have problems talking, walking, or using your arms, hands, or legs.  You feel weak.  You are not thinking clearly or you have  trouble forming sentences. It may take a friend or family member to notice this.  You have chest pain, abdominal pain, shortness of breath, or sweating.  Your vision changes.  You notice any bleeding.  You have side effects from medicine that seems to be getting worse rather than better. MAKE SURE YOU:   Understand these instructions.  Will watch your condition.  Will get help right away if you are not doing well or get worse. Document Released: 02/12/2001 Document Revised: 08/24/2013 Document Reviewed: 03/08/2011 [facility] Patient Information 2015 [place], [place]. This information is not intended to replace advice given to you by your health care provider. Make sure you discuss any questions you have with your health care provider.  Exercise to Lose Weight Exercise and a healthy diet may help you lose weight. Your doctor may suggest specific exercises. EXERCISE IDEAS AND TIPS  Choose low-cost things you enjoy doing, such as walking, bicycling, or exercising to workout videos.  Take stairs instead of the elevator.  Walk during  your lunch break.  Park your car further away from work or school.  Go to a gym or an exercise class.  Start with 5 to 10 minutes of exercise each day. Build up to 30 minutes of exercise 4 to 6 days a week.  Wear shoes with good support and comfortable clothes.  Stretch before and after working out.  Work out until you breathe harder and your heart beats faster.  Drink extra water when you exercise.  Do not do so much that you hurt yourself, feel dizzy, or get very short of breath. Exercises that burn about 150 calories:  Running 1  miles in 15 minutes.  Playing volleyball for 45 to 60 minutes.  Washing and waxing a car for 45 to 60 minutes.  Playing touch football for 45 minutes.  Walking 1  miles in 35 minutes.  Pushing a stroller 1  miles in 30 minutes.  Playing basketball for 30 minutes.  Raking leaves for 30 minutes.  Bicycling 5 miles in 30 minutes.  Walking 2 miles in 30 minutes.  Dancing for 30 minutes.  Shoveling snow for 15 minutes.  Swimming laps for 20 minutes.  Walking up stairs for 15 minutes.  Bicycling 4 miles in 15 minutes.  Gardening for 30 to 45 minutes.  Jumping rope for 15 minutes.  Washing windows or floors for 45 to 60 minutes. Document Released: 09/21/2010 Document Revised: 11/11/2011 Document Reviewed: 09/21/2010 [facility] Patient Information 2015 [place], [place]. This information is not intended to replace advice given to you by your health care provider. Make sure you discuss any questions you have with your health care provider.

## 2014-04-13 NOTE — Progress Notes (Signed)
Patient IV removed and discharge papers, given explained and signed copy in file.  Patient taken to main entrance by wheelchair.  Patient taken home by cab.

## 2014-04-13 NOTE — Progress Notes (Signed)
UR Completed Jodilyn Giese Graves-Bigelow, RN,BSN [phone]  

## 2014-04-26 ENCOUNTER — Ambulatory Visit (INDEPENDENT_AMBULATORY_CARE_PROVIDER_SITE_OTHER): Payer: Medicare Other | Admitting: *Deleted

## 2014-04-26 DIAGNOSIS — Z5181 Encounter for therapeutic drug level monitoring: Secondary | ICD-10-CM

## 2014-04-26 DIAGNOSIS — I2699 Other pulmonary embolism without acute cor pulmonale: Secondary | ICD-10-CM

## 2014-04-26 LAB — POCT INR: INR: 1.6

## 2014-05-10 ENCOUNTER — Ambulatory Visit (INDEPENDENT_AMBULATORY_CARE_PROVIDER_SITE_OTHER): Payer: Medicare Other | Admitting: Internal Medicine

## 2014-05-10 ENCOUNTER — Encounter: Payer: Self-pay | Admitting: Internal Medicine

## 2014-05-10 VITALS — BP 140/84 | HR 63 | Temp 98.4°F | Ht 71.0 in | Wt 262.0 lb

## 2014-05-10 DIAGNOSIS — I359 Nonrheumatic aortic valve disorder, unspecified: Secondary | ICD-10-CM

## 2014-05-10 DIAGNOSIS — I350 Nonrheumatic aortic (valve) stenosis: Secondary | ICD-10-CM

## 2014-05-10 DIAGNOSIS — E669 Obesity, unspecified: Secondary | ICD-10-CM

## 2014-05-10 DIAGNOSIS — I10 Essential (primary) hypertension: Secondary | ICD-10-CM

## 2014-05-10 DIAGNOSIS — I2510 Atherosclerotic heart disease of native coronary artery without angina pectoris: Secondary | ICD-10-CM

## 2014-05-10 DIAGNOSIS — R42 Dizziness and giddiness: Secondary | ICD-10-CM

## 2014-05-10 NOTE — Assessment & Plan Note (Signed)
Mobidly obese, especially with his heart and valve condition with high likelihood of needing surgery. Intentional weight loss efforts since early summer 2015- Start 294# 12/2013 Trends reviewed Target weight - 220 lbs  The patient is encouraged to make ongoing efforts to improve diet and exercise patterns to aid in medical management of this problem.

## 2014-05-10 NOTE — Progress Notes (Signed)
Subjective:    Patient ID: Chris Reyes, male    DOB: 1951-01-03, 63 y.o.   MRN: [id number]  HPI  Patient here for follow up - ED overnight last mo due to dizziness - felt to be symptomatic AS with neg CNS eval, but pt feels maybe related to dehydration with yard work prior to that event  Past Medical History  Diagnosis Date  . Hypertension   . Hernia   . COPD (chronic obstructive pulmonary disease)   . History of blood clots 2007    in lungs  . CAD (coronary artery disease)     Catheterization, [place], July, 2006, EF 45%,  50% LAD, 50% RCA, 90% marginal, possible collaterals from LAD  to a branch of the PDA, medical therapy recommended  . GI bleed     2006, no significant abnormalities per the patient  . PAD (peripheral artery disease)     Arterial leg Dopplers, April, 2014, normal  . Ejection fraction     EF 45%, catheterization, 2006  //   EF 55%, echo, March, 2012, wall abnormality at the base of the inferior wall  . Aortic stenosis     severe echo 01/2013- cannot rule out bicuspid aortic valve  //   moderately severe, echo,  March, 2012 ( progression since 2009)  . RBBB   . Pulmonary emboli     Significant, 2007, Coumadin therapy  . Warfarin anticoagulation     History pulmonary emboli  . Bradycardia   . Tobacco abuse   . Carotid artery disease     Doppler, March, 2012,  0-39% bilateral  . Dyslipidemia     Review of Systems  Constitutional: Negative for fever, fatigue and unexpected weight change (intentional loss since 12/2013 (start 294#)).  Respiratory: Negative for cough and shortness of breath.   Neurological: Negative for dizziness (none since 04/2014 hosp for same, ?dehydrated at that time per pt), tremors, syncope, weakness and light-headedness.       Objective:   Physical Exam  BP 140/84  Pulse 63  Temp(Src) 98.4 F (36.9 C) (Oral)  Ht 5\' 11"  (1.803 m)  Wt 262 lb (118.842 kg)  BMI 36.56 kg/m2  SpO2 95% Wt Readings from Last 3 Encounters:    05/10/14 262 lb (118.842 kg)  04/12/14 264 lb 15.9 oz (120.2 kg)  02/01/14 287 lb 3.2 oz (130.273 kg)   Constitutional: he is MO, and appears well-developed and well-nourished. No distress.  Neck: Normal range of motion. Neck supple. No JVD present. No thyromegaly present.  Cardiovascular: Normal rate, regular rhythm and normal heart sounds.  3/6 holosyst murmur heard. No BLE edema. Pulmonary/Chest: Effort normal and breath sounds normal. No respiratory distress. he has no wheezes.  Psychiatric: he has a normal mood and affect. His behavior is normal. Judgment and thought content normal.   Lab Results  Component Value Date   WBC 6.6 04/12/2014   HGB 17.4* 04/12/2014   HCT 49.4 04/12/2014   PLT 158 04/12/2014   GLUCOSE 116* 04/13/2014   CHOL 163 04/12/2014   TRIG 163* 04/12/2014   HDL 26* 04/12/2014   LDLDIRECT 138.0 01/26/2009   LDLCALC 104* 04/12/2014   ALT 38 04/12/2014   AST 34 04/12/2014   NA 137 04/13/2014   K 4.4 04/13/2014   CL 102 04/13/2014   CREATININE 0.97 04/13/2014   BUN 13 04/13/2014   CO2 25 04/13/2014   TSH 1.80 12/21/2013   PSA 4.70* 11/06/2010   INR 1.6 04/26/2014  HGBA1C 5.8* 04/12/2014    Ct Head Wo Contrast  04/12/2014   CLINICAL DATA:  Dizziness.  EXAM: CT HEAD WITHOUT CONTRAST  TECHNIQUE: Contiguous axial images were obtained from the base of the skull through the vertex without intravenous contrast.  COMPARISON:  None.  FINDINGS: There is no evidence of acute infarction, mass lesion, or intra- or extra-axial hemorrhage on CT.  Prominence of the ventricles and sulci reflects mild cortical volume loss. Mild periventricular white matter change likely reflects small vessel ischemic microangiopathy.  The brainstem and fourth ventricle are within normal limits. The basal ganglia are unremarkable in appearance. The cerebral hemispheres demonstrate grossly normal gray-white differentiation. No mass effect or midline shift is seen.  There is no evidence of fracture; visualized osseous  structures are unremarkable in appearance. The orbits are within normal limits. A mucus retention cyst or polyp is noted within the right maxillary sinus. The remaining paranasal sinuses and mastoid air cells are well-aerated. No significant soft tissue abnormalities are seen.  IMPRESSION: 1. No acute intracranial pathology seen on CT. 2. Mild cortical volume loss and scattered small vessel ischemic microangiopathy. 3. Mucus retention cyst or polyp within the right maxillary sinus.   Electronically Signed   By: Garald Balding M.D.   On: 04/12/2014 01:33   Mri Brain Without Contrast  04/12/2014   CLINICAL DATA:  63 year old male with chief complaint of dizziness. History of peripheral vascular disease, hypertension, with progression of recent/chronic dizziness. Symptoms increased with head movements. Initial encounter.  EXAM: MRI HEAD WITHOUT CONTRAST  TECHNIQUE: Multiplanar, multiecho pulse sequences of the brain and surrounding structures were obtained without intravenous contrast.  COMPARISON:  Head CT without contrast 0113 hr the same day.  FINDINGS: Cerebral volume is within normal limits for age. Major intracranial vascular flow voids are preserved. No restricted diffusion to suggest acute infarction. No midline shift, mass effect, evidence of mass lesion, ventriculomegaly, extra-axial collection or acute intracranial hemorrhage. Cervicomedullary junction and pituitary are within normal limits. Negative visualized cervical spine. Visible internal auditory structures appear normal.  Scattered mild for age mostly small foci of nonspecific cerebral white matter T2 and FLAIR hyperintensity (e.g. Series 6, image 18). Some superimposed dilated perivascular spaces. No cortical encephalomalacia identified. Deep gray matter nuclei, and cerebellum are within normal limits. Minimal to mild for age nonspecific T2 hyperintensity in the central pons.  Normal non contrast appearance of the cavernous sinus. Negative  mastoids and paranasal sinuses except for mild mucosal thickening in the right maxillary alveolar recess. Visualized orbit soft tissues are within normal limits. Visualized scalp soft tissues are within normal limits. Visualized bone marrow signal is within normal limits.  IMPRESSION: 1.  No acute intracranial abnormality. 2. Mild for age nonspecific signal changes in the brain, most commonly due to chronic small vessel disease.   Electronically Signed   By: Lars Pinks M.D.   On: 04/12/2014 11:27   Dg Chest Port 1 View  04/12/2014   CLINICAL DATA:  Dry cough, chills and dizziness. History of smoking.  EXAM: PORTABLE CHEST - 1 VIEW  COMPARISON:  Chest radiograph performed 08/30/2013  FINDINGS: The lungs are well-aerated. Mild bibasilar opacities appear grossly stable and likely reflect chronic lung changes. No definite superimposed focal airspace consolidation is seen. No pleural effusion or pneumothorax is identified, though the costophrenic angles are incompletely imaged on this study.  The cardiomediastinal silhouette is within normal limits. No acute osseous abnormalities are seen.  IMPRESSION: Mild bibasilar airspace opacities appear grossly stable and likely  reflect chronic lung changes. No superimposed focal airspace consolidation seen.   Electronically Signed   By: Garald Balding M.D.   On: 04/12/2014 01:17       Assessment & Plan:   Problem List Items Addressed This Visit   Aortic stenosis - Primary     Severe AS echo 01/2013 - progressive since 2013, but stable 12/2013 ?symptomatic dizziness with exertion related to same Considering surgical intervention after cards follow up 01/2014 hosp overnight for dizzy event 04/2014 - not CNS based on workup -  ?dehydration vs AS - refer to CTS for consideration of valve surg (pt now ready to consider, prev declined as not convinced he was symptomatic) Reviewed interval changes in depth today    Relevant Orders      Ambulatory referral to Cardiothoracic  Surgery      Ambulatory referral to Cardiology   Dizziness   Relevant Orders      Ambulatory referral to Cardiothoracic Surgery      Ambulatory referral to Cardiology   HYPERTENSION      BP Readings from Last 3 Encounters:  05/10/14 140/84  04/13/14 134/79  02/01/14 130/82   stopped amlodipine 12/2013 because of "soft" BP Improved but still feels "light headed" on occassion stopped hctz 01/2014 due to concern for dehydration continue ARB and imdur given CAD    Relevant Orders      Ambulatory referral to Cardiothoracic Surgery      Ambulatory referral to Cardiology   Obesity, Class II, BMI 35-39.9     Mobidly obese, especially with his heart and valve condition with high likelihood of needing surgery. Intentional weight loss efforts since early summer 2015- Start 294# 12/2013 Trends reviewed Target weight - 220 lbs  The patient is encouraged to make ongoing efforts to improve diet and exercise patterns to aid in medical management of this problem.

## 2014-05-10 NOTE — Assessment & Plan Note (Signed)
BP Readings from Last 3 Encounters:  05/10/14 140/84  04/13/14 134/79  02/01/14 130/82   stopped amlodipine 12/2013 because of "soft" BP Improved but still feels "light headed" on occassion stopped hctz 01/2014 due to concern for dehydration continue ARB and imdur given CAD

## 2014-05-10 NOTE — Assessment & Plan Note (Signed)
Severe AS echo 01/2013 - progressive since 2013, but stable 12/2013 ?symptomatic dizziness with exertion related to same Considering surgical intervention after cards follow up 01/2014 hosp overnight for dizzy event 04/2014 - not CNS based on workup -  ?dehydration vs AS - refer to CTS for consideration of valve surg (pt now ready to consider, prev declined as not convinced he was symptomatic) Reviewed interval changes in depth today

## 2014-05-10 NOTE — Patient Instructions (Signed)
It was good to see you today.  We have reviewed your prior records including labs and tests today  Medications reviewed and updated, no changes recommended at this time. Refill on medication(s) as discussed today.  we'll make referral to cardiothoracic surgery for consideration of aortic valve surgery as needed. Our office will contact you regarding appointment(s) once made.  Please schedule followup in 4 months, call sooner if problems.

## 2014-05-10 NOTE — Progress Notes (Signed)
Pre visit review using our clinic review tool, if applicable. No additional management support is needed unless otherwise documented below in the visit note. 

## 2014-05-24 ENCOUNTER — Ambulatory Visit (INDEPENDENT_AMBULATORY_CARE_PROVIDER_SITE_OTHER): Payer: Medicare Other | Admitting: *Deleted

## 2014-05-24 DIAGNOSIS — I2699 Other pulmonary embolism without acute cor pulmonale: Secondary | ICD-10-CM

## 2014-05-24 DIAGNOSIS — Z5181 Encounter for therapeutic drug level monitoring: Secondary | ICD-10-CM

## 2014-05-24 LAB — POCT INR: INR: 2.4

## 2014-05-25 ENCOUNTER — Encounter: Payer: Medicare Other | Admitting: Cardiothoracic Surgery

## 2014-06-06 ENCOUNTER — Encounter: Payer: Self-pay | Admitting: Cardiothoracic Surgery

## 2014-06-06 ENCOUNTER — Encounter: Payer: Medicare Other | Admitting: Cardiothoracic Surgery

## 2014-06-06 ENCOUNTER — Institutional Professional Consult (permissible substitution) (INDEPENDENT_AMBULATORY_CARE_PROVIDER_SITE_OTHER): Payer: Medicare Other | Admitting: Cardiothoracic Surgery

## 2014-06-06 VITALS — BP 118/74 | HR 80 | Resp 16 | Ht 71.0 in | Wt 250.0 lb

## 2014-06-06 DIAGNOSIS — I2510 Atherosclerotic heart disease of native coronary artery without angina pectoris: Secondary | ICD-10-CM

## 2014-06-06 DIAGNOSIS — I350 Nonrheumatic aortic (valve) stenosis: Secondary | ICD-10-CM

## 2014-06-06 NOTE — Progress Notes (Signed)
Patient ID: Chris Reyes, male   DOB: 03/07/1951, 63 y.o.   MRN: [id number]      [address]       [place] [address]             [phone]        [facility] Lem[facility] Medical Record #[id number] Date of Birth: 08/08/1951  Referring: Rowe Clack, MD Primary Care: Gwendolyn Grant, MD  Chief Complaint:    Chief Complaint  Patient presents with  . Aortic Stenosis    eval for surgery .Marland KitchenMarland Kitchenreferred by his PCP, Dr. Gwendolyn Grant, cardiologist is Dr. Dola Argyle, last ECHO 01/03/14 and had f/u with Dr. Ron Parker   patient examined, echocardiograms and chest x-ray reviewed  History of Present Illness:     63 year old Caucasian male nondiabetic smoker followed by cardiology here since 2007 for aortic stenosis. The patient's annual 2-D echo cardio gram has shown progressive increase in transvalvular gradient. The patient's last echocardiogram 6 months ago demonstrated severe aortic stenosis with a peak gradient of 68 mmHg, index valve area of 0.5, LVH with good systolic function and mild-moderate aortic insufficiency. The patient's cardiologist, Dr. Ron Parker, recommended aortic valve replacement with patient was not willing to proceed with surgery because his symptoms were minimal. The patient has had a heart murmur since 1997. The patient's mother had a aortic valve replacement in her mid 64s. The patient is felt that a probable bicuspid aortic valve.  Last month the patient was admitted to the hospital with sudden episode of dizziness, presyncope. This was mainly orthostatic in position. Brain scan was negative for stroke. The patient was observed for 48 hours and then released. He's had no recurrent episodes of presyncope or dizziness. His primary care physician has referred him for discussion of aortic valve replacement.  The patient has a history of DVT and possible hypercoagulable state. He is been on oral Coumadin since 2007 when he developed bilateral pulmonary  emboli. He has had no bleeding complications from Coumadin over the past 10 years.  The patient is a fairly sedentary lifestyle but does do some yard work. He takes care of a household and does all of the shopping as his wife has severe panic attacks and agoraphobia.  The patient's CTA of the chest to diagnose pulmonary emboli demonstrated his ascending aorta to be 4 cm  Patient currently smokes one half pack per day and has smoked for over 20 years. He has complete dentures and no active dental complaints.  The patient had a cardiac catheterization in the 90s when he lived in [place] and was told he had no significant CAD.  Current Activity/ Functional Status: Patient is married lives with his wife. 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 [employer]   Zubrod Score: At the time of surgery this patient's most appropriate activity status/level should be described as: []     0    Normal activity, no symptoms [x]     1    Restricted in physical strenuous activity but ambulatory, able to do out light work []     2    Ambulatory and capable of self care, unable to do work activities, up and about                 more than 50%  Of the time                            []   3    Only limited self care, in bed greater than 50% of waking hours []     4    Completely disabled, no self care, confined to bed or chair []     5    Moribund  Past Medical History  Diagnosis Date  . Hypertension   . Hernia   . COPD (chronic obstructive pulmonary disease)   . History of blood clots 2007    in lungs  . CAD (coronary artery disease)     Catheterization, [place], July, 2006, EF 45%,  50% LAD, 50% RCA, 90% marginal, possible collaterals from LAD  to a branch of the PDA, medical therapy recommended  . GI bleed     2006, no significant abnormalities per the patient  . PAD (peripheral artery disease)     Arterial leg Dopplers, April, 2014, normal  . Ejection fraction     EF 45%,  catheterization, 2006  //   EF 55%, echo, March, 2012, wall abnormality at the base of the inferior wall  . Aortic stenosis     severe echo 01/2013- cannot rule out bicuspid aortic valve  //   moderately severe, echo,  March, 2012 ( progression since 2009)  . RBBB   . Pulmonary emboli     Significant, 2007, Coumadin therapy  . Warfarin anticoagulation     History pulmonary emboli  . Bradycardia   . Tobacco abuse   . Carotid artery disease     Doppler, March, 2012,  0-39% bilateral  . Dyslipidemia     Past Surgical History  Procedure Laterality Date  . Back surgery  1988, 1990, 2006    2 surgeries. disc fusion, pins installed. (L-5-S1)  . Cholecystectomy  1998  . Nerve rotation      left arm  . Tonsillectomy    . Gallbladder surgery      History  Smoking status  . Current Every Day Smoker -- 0.50 packs/day for 20 years  . Types: Cigarettes  Smokeless tobacco  . Never Used    Comment: 5-10 cigarettes per day   History  Alcohol Use  . Yes    Comment: 1 drink a month    History   Social History  . Marital Status: Divorced    Spouse Name: N/A    Number of Children: N/A  . Years of Education: N/A   Occupational History  . Not on file.   Social History Main Topics  . Smoking status: Current Every Day Smoker -- 0.50 packs/day for 20 years    Types: Cigarettes  . Smokeless tobacco: Never Used     Comment: 5-10 cigarettes per day  . Alcohol Use: Yes     Comment: 1 drink a month  . Drug Use: No  . Sexual Activity: Not on file   Other Topics Concern  . 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    No Known Allergies  Current Outpatient Prescriptions  Medication Sig Dispense Refill  . acetaminophen (TYLENOL) 500 MG tablet Take 1,500 mg  by mouth every 6 (six) hours as needed (pain).      Marland Kitchen aspirin EC 81 MG EC tablet Take 1 tablet (81 mg total) by mouth daily.      Marland Kitchen  atorvastatin (LIPITOR) 80 MG tablet Take 1 tablet (80 mg total) by mouth daily.  30 tablet  11  . Coenzyme Q10 (COQ10) 100 MG CAPS Take 1 capsule by mouth.      . fish oil-omega-3 fatty acids 1000 MG capsule Take 1 g by mouth daily.      . fluticasone (FLONASE) 50 MCG/ACT nasal spray Place 1 spray into both nostrils daily.  16 g  2  . ibuprofen (ADVIL,MOTRIN) 200 MG tablet Take 600 mg by mouth every 6 (six) hours as needed (pain).      . isosorbide mononitrate (IMDUR) 30 MG 24 hr tablet Take 30 mg by mouth daily.      Marland Kitchen losartan (COZAAR) 50 MG tablet Take 1 tablet (50 mg total) by mouth daily.  90 tablet  3  . warfarin (COUMADIN) 5 MG tablet Take 2.5-5 mg by mouth daily. Takes 5mg  on Tues and Thursdays and half a tab 2.5 mg Mon. Wed, Fri, Sat, Sun      . [DISCONTINUED] LISINOPRIL PO Take by mouth daily.         No current facility-administered medications for this visit.     (Not in a hospital admission)  Family History  Problem Relation Age of Onset  . Diabetes Father   . Cancer Father     large cell lung cancer  . Heart disease Mother      Review of Systems:   Patient has had a previous cholecystectomy and umbilical hernia repair without anesthesia or bleeding complications.  The patient has chronic low back pain has received lumbar laminectomy with fusion.  Patient has history of obesity with BMI greater than 33. He has been on a purposeful diet.and has lost 40 pounds   Cardiac Review of Systems: Y or N  Chest Pain [ no   ]  Resting SOB [ no  ] Exertional SOB  [ no ]  Orthopnea [ no ]   Pedal Edema [  no ]    Palpitations [no  ] Syncope  [no  ]   Presyncope [place]   ]  General Review of Systems: [Y] = yes [  ]=no Constitional: recent weight change [  ]; anorexia [  ]; fatigue [  ]; nausea [  ]; night sweats [  ]; fever [  ]; or chills [  ]                                                                Dental: poor dentition[  ]; Last Dentist visit: 3 months ago  Eye : blurred vision [  ]; diplopia [   ]; vision changes [  ];  Amaurosis fugax[  ]; Resp: cough [  ];  wheezing[  ];  hemoptysis[  ]; shortness of breath[  ]; paroxysmal nocturnal dyspnea[  ]; dyspnea on exertion[  ]; or orthopnea[  ];  GI:  gallstones[  ], vomiting[  ];  dysphagia[  ]; melena[  ];  hematochezia [  ]; heartburn[  ];   Hx of  Colonoscopy[  ]; GU: kidney stones [  ]; hematuria[  ];   dysuria [  ];  nocturia[  ];  history of     obstruction [  ]; urinary frequency [  ]  Skin: rash, swelling[  ];, hair loss[  ];  peripheral edema[  ];  or itching[  ]; Musculosketetal: myalgias[  ];  joint swelling[  ];  joint erythema[  ];  joint pain[  ];  back pain[  ];  Heme/Lymph: bruising[  ];  bleeding[  ];  anemia[  ];  Neuro: TIA[  ];  headaches[  ];  stroke[  ];  vertigo[  ];  seizures[  ];   paresthesias[  ];  difficulty walking[  ];  Psych:depression[  ]; anxiety[  ];  Endocrine: diabetes[  ];  thyroid dysfunction[  ];  Immunizations: Flu [  ]; Pneumococcal[  ];  Other:  Physical Exam: BP 118/74  Pulse 80  Resp 16  Ht 5\' 11"  (1.803 m)  Wt 250 lb (113.399 kg)  BMI 34.88 kg/m2  SpO2 97%  Gen. appearance-middle-aged obese male no acute distress HEENT-normocephalic, pupils equal, dentition with full upper and lower dental plates Neck-no JVD bruit or palpable adenopathy Thorax-nontender no deformity breath sounds clear, good mechanics of breathing by the bedside testing Cardiac-regular rhythm, grade 3/6 systolic ejection murmur, grade 1-4/2 diastolic rumble, no gallop Abdomen-soft nontender without pulsatile mass Extremities-venous stasis dermatitis of both lower extremities from the mid tibial region distally with varicosities right greater than left Vascular-palpable pulses in the extremities, Neuro-no focal motor deficit  Diagnostic Studies &  Laboratory data:     Recent Radiology Findings:   Chest x-ray personally reviewed showing COPD-emphysema    Recent Lab Findings: Lab Results  Component Value Date   WBC 6.6 04/12/2014   HGB 17.4* 04/12/2014   HCT 49.4 04/12/2014   PLT 158 04/12/2014   GLUCOSE 116* 04/13/2014   CHOL 163 04/12/2014   TRIG 163* 04/12/2014   HDL 26* 04/12/2014   LDLDIRECT 138.0 01/26/2009   LDLCALC 104* 04/12/2014   ALT 38 04/12/2014   AST 34 04/12/2014   NA 137 04/13/2014   K 4.4 04/13/2014   CL 102 04/13/2014   CREATININE 0.97 04/13/2014   BUN 13 04/13/2014   CO2 25 04/13/2014   TSH 1.80 12/21/2013   INR 2.4 05/24/2014   HGBA1C 5.8* 04/12/2014      Assessment / Plan:     Severe aortic stenosis-probable bicuspid aortic valve   Hypertension   Bilateral chronic venous insufficiency on chronic coumadin   History bilateral pulmonary emboli 2007   Chronic low back pain    Morbid obesity    COPD, active smoking     Morbid obesity, BMI 35.0   The patient would benefit from aortic valve replacement, probably with a mechanical valve due to his long-term commitment to Coumadin therapy for pulmonary emboli-hypercoagulable state  Prior to aVR the patient will  need a left and right heart cath and he will need to stop smoking.  Patient will  return after he sees his cardiologist, Dr. Ron Parker in early November to review his cardiac cath, review his smoking cessation status, and to schedule surgery.    @ME1 @ 06/06/2014 2:27 PM

## 2014-06-08 ENCOUNTER — Encounter: Payer: Medicare Other | Admitting: Cardiothoracic Surgery

## 2014-06-15 ENCOUNTER — Ambulatory Visit (INDEPENDENT_AMBULATORY_CARE_PROVIDER_SITE_OTHER): Payer: Medicare Other | Admitting: Cardiology

## 2014-06-15 ENCOUNTER — Encounter: Payer: Self-pay | Admitting: Cardiology

## 2014-06-15 VITALS — BP 138/76 | HR 75 | Ht 71.0 in | Wt 252.8 lb

## 2014-06-15 DIAGNOSIS — I2510 Atherosclerotic heart disease of native coronary artery without angina pectoris: Secondary | ICD-10-CM

## 2014-06-15 DIAGNOSIS — Z720 Tobacco use: Secondary | ICD-10-CM

## 2014-06-15 DIAGNOSIS — I350 Nonrheumatic aortic (valve) stenosis: Secondary | ICD-10-CM

## 2014-06-15 DIAGNOSIS — F17200 Nicotine dependence, unspecified, uncomplicated: Secondary | ICD-10-CM

## 2014-06-15 NOTE — Assessment & Plan Note (Signed)
Unfortunately he is still smoking at this time. He needs to stop before his surgery.

## 2014-06-15 NOTE — Progress Notes (Signed)
Patient ID: Chris Reyes, male   DOB: 1950-09-15, 63 y.o.   MRN: [id number]    HPI  The patient is seen today to followup coronary disease and severe aortic stenosis. When I saw him in May, 2015 I continued to encourage him to strongly consider aortic valve replacement. Since that time he has seen Dr. Prescott Gum for surgical consultation. The patient has decided to proceed with surgery. He tells me today that we'll not be before January of 2016. I had him come into the office today thinking that the surgery might be sooner, so that I could arrange for cardiac catheterization. However, I suspect that it would be more appropriate for the cath to be closer to the time of his surgery. He is stable at this point. He says that he's lost 40 pounds and hopes to continue to lose weight. He mentions that his wife is very anxious about any procedures that he has.  No Known Allergies  Current Outpatient Prescriptions  Medication Sig Dispense Refill  . acetaminophen (TYLENOL) 500 MG tablet Take 1,500 mg by mouth every 6 (six) hours as needed (pain).      Marland Kitchen aspirin EC 81 MG EC tablet Take 1 tablet (81 mg total) by mouth daily.      Marland Kitchen atorvastatin (LIPITOR) 80 MG tablet Take 1 tablet (80 mg total) by mouth daily.  30 tablet  11  . Coenzyme Q10 (COQ10) 100 MG CAPS Take 1 capsule by mouth daily.       . fish oil-omega-3 fatty acids 1000 MG capsule Take 1 g by mouth daily.      . fluticasone (FLONASE) 50 MCG/ACT nasal spray Place 1 spray into both nostrils daily.  16 g  2  . ibuprofen (ADVIL,MOTRIN) 200 MG tablet Take 600 mg by mouth every 6 (six) hours as needed (pain).      . isosorbide mononitrate (IMDUR) 30 MG 24 hr tablet Take 30 mg by mouth daily.      Marland Kitchen losartan (COZAAR) 50 MG tablet Take 1 tablet (50 mg total) by mouth daily.  90 tablet  3  . warfarin (COUMADIN) 5 MG tablet Take 2.5-5 mg by mouth daily. Takes 5mg  on Tues and Thursdays and half a tab 2.5 mg Mon. Wed, Fri, Sat, Sun      . [DISCONTINUED]  LISINOPRIL PO Take by mouth daily.         No current facility-administered medications for this visit.    History   Social History  . Marital Status: Divorced    Spouse Name: N/A    Number of Children: N/A  . Years of Education: N/A   Occupational History  . Not on file.   Social History Main Topics  . Smoking status: Current Every Day Smoker -- 0.50 packs/day for 20 years    Types: Cigarettes  . Smokeless tobacco: Never Used     Comment: 5-10 cigarettes per day  . Alcohol Use: Yes     Comment: 1 drink a month  . Drug Use: No  . Sexual Activity: Not on file   Other Topics Concern  . 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    Family History  Problem Relation Age  of Onset  . Diabetes Father   . Cancer Father     large cell lung cancer  . Heart disease Mother     Past Medical History  Diagnosis Date  . Hypertension   . Hernia   . COPD (chronic obstructive pulmonary disease)   . History of blood clots 2007    in lungs  . CAD (coronary artery disease)     Catheterization, [place], July, 2006, EF 45%,  50% LAD, 50% RCA, 90% marginal, possible collaterals from LAD  to a branch of the PDA, medical therapy recommended  . GI bleed     2006, no significant abnormalities per the patient  . PAD (peripheral artery disease)     Arterial leg Dopplers, April, 2014, normal  . Ejection fraction     EF 45%, catheterization, 2006  //   EF 55%, echo, March, 2012, wall abnormality at the base of the inferior wall  . Aortic stenosis     severe echo 01/2013- cannot rule out bicuspid aortic valve  //   moderately severe, echo,  March, 2012 ( progression since 2009)  . RBBB   . Pulmonary emboli     Significant, 2007, Coumadin therapy  . Warfarin  anticoagulation     History pulmonary emboli  . Bradycardia   . Tobacco abuse   . Carotid artery disease     Doppler, March, 2012,  0-39% bilateral  . Dyslipidemia     Past Surgical History  Procedure Laterality Date  . Back surgery  1988, 1990, 2006    2 surgeries. disc fusion, pins installed. (L-5-S1)  . Cholecystectomy  1998  . Nerve rotation      left arm  . Tonsillectomy    . Gallbladder surgery      Patient Active Problem List   Diagnosis Date Noted  . Dizziness 04/12/2014  . Peripheral neuropathy 02/01/2014  . Obesity, Class II, BMI 35-39.9 02/02/2013  . Healthcare maintenance 02/02/2013  . PAD (peripheral artery disease)   . CAD (coronary artery disease)   . GI bleed   . Ejection fraction   . Aortic stenosis   . Pulmonary emboli   . Warfarin anticoagulation   . Carotid artery disease   . RBBB   . HERNIA, UMBILICAL 00/86/7619  . TOBACCO ABUSE 09/25/2010  . HYPERTENSION 09/24/2010  . COPD 09/24/2010  . BRADYCARDIA 01/26/2009  . HYPERLIPIDEMIA 06/20/2007  . CARPAL TUNNEL SYNDROME 06/20/2007    ROS   Patient denies fever, chills, headache, sweats, rash, change in vision, change in hearing, chest pain, cough, nausea vomiting, urinary symptoms. All other systems are reviewed and are negative.  PHYSICAL EXAM  Patient is oriented to person time and place. Affect is normal. Lungs reveal scattered rhonchi. There is no respiratory distress. Cardiac exam reveals S1 and S2. He has a murmur of aortic stenosis. The abdomen is soft. There is no significant peripheral edema.  Filed Vitals:   06/15/14 1512  BP: 138/76  Pulse: 75  Height: 5\' 11"  (1.803 m)  Weight: 252 lb 12.8 oz (114.669 kg)  SpO2: 97%     ASSESSMENT & PLAN

## 2014-06-15 NOTE — Patient Instructions (Signed)
**Note De-Identified  Obfuscation** Your physician recommends that you continue on your current medications as directed. Please refer to the Current Medication list given to you today.  Your physician recommends that you schedule a follow-up appointment in: mid December

## 2014-06-15 NOTE — Assessment & Plan Note (Signed)
The patient is now planning to have aortic valve surgery. The timing is still pending. He says that we'll not be before January of 2016. With this timing, I will be seeing him back in December to arrange for catheterization. He will then followup with Dr. Prescott Gum to finalize the timing of his surgery. The patient is losing weight.

## 2014-06-15 NOTE — Assessment & Plan Note (Signed)
Patient has known coronary disease. His anatomy will be reassessed catheterization before he goes on to aortic valve replacement.

## 2014-06-27 ENCOUNTER — Ambulatory Visit: Payer: Medicare Other | Admitting: Cardiology

## 2014-07-06 ENCOUNTER — Ambulatory Visit: Payer: Medicare Other | Admitting: Cardiology

## 2014-07-08 ENCOUNTER — Ambulatory Visit: Payer: Medicare Other | Admitting: Cardiology

## 2014-07-13 ENCOUNTER — Emergency Department (HOSPITAL_COMMUNITY): Payer: Medicare Other

## 2014-07-13 ENCOUNTER — Observation Stay (HOSPITAL_COMMUNITY)
Admission: EM | Admit: 2014-07-13 | Discharge: 2014-07-17 | Disposition: A | Discharge status: Home / Self Care | Payer: Medicare Other | Attending: Family Medicine | Admitting: Family Medicine

## 2014-07-13 ENCOUNTER — Encounter (HOSPITAL_COMMUNITY): Payer: Self-pay | Admitting: Emergency Medicine

## 2014-07-13 ENCOUNTER — Ambulatory Visit: Payer: Medicare Other

## 2014-07-13 DIAGNOSIS — I739 Peripheral vascular disease, unspecified: Secondary | ICD-10-CM | POA: Insufficient documentation

## 2014-07-13 DIAGNOSIS — G5600 Carpal tunnel syndrome, unspecified upper limb: Secondary | ICD-10-CM | POA: Insufficient documentation

## 2014-07-13 DIAGNOSIS — J449 Chronic obstructive pulmonary disease, unspecified: Secondary | ICD-10-CM | POA: Insufficient documentation

## 2014-07-13 DIAGNOSIS — F17210 Nicotine dependence, cigarettes, uncomplicated: Secondary | ICD-10-CM | POA: Diagnosis not present

## 2014-07-13 DIAGNOSIS — I10 Essential (primary) hypertension: Secondary | ICD-10-CM | POA: Diagnosis not present

## 2014-07-13 DIAGNOSIS — I352 Nonrheumatic aortic (valve) stenosis with insufficiency: Secondary | ICD-10-CM | POA: Insufficient documentation

## 2014-07-13 DIAGNOSIS — I4510 Unspecified right bundle-branch block: Secondary | ICD-10-CM | POA: Diagnosis not present

## 2014-07-13 DIAGNOSIS — Z6834 Body mass index (BMI) 34.0-34.9, adult: Secondary | ICD-10-CM | POA: Insufficient documentation

## 2014-07-13 DIAGNOSIS — R509 Fever, unspecified: Secondary | ICD-10-CM | POA: Insufficient documentation

## 2014-07-13 DIAGNOSIS — Z7901 Long term (current) use of anticoagulants: Secondary | ICD-10-CM | POA: Insufficient documentation

## 2014-07-13 DIAGNOSIS — Z86711 Personal history of pulmonary embolism: Secondary | ICD-10-CM | POA: Insufficient documentation

## 2014-07-13 DIAGNOSIS — R531 Weakness: Secondary | ICD-10-CM

## 2014-07-13 DIAGNOSIS — E785 Hyperlipidemia, unspecified: Secondary | ICD-10-CM | POA: Diagnosis not present

## 2014-07-13 DIAGNOSIS — R42 Dizziness and giddiness: Secondary | ICD-10-CM | POA: Diagnosis not present

## 2014-07-13 DIAGNOSIS — I2510 Atherosclerotic heart disease of native coronary artery without angina pectoris: Secondary | ICD-10-CM | POA: Diagnosis not present

## 2014-07-13 DIAGNOSIS — E669 Obesity, unspecified: Secondary | ICD-10-CM | POA: Diagnosis not present

## 2014-07-13 DIAGNOSIS — I509 Heart failure, unspecified: Secondary | ICD-10-CM | POA: Diagnosis not present

## 2014-07-13 DIAGNOSIS — R319 Hematuria, unspecified: Secondary | ICD-10-CM

## 2014-07-13 DIAGNOSIS — I2699 Other pulmonary embolism without acute cor pulmonale: Secondary | ICD-10-CM | POA: Diagnosis present

## 2014-07-13 DIAGNOSIS — I6523 Occlusion and stenosis of bilateral carotid arteries: Secondary | ICD-10-CM | POA: Diagnosis not present

## 2014-07-13 DIAGNOSIS — I951 Orthostatic hypotension: Secondary | ICD-10-CM

## 2014-07-13 DIAGNOSIS — I350 Nonrheumatic aortic (valve) stenosis: Secondary | ICD-10-CM

## 2014-07-13 HISTORY — DX: Headache: R51

## 2014-07-13 HISTORY — DX: Other chronic pain: G89.29

## 2014-07-13 HISTORY — DX: Cardiac murmur, unspecified: R01.1

## 2014-07-13 HISTORY — DX: Headache, unspecified: R51.9

## 2014-07-13 HISTORY — DX: Low back pain: M54.5

## 2014-07-13 HISTORY — DX: Migraine, unspecified, not intractable, without status migrainosus: G43.909

## 2014-07-13 HISTORY — DX: Low back pain, unspecified: M54.50

## 2014-07-13 HISTORY — DX: Gastro-esophageal reflux disease without esophagitis: K21.9

## 2014-07-13 HISTORY — DX: Personal history of other diseases of the digestive system: Z87.19

## 2014-07-13 LAB — I-STAT CHEM 8, ED
BUN: 13 mg/dL (ref 6–23)
CHLORIDE: 100 meq/L (ref 96–112)
Calcium, Ion: 1.17 mmol/L (ref 1.13–1.30)
Creatinine, Ser: 0.9 mg/dL (ref 0.50–1.35)
Glucose, Bld: 107 mg/dL — ABNORMAL HIGH (ref 70–99)
HCT: 58 % — ABNORMAL HIGH (ref 39.0–52.0)
Hemoglobin: 19.7 g/dL — ABNORMAL HIGH (ref 13.0–17.0)
Potassium: 4.4 mEq/L (ref 3.7–5.3)
Sodium: 134 mEq/L — ABNORMAL LOW (ref 137–147)
TCO2: 23 mmol/L (ref 0–100)

## 2014-07-13 LAB — CBC WITH DIFFERENTIAL/PLATELET
BASOS ABS: 0 10*3/uL (ref 0.0–0.1)
Basophils Relative: 0 % (ref 0–1)
EOS PCT: 0 % (ref 0–5)
Eosinophils Absolute: 0 10*3/uL (ref 0.0–0.7)
HCT: 52.7 % — ABNORMAL HIGH (ref 39.0–52.0)
Hemoglobin: 18.6 g/dL — ABNORMAL HIGH (ref 13.0–17.0)
LYMPHS PCT: 5 % — AB (ref 12–46)
Lymphs Abs: 0.5 10*3/uL — ABNORMAL LOW (ref 0.7–4.0)
MCH: 33.3 pg (ref 26.0–34.0)
MCHC: 35.3 g/dL (ref 30.0–36.0)
MCV: 94.4 fL (ref 78.0–100.0)
Monocytes Absolute: 0.7 10*3/uL (ref 0.1–1.0)
Monocytes Relative: 7 % (ref 3–12)
NEUTROS ABS: 8.9 10*3/uL — AB (ref 1.7–7.7)
Neutrophils Relative %: 88 % — ABNORMAL HIGH (ref 43–77)
Platelets: 179 10*3/uL (ref 150–400)
RBC: 5.58 MIL/uL (ref 4.22–5.81)
RDW: 13.5 % (ref 11.5–15.5)
WBC: 10.1 10*3/uL (ref 4.0–10.5)

## 2014-07-13 LAB — URINALYSIS, ROUTINE W REFLEX MICROSCOPIC
BILIRUBIN URINE: NEGATIVE
GLUCOSE, UA: NEGATIVE mg/dL
KETONES UR: 15 mg/dL — AB
Nitrite: NEGATIVE
PROTEIN: NEGATIVE mg/dL
Specific Gravity, Urine: 1.023 (ref 1.005–1.030)
Urobilinogen, UA: 1 mg/dL (ref 0.0–1.0)
pH: 6.5 (ref 5.0–8.0)

## 2014-07-13 LAB — URINE MICROSCOPIC-ADD ON

## 2014-07-13 LAB — I-STAT TROPONIN, ED: Troponin i, poc: 0.03 ng/mL (ref 0.00–0.08)

## 2014-07-13 LAB — I-STAT CG4 LACTIC ACID, ED: LACTIC ACID, VENOUS: 1.45 mmol/L (ref 0.5–2.2)

## 2014-07-13 LAB — PROTIME-INR
INR: 2.12 — ABNORMAL HIGH (ref 0.00–1.49)
PROTHROMBIN TIME: 23.9 s — AB (ref 11.6–15.2)

## 2014-07-13 LAB — CBG MONITORING, ED: GLUCOSE-CAPILLARY: 116 mg/dL — AB (ref 70–99)

## 2014-07-13 MED ORDER — ONDANSETRON HCL 4 MG PO TABS: 4.0000 mg | ORAL_TABLET | Freq: Four times a day (QID) | ORAL | Status: DC | PRN

## 2014-07-13 MED ORDER — OMEGA-3-ACID ETHYL ESTERS 1 G PO CAPS
1.0000 g | ORAL_CAPSULE | Freq: Every day | ORAL | Status: DC
  Administered 2014-07-14 – 2014-07-17 (×4): 1 g via ORAL
  Filled 2014-07-13 (×4): qty 1

## 2014-07-13 MED ORDER — SODIUM CHLORIDE 0.9 % IJ SOLN
3.0000 mL | Freq: Two times a day (BID) | INTRAMUSCULAR | Status: DC
  Administered 2014-07-13 – 2014-07-17 (×8): 3 mL via INTRAVENOUS

## 2014-07-13 MED ORDER — ACETAMINOPHEN 650 MG RE SUPP: 650.0000 mg | Freq: Four times a day (QID) | RECTAL | Status: DC | PRN

## 2014-07-13 MED ORDER — ONDANSETRON HCL 4 MG/2ML IJ SOLN: 4.0000 mg | Freq: Three times a day (TID) | INTRAMUSCULAR | Status: DC | PRN

## 2014-07-13 MED ORDER — SODIUM CHLORIDE 0.9 % IV BOLUS (SEPSIS)
1000.0000 mL | Freq: Once | INTRAVENOUS | Status: AC
  Administered 2014-07-13: 1000 mL via INTRAVENOUS

## 2014-07-13 MED ORDER — ASPIRIN EC 81 MG PO TBEC
81.0000 mg | DELAYED_RELEASE_TABLET | Freq: Every day | ORAL | Status: DC
Start: 2014-07-14 — End: 2014-07-17
  Administered 2014-07-14 – 2014-07-17 (×4): 81 mg via ORAL
  Filled 2014-07-13 (×4): qty 1

## 2014-07-13 MED ORDER — ISOSORBIDE MONONITRATE ER 30 MG PO TB24
30.0000 mg | ORAL_TABLET | Freq: Every day | ORAL | Status: DC
  Administered 2014-07-14: 30 mg via ORAL
  Filled 2014-07-13 (×2): qty 1

## 2014-07-13 MED ORDER — LORAZEPAM 2 MG/ML IJ SOLN: 1.0000 mg | Freq: Once | INTRAMUSCULAR | Status: DC

## 2014-07-13 MED ORDER — LOSARTAN POTASSIUM 50 MG PO TABS
50.0000 mg | ORAL_TABLET | Freq: Every day | ORAL | Status: DC
  Administered 2014-07-14 – 2014-07-17 (×4): 50 mg via ORAL
  Filled 2014-07-13 (×4): qty 1

## 2014-07-13 MED ORDER — MECLIZINE HCL 25 MG PO TABS
25.0000 mg | ORAL_TABLET | Freq: Three times a day (TID) | ORAL | Status: DC | PRN
  Filled 2014-07-13: qty 1

## 2014-07-13 MED ORDER — COQ10 100 MG PO CAPS: 1.0000 | ORAL_CAPSULE | Freq: Every day | ORAL | Status: DC

## 2014-07-13 MED ORDER — MECLIZINE HCL 25 MG PO TABS
25.0000 mg | ORAL_TABLET | Freq: Once | ORAL | Status: AC
  Administered 2014-07-13: 25 mg via ORAL
  Filled 2014-07-13: qty 1

## 2014-07-13 MED ORDER — ONDANSETRON HCL 4 MG/2ML IJ SOLN: 4.0000 mg | Freq: Four times a day (QID) | INTRAMUSCULAR | Status: DC | PRN

## 2014-07-13 MED ORDER — ACETAMINOPHEN 325 MG PO TABS
650.0000 mg | ORAL_TABLET | Freq: Four times a day (QID) | ORAL | Status: DC | PRN
  Administered 2014-07-14 (×2): 650 mg via ORAL
  Filled 2014-07-13 (×2): qty 2

## 2014-07-13 MED ORDER — WARFARIN - PHARMACIST DOSING INPATIENT
Freq: Every day | Status: DC
  Administered 2014-07-14: 1

## 2014-07-13 MED ORDER — OMEGA-3 FATTY ACIDS 1000 MG PO CAPS: 1.0000 g | ORAL_CAPSULE | Freq: Every day | ORAL | Status: DC

## 2014-07-13 MED ORDER — FLUTICASONE PROPIONATE 50 MCG/ACT NA SUSP
1.0000 | Freq: Every day | NASAL | Status: DC
  Administered 2014-07-14 – 2014-07-17 (×4): 1 via NASAL
  Filled 2014-07-13: qty 16

## 2014-07-13 MED ORDER — WARFARIN SODIUM 5 MG PO TABS
5.0000 mg | ORAL_TABLET | ORAL | Status: DC
  Administered 2014-07-14: 5 mg via ORAL
  Filled 2014-07-13: qty 1

## 2014-07-13 MED ORDER — ATORVASTATIN CALCIUM 80 MG PO TABS
80.0000 mg | ORAL_TABLET | Freq: Every day | ORAL | Status: DC
  Administered 2014-07-14 – 2014-07-17 (×4): 80 mg via ORAL
  Filled 2014-07-13 (×4): qty 1

## 2014-07-13 MED ORDER — WARFARIN SODIUM 2.5 MG PO TABS
2.5000 mg | ORAL_TABLET | ORAL | Status: DC
  Filled 2014-07-13: qty 1

## 2014-07-13 NOTE — H&P (Signed)
[facility] History and Physical  Chris Reyes:[id number] DOB: 1951/05/16 DOA: 07/13/2014  Referring physician: ER physician. PCP: Gwendolyn Grant, MD   Chief Complaint: Dizziness.  HPI: Chris Reyes is a 63 y.o. male with history of CAD, aortic stenosis, pulmonary embolism on Coumadin, hyperlipidemia started experiencing dizziness since last night. Patient states that he was feeling things spinning around whenever he tries to move. Denies any nausea vomiting loss of consciousness. Patient has been having some ringing sound in his ear. Also has been recently having some headaches. Since patient's symptoms persisted patient came to the ER and MRI brain done was negative for anything acute. Patient was finding it difficult to ambulate because of persistent dizziness particularly on motion. Patient also was found to be mildly febrile. UA showing features of UTI. Patient also had complained of nonproductive cough for last few days but chest x-ray was showing nothing acute.   Review of Systems: As presented in the history of presenting illness, rest negative.  Past Medical History  Diagnosis Date  . Hypertension   . Hernia   . COPD (chronic obstructive pulmonary disease)   . History of blood clots 2007    in lungs  . CAD (coronary artery disease)     Catheterization, [place], July, 2006, EF 45%,  50% LAD, 50% RCA, 90% marginal, possible collaterals from LAD  to a branch of the PDA, medical therapy recommended  . GI bleed     2006, no significant abnormalities per the patient  . PAD (peripheral artery disease)     Arterial leg Dopplers, April, 2014, normal  . Ejection fraction     EF 45%, catheterization, 2006  //   EF 55%, echo, March, 2012, wall abnormality at the base of the inferior wall  . Aortic stenosis     severe echo 01/2013- cannot rule out bicuspid aortic valve  //   moderately severe, echo,  March, 2012 ( progression since 2009)  . RBBB   . Pulmonary emboli      Significant, 2007, Coumadin therapy  . Warfarin anticoagulation     History pulmonary emboli  . Bradycardia   . Tobacco abuse   . Carotid artery disease     Doppler, March, 2012,  0-39% bilateral  . Dyslipidemia    Past Surgical History  Procedure Laterality Date  . Back surgery  1988, 1990, 2006    2 surgeries. disc fusion, pins installed. (L-5-S1)  . Cholecystectomy  1998  . Nerve rotation      left arm  . Tonsillectomy    . Gallbladder surgery     Social History:  reports that he has been smoking Cigarettes.  He has a 10 pack-year smoking history. He has never used smokeless tobacco. He reports that he drinks alcohol. He reports that he does not use illicit drugs. Where does patient live home. Can patient participate in ADLs? Yes.  No Known Allergies  Family History:  Family History  Problem Relation Age of Onset  . Diabetes Father   . Cancer Father     large cell lung cancer  . Heart disease Mother       Prior to Admission medications   Medication Sig Start Date End Date Taking? Authorizing Provider  acetaminophen (TYLENOL) 500 MG tablet Take 1,500 mg by mouth every 6 (six) hours as needed (pain).   Yes Historical Provider, MD  aspirin EC 81 MG EC tablet Take 1 tablet (81 mg total) by mouth daily. 04/13/14  Yes Janett Billow  U Vann, DO  atorvastatin (LIPITOR) 80 MG tablet Take 1 tablet (80 mg total) by mouth daily. 02/02/13  Yes Neena Rhymes, MD  Coenzyme Q10 (COQ10) 100 MG CAPS Take 1 capsule by mouth daily.    Yes Historical Provider, MD  fish oil-omega-3 fatty acids 1000 MG capsule Take 1 g by mouth daily.   Yes Historical Provider, MD  fluticasone (FLONASE) 50 MCG/ACT nasal spray Place 1 spray into both nostrils daily. 08/30/13  Yes Rowe Clack, MD  ibuprofen (ADVIL,MOTRIN) 200 MG tablet Take 600 mg by mouth every 6 (six) hours as needed (pain).   Yes Historical Provider, MD  isosorbide mononitrate (IMDUR) 30 MG 24 hr tablet Take 30 mg by mouth daily.   Yes  Historical Provider, MD  losartan (COZAAR) 50 MG tablet Take 1 tablet (50 mg total) by mouth daily. 02/01/14  Yes Rowe Clack, MD  warfarin (COUMADIN) 5 MG tablet Take 2.5-5 mg by mouth daily. Takes 5mg  on Tues and Thursdays and half a tab 2.5 mg Mon. Wed, Fri, Sat, Sun   Yes Historical Provider, MD    Physical Exam: Filed Vitals:   07/13/14 2015 07/13/14 2041 07/13/14 2045 07/13/14 2115  BP: 106/67 109/65 103/58 116/77  Pulse: 110 100 106 107  Temp:      TempSrc:      Resp: 25 22 30 21   SpO2: 96% 96% 95% 94%     General:  Well-developed and nourished.  Eyes: anicteric no pallor.  ENT: no discharge from the ears eyes nose and mouth.  Neck: no mass felt.  Cardiovascular: S1-S2 heard.  Respiratory: no rhonchi or crepitations.  Abdomen: soft nontender bowel sounds.  Skin: no rash.  Musculoskeletal: no edema.  Psychiatric: appears normal.  Neurologic: alert awake oriented to time place and person. Moves all extremities 5 x 5. No facial asymmetry. No nystagmus. PERRLA positive.  Labs on Admission:  Basic Metabolic Panel:  Recent Labs Lab 07/13/14 1635  NA 134*  K 4.4  CL 100  GLUCOSE 107*  BUN 13  CREATININE 0.90   Liver Function Tests: No results for input(s): AST, ALT, ALKPHOS, BILITOT, PROT, ALBUMIN in the last 168 hours. No results for input(s): LIPASE, AMYLASE in the last 168 hours. No results for input(s): AMMONIA in the last 168 hours. CBC:  Recent Labs Lab 07/13/14 1524 07/13/14 1635  WBC 10.1  --   NEUTROABS 8.9*  --   HGB 18.6* 19.7*  HCT 52.7* 58.0*  MCV 94.4  --   PLT 179  --    Cardiac Enzymes: No results for input(s): CKTOTAL, CKMB, CKMBINDEX, TROPONINI in the last 168 hours.  BNP (last 3 results) No results for input(s): PROBNP in the last 8760 hours. CBG:  Recent Labs Lab 07/13/14 1528  GLUCAP 116*    Radiological Exams on Admission: Dg Chest 2 View  07/13/2014   CLINICAL DATA:  Dizziness for 2 months now unable to  walk, cough for 2 days, smoker, history COPD, hypertension, peripheral vascular disease, carotid artery disease, coronary artery disease, bradycardia, pulmonary embolism  EXAM: CHEST  2 VIEW  COMPARISON:  A 07/2014  FINDINGS: Enlargement of cardiac silhouette with pulmonary vascular congestion.  Mediastinal contours normal.  Lungs emphysematous but clear.  No pleural effusion or pneumothorax.  Multilevel endplate spur formation thoracic spine.  IMPRESSION: Enlargement of cardiac silhouette with pulmonary vascular congestion.  COPD changes without infiltrate.   Electronically Signed   By: Lavonia Dana M.D.   On: 07/13/2014 16:45  Mr Brain Wo Contrast  07/13/2014   CLINICAL DATA:  Dizziness and generalized weakness.  EXAM: MRI HEAD WITHOUT CONTRAST  TECHNIQUE: Multiplanar, multiecho pulse sequences of the brain and surrounding structures were obtained without intravenous contrast.  COMPARISON:  MRI brain 04/12/2014  FINDINGS: The diffusion-weighted images demonstrate no evidence for acute or subacute infarction. Mild periventricular white matter changes bilaterally are similar to the prior study. Mild generalized atrophy is unchanged. The ventricles are proportionate to the degree of atrophy. Dilated perivascular spaces are again noted bilaterally. No significant extraaxial fluid collection is present.  Flow is present in the major intracranial arteries. The globes and orbits are intact. A midline structures are within normal limits. Small polyp or mucous retention cyst is present inferiorly in the right maxillary sinus. The remaining paranasal sinuses and the mastoid air cells are clear.  IMPRESSION: 1. No acute intracranial abnormality or significant interval change. 2. Mild atrophy and white matter disease is stable since the prior exam. 3. Polyps or mucous retention cysts at the floor of the right maxillary sinus are stable as well.   Electronically Signed   By: Lawrence Santiago M.D.   On: 07/13/2014 18:50     EKG: Independently reviewed. Sinus tachycardia with RBBB.  Assessment/Plan Principal Problem:   Vertigo Active Problems:   Essential hypertension   CAD (coronary artery disease)   Aortic stenosis   Pulmonary emboli   1. Vertigo/dizziness - patient's symptoms are consistent with benign positional vertigo. Patient will be placed on when necessary meclizine and get physical therapy consult. If patient's symptoms does not improve may need benzodiazepine. 2. Fever - check blood cultures and urine cultures. Possible UTI may be the source. Patient has been placed on ceftriaxone. Check influenza PCR. 3. History of PE on Coumadin - Coumadin per pharmacy. 4. Aortic stenosis - patient is planning to get surgery later this year. 5. History of CAD - denies any chest pain. 6. Hyperlipidemia - continue statins.    Code Status: full code.  Family Communication: none.  Disposition Plan: admit for observation.    Helmer Dull N. [facility] Pager [phone].  If 7PM-7AM, please contact night-coverage [email or web address] Password [email or web address] 07/13/2014, 9:52 PM

## 2014-07-13 NOTE — ED Notes (Signed)
Hospitalist at bedside 

## 2014-07-13 NOTE — ED Provider Notes (Signed)
The patient has had ongoing symptoms with any movement, despite a negative MRI he will require admission to the hospital due to his inability to ambulate or care for himself at home. Discussed with the hospitalist who will admit.  Dr. Hal Hope.  Johnna Acosta, MD 07/13/14 2045

## 2014-07-13 NOTE — ED Notes (Signed)
Dr Miller at bedside. 

## 2014-07-13 NOTE — ED Provider Notes (Signed)
CSN: [id number]     Arrival date & time 07/13/14  1430 History   First MD Initiated Contact with Patient 07/13/14 1436     Chief Complaint  Patient presents with  . Dizziness     (Consider location/radiation/quality/duration/timing/severity/associated sxs/prior Treatment) HPI   63 year old male with prior history of PE currently on warfarin, history of hypertension, CAD, PAD, aortic stenosis, right bundle branch block who presents via EMS from home with complaints of dizziness and weakness.patient reports since last night he has been having increased sensation of dizziness and weakness. Described dizziness as "somebody is pushing me backward" worsening with positional change. Symptom is getting progressively worse. He reported generalized weakness. He denies having any recent fall or loss of consciousness. He usually does not use any assisted device for walking but today he was using a cane just for support. He reported having sinus congestion for the past several weeks and report occasional ringing in his ears, but denies hip pain.  report occasional shortness of breath but this is baseline and no increased cough or hemoptysis. Denies any chest pain. He has been eating and drinking as normal, no nausea vomiting or diarrhea.  he reported an episode of diaphoresis this morning while having this dizziness. Dizziness is worsening with positional change and improves with laying still.  Patient recall having similar symptoms back in August when he was admitted for 3 days and was treated for dehydration. Patient however states he has been eating and drinking as usual and denies any dysuria. He denies having any vision loss, confusion, focal numbness or weakness. Patient is currently taking warfarin. He does not complain of a headache.  Past Medical History  Diagnosis Date  . Hypertension   . Hernia   . COPD (chronic obstructive pulmonary disease)   . History of blood clots 2007    in lungs  . CAD  (coronary artery disease)     Catheterization, [place], July, 2006, EF 45%,  50% LAD, 50% RCA, 90% marginal, possible collaterals from LAD  to a branch of the PDA, medical therapy recommended  . GI bleed     2006, no significant abnormalities per the patient  . PAD (peripheral artery disease)     Arterial leg Dopplers, April, 2014, normal  . Ejection fraction     EF 45%, catheterization, 2006  //   EF 55%, echo, March, 2012, wall abnormality at the base of the inferior wall  . Aortic stenosis     severe echo 01/2013- cannot rule out bicuspid aortic valve  //   moderately severe, echo,  March, 2012 ( progression since 2009)  . RBBB   . Pulmonary emboli     Significant, 2007, Coumadin therapy  . Warfarin anticoagulation     History pulmonary emboli  . Bradycardia   . Tobacco abuse   . Carotid artery disease     Doppler, March, 2012,  0-39% bilateral  . Dyslipidemia    Past Surgical History  Procedure Laterality Date  . Back surgery  1988, 1990, 2006    2 surgeries. disc fusion, pins installed. (L-5-S1)  . Cholecystectomy  1998  . Nerve rotation      left arm  . Tonsillectomy    . Gallbladder surgery     Family History  Problem Relation Age of Onset  . Diabetes Father   . Cancer Father     large cell lung cancer  . Heart disease Mother    History  Substance Use Topics  . Smoking  status: Current Every Day Smoker -- 0.50 packs/day for 20 years    Types: Cigarettes  . Smokeless tobacco: Never Used     Comment: 5-10 cigarettes per day  . Alcohol Use: Yes     Comment: 1 drink a month    Review of Systems  All other systems reviewed and are negative.     Allergies  Review of patient's allergies indicates no known allergies.  Home Medications   Prior to Admission medications   Medication Sig Start Date End Date Taking? Authorizing Provider  acetaminophen (TYLENOL) 500 MG tablet Take 1,500 mg by mouth every 6 (six) hours as needed (pain).    Historical Provider,  MD  aspirin EC 81 MG EC tablet Take 1 tablet (81 mg total) by mouth daily. 04/13/14   Geradine Girt, DO  atorvastatin (LIPITOR) 80 MG tablet Take 1 tablet (80 mg total) by mouth daily. 02/02/13   Neena Rhymes, MD  Coenzyme Q10 (COQ10) 100 MG CAPS Take 1 capsule by mouth daily.     Historical Provider, MD  fish oil-omega-3 fatty acids 1000 MG capsule Take 1 g by mouth daily.    Historical Provider, MD  fluticasone (FLONASE) 50 MCG/ACT nasal spray Place 1 spray into both nostrils daily. 08/30/13   Rowe Clack, MD  ibuprofen (ADVIL,MOTRIN) 200 MG tablet Take 600 mg by mouth every 6 (six) hours as needed (pain).    Historical Provider, MD  isosorbide mononitrate (IMDUR) 30 MG 24 hr tablet Take 30 mg by mouth daily.    Historical Provider, MD  losartan (COZAAR) 50 MG tablet Take 1 tablet (50 mg total) by mouth daily. 02/01/14   Rowe Clack, MD  warfarin (COUMADIN) 5 MG tablet Take 2.5-5 mg by mouth daily. Takes 5mg  on Tues and Thursdays and half a tab 2.5 mg Mon. Wed, Fri, Sat, Sun    Historical Provider, MD   SpO2 96% Physical Exam  Constitutional: He is oriented to person, place, and time. He appears well-developed and well-nourished. No distress.  HENT:  Head: Normocephalic and atraumatic.  Right Ear: External ear normal.  Left Ear: External ear normal.  Mouth/Throat: Oropharynx is clear and moist.  Eyes: Conjunctivae and EOM are normal. Pupils are equal, round, and reactive to light.  No appreciable nystagmus  Neck: Normal range of motion. Neck supple. No JVD present.  No nuchal rigidity  Cardiovascular: Normal rate and regular rhythm.  Exam reveals no gallop and no friction rub.   Murmur (systolic murmur best heard at second intercostal space on left chest) heard. Pulmonary/Chest:  Normal breath sounds with poor effort  Abdominal: Soft.  Umbilical hernia noted, reducible, nontender  Musculoskeletal: He exhibits no edema.  Neurological: He is alert and oriented to person,  place, and time. No cranial nerve deficit or sensory deficit. GCS eye subscore is 4. GCS verbal subscore is 5. GCS motor subscore is 6.  Neurologic exam:  Speech clear, pupils equal round reactive to light, extraocular movements intact  Normal peripheral visual fields Cranial nerves III through XII normal including no facial droop Follows commands, moves all extremities x4, normal strength to bilateral upper and lower extremities at all major muscle groups including grip Sensation normal to light touch  Coordination intact, no limb ataxia, finger-nose-finger normal Rapid alternating movements normal No pronator drift Gait not tested   Skin: No rash noted.  Lipoma noted to mid upper back, nontender and noninfected.  Psychiatric: He has a normal mood and affect.  Nursing note  and vitals reviewed.   ED Course  Procedures (including critical care time)  3:22 PM Patient presents with dizziness. It appears to be likely peripheral cause as it is worsening with positional change. He has a normal HINTS exam. No obvious focal neuro deficit on exam however patient reports he is too weak to ambulate, therefore gait has not been tested. He is able to move all 4 extremities. He denies any significant headache. Vital sign with evidence of hypotension and tachycardia without any fever.  IV fluid given, Ativan and meclizine given, will obtain brain MRI to rule out posterior circulation stroke, will check UA and chest x-ray to rule out metabolic cause from infection, EKG and troponin along with synovitis sign noted. Care discussed with Dr. Thurnell Garbe.  Anticipate admission for further evaluation.    4:07 PM UA with moderate hemoglobin on urine dipstick, small amount of ketone but no evidence of UTI. Patient denies having hematuria. He endorsed chronic low back pain but no flank pain and no prior history of kidney stone. Pt was not cath when his urine was obtained. Patient will likely need further workup for  evaluation of a symptomatic hematuria.  His INR is therapeutic at 2.12. He has normal lactic acid. He is hemoconcentrated with a hemoglobin of 18.6, not far from his baseline. EKG shows no acute ischemic changes.  4:31 PM Suspect pt will likely need admission for his vertiginous sxs and generalized weakness with positive orthostasis.  Pt is dehydrated, IVF given.  Care discussed with oncoming provider who will f/u on Brain MRI and will call for admission.    Labs Review Labs Reviewed  CBC WITH DIFFERENTIAL - Abnormal; Notable for the following:    Hemoglobin 18.6 (*)    HCT 52.7 (*)    Neutrophils Relative % 88 (*)    Neutro Abs 8.9 (*)    Lymphocytes Relative 5 (*)    Lymphs Abs 0.5 (*)    All other components within normal limits  URINALYSIS, ROUTINE W REFLEX MICROSCOPIC - Abnormal; Notable for the following:    Color, Urine AMBER (*)    Hgb urine dipstick MODERATE (*)    Ketones, ur 15 (*)    Leukocytes, UA SMALL (*)    All other components within normal limits  PROTIME-INR - Abnormal; Notable for the following:    Prothrombin Time 23.9 (*)    INR 2.12 (*)    All other components within normal limits  CBG MONITORING, ED - Abnormal; Notable for the following:    Glucose-Capillary 116 (*)    All other components within normal limits  URINE CULTURE  URINE MICROSCOPIC-ADD ON  I-STAT CHEM 8, ED  I-STAT CG4 LACTIC ACID, ED  I-STAT TROPOININ, ED    Imaging Review No results found.   EKG Interpretation   Date/Time:  Wednesday July 13 2014 14:38:50 EST Ventricular Rate:  108 PR Interval:  198 QRS Duration: 141 QT Interval:  337 QTC Calculation: 452 R Axis:   51 Text Interpretation:  Sinus tachycardia Right bundle branch block When  compared with ECG of 04/12/2014 No significant change was found Confirmed  by [facility]  MD, KATHLEEN [phone]) on 07/13/2014 2:45:45 PM      MDM   Final diagnoses:  Weakness  Dizzy  Painless hematuria    BP 104/67 mmHg  Pulse 100   Temp(Src) 99.1 F (37.3 C) (Oral)  Resp 22  SpO2 94%     Domenic Moras, PA-C 07/14/14 [place], DO 07/16/14 [phone]

## 2014-07-13 NOTE — ED Notes (Signed)
Per EMS, pt comes from home with c/o dizziness and weakness.  Pt was found on his knees near the bed due to the weakness. Pt did not fall or no LOC. Pt A&OX4, NAD noted. Pt denies chest pain. Pt has h/o hypertension and is going to have an aortic heart valve replacement next year in January. VSS, RBB noted on 12 lead. 20 G IV place in left forearm

## 2014-07-13 NOTE — ED Notes (Addendum)
Pt CBG 116. RN notified. 

## 2014-07-13 NOTE — Progress Notes (Signed)
ANTICOAGULATION CONSULT NOTE - Initial Consult  Pharmacy Consult for coumadin Indication: DVT  No Known Allergies  Patient Measurements: Height: 5\' 11"  (180.3 cm) Weight: 245 lb 12.8 oz (111.494 kg) IBW/kg (Calculated) : 75.3   Vital Signs: Temp: 99.3 F (37.4 C) (11/11 2213) Temp Source: Oral (11/11 2213) BP: 114/76 mmHg (11/11 2213) Pulse Rate: 98 (11/11 2213)  Labs:  Recent Labs  07/13/14 1524 07/13/14 1635  HGB 18.6* 19.7*  HCT 52.7* 58.0*  PLT 179  --   LABPROT 23.9*  --   INR 2.12*  --   CREATININE  --  0.90    Estimated Creatinine Clearance: 105.3 mL/min (by C-G formula based on Cr of 0.9).   Medical History: Past Medical History  Diagnosis Date  . Hypertension   . Hernia   . COPD (chronic obstructive pulmonary disease)   . History of blood clots 2007    in lungs  . CAD (coronary artery disease)     Catheterization, [place], July, 2006, EF 45%,  50% LAD, 50% RCA, 90% marginal, possible collaterals from LAD  to a branch of the PDA, medical therapy recommended  . GI bleed     2006, no significant abnormalities per the patient  . PAD (peripheral artery disease)     Arterial leg Dopplers, April, 2014, normal  . Ejection fraction     EF 45%, catheterization, 2006  //   EF 55%, echo, March, 2012, wall abnormality at the base of the inferior wall  . Aortic stenosis     severe echo 01/2013- cannot rule out bicuspid aortic valve  //   moderately severe, echo,  March, 2012 ( progression since 2009)  . RBBB   . Pulmonary emboli     Significant, 2007, Coumadin therapy  . Warfarin anticoagulation     History pulmonary emboli  . Bradycardia   . Tobacco abuse   . Carotid artery disease     Doppler, March, 2012,  0-39% bilateral  . Dyslipidemia     Medications:  Prescriptions prior to admission  Medication Sig Dispense Refill Last Dose  . acetaminophen (TYLENOL) 500 MG tablet Take 1,500 mg by mouth every 6 (six) hours as needed (pain).   07/12/2014 at  Unknown time  . aspirin EC 81 MG EC tablet Take 1 tablet (81 mg total) by mouth daily.   07/12/2014 at 8a  . atorvastatin (LIPITOR) 80 MG tablet Take 1 tablet (80 mg total) by mouth daily. 30 tablet 11 07/12/2014 at Unknown time  . Coenzyme Q10 (COQ10) 100 MG CAPS Take 1 capsule by mouth daily.    07/12/2014 at Unknown time  . fish oil-omega-3 fatty acids 1000 MG capsule Take 1 g by mouth daily.   07/12/2014 at Unknown time  . fluticasone (FLONASE) 50 MCG/ACT nasal spray Place 1 spray into both nostrils daily. 16 g 2 07/13/2014 at Unknown time  . ibuprofen (ADVIL,MOTRIN) 200 MG tablet Take 600 mg by mouth every 6 (six) hours as needed (pain).   Past Week at Unknown time  . isosorbide mononitrate (IMDUR) 30 MG 24 hr tablet Take 30 mg by mouth daily.   07/13/2014 at Unknown time  . losartan (COZAAR) 50 MG tablet Take 1 tablet (50 mg total) by mouth daily. 90 tablet 3 07/13/2014 at Unknown time  . warfarin (COUMADIN) 5 MG tablet Take 2.5-5 mg by mouth daily. Takes 5mg  on Tues and Thursdays and half a tab 2.5 mg Mon. Wed, Fri, Sat, Sun   07/13/2014 at 12p  Assessment: 63 yo man admitted with vertigo to continue coumadin for h/o DVT/PE.  His INR is therapeutic on home regimen of coumadin. Goal of Therapy:  INR 2-3 Monitor platelets by anticoagulation protocol: Yes   Plan:  Cont coumadin home regimen Daily PT/INR  Thanks for allowing pharmacy to be a part of this patient's care.  Excell Seltzer, PharmD Clinical Pharmacist, [phone] 07/13/2014,10:35 PM

## 2014-07-14 LAB — COMPREHENSIVE METABOLIC PANEL
ALK PHOS: 53 U/L (ref 39–117)
ALT: 25 U/L (ref 0–53)
AST: 26 U/L (ref 0–37)
Albumin: 3.1 g/dL — ABNORMAL LOW (ref 3.5–5.2)
Anion gap: 15 (ref 5–15)
BUN: 13 mg/dL (ref 6–23)
CO2: 20 mEq/L (ref 19–32)
Calcium: 8.6 mg/dL (ref 8.4–10.5)
Chloride: 98 mEq/L (ref 96–112)
Creatinine, Ser: 0.9 mg/dL (ref 0.50–1.35)
GFR calc Af Amer: >90 mL/min (ref >90)
GFR calc non Af Amer: 88 mL/min — ABNORMAL LOW (ref >90)
Glucose, Bld: 104 mg/dL — ABNORMAL HIGH (ref 70–99)
Potassium: 3.8 mEq/L (ref 3.7–5.3)
SODIUM: 133 meq/L — AB (ref 137–147)
TOTAL PROTEIN: 7 g/dL (ref 6.0–8.3)
Total Bilirubin: 0.9 mg/dL (ref 0.3–1.2)

## 2014-07-14 LAB — CBC WITH DIFFERENTIAL/PLATELET
BASOS PCT: 0 % (ref 0–1)
Basophils Absolute: 0 10*3/uL (ref 0.0–0.1)
Eosinophils Absolute: 0 10*3/uL (ref 0.0–0.7)
Eosinophils Relative: 0 % (ref 0–5)
HCT: 48.1 % (ref 39.0–52.0)
HEMOGLOBIN: 17.2 g/dL — AB (ref 13.0–17.0)
Lymphocytes Relative: 8 % — ABNORMAL LOW (ref 12–46)
Lymphs Abs: 0.7 10*3/uL (ref 0.7–4.0)
MCH: 32.9 pg (ref 26.0–34.0)
MCHC: 35.8 g/dL (ref 30.0–36.0)
MCV: 92 fL (ref 78.0–100.0)
MONO ABS: 1.1 10*3/uL — AB (ref 0.1–1.0)
Monocytes Relative: 12 % (ref 3–12)
Neutro Abs: 7.3 10*3/uL (ref 1.7–7.7)
Neutrophils Relative %: 80 % — ABNORMAL HIGH (ref 43–77)
Platelets: 159 10*3/uL (ref 150–400)
RBC: 5.23 MIL/uL (ref 4.22–5.81)
RDW: 13.5 % (ref 11.5–15.5)
WBC: 9.2 10*3/uL (ref 4.0–10.5)

## 2014-07-14 LAB — SEDIMENTATION RATE: SED RATE: 5 mm/h (ref 0–16)

## 2014-07-14 LAB — URINE CULTURE
Colony Count: NO GROWTH
Culture: NO GROWTH

## 2014-07-14 LAB — LACTIC ACID, PLASMA: Lactic Acid, Venous: 0.8 mmol/L (ref 0.5–2.2)

## 2014-07-14 LAB — INFLUENZA PANEL BY PCR (TYPE A & B)
H1N1 flu by pcr: NOT DETECTED
INFLBPCR: NEGATIVE
Influenza A By PCR: NEGATIVE

## 2014-07-14 MED ORDER — CEFTRIAXONE SODIUM IN DEXTROSE 20 MG/ML IV SOLN
1.0000 g | INTRAVENOUS | Status: DC
  Administered 2014-07-15: 1 g via INTRAVENOUS
  Filled 2014-07-14: qty 50

## 2014-07-14 MED ORDER — CEFTRIAXONE SODIUM IN DEXTROSE 20 MG/ML IV SOLN
1.0000 g | Freq: Once | INTRAVENOUS | Status: AC
  Administered 2014-07-14: 1 g via INTRAVENOUS
  Filled 2014-07-14: qty 50

## 2014-07-14 MED ORDER — SODIUM CHLORIDE 0.9 % IV BOLUS (SEPSIS)
250.0000 mL | Freq: Once | INTRAVENOUS | Status: AC
  Administered 2014-07-14: 250 mL via INTRAVENOUS

## 2014-07-14 NOTE — Plan of Care (Signed)
Problem: Phase I Progression Outcomes Goal: Voiding-avoid urinary catheter unless indicated Outcome: Completed/Met Date Met:  07/14/14 Goal: Other Phase I Outcomes/Goals Outcome: Not Applicable Date Met:  93/79/02  Problem: Phase II Progression Outcomes Goal: IV changed to normal saline lock Outcome: Completed/Met Date Met:  07/14/14 Goal: Obtain order to discontinue catheter if appropriate Outcome: Not Applicable Date Met:  40/97/35 Goal: Other Phase II Outcomes/Goals Outcome: Not Applicable Date Met:  32/99/24  Problem: Phase III Progression Outcomes Goal: Voiding independently Outcome: Completed/Met Date Met:  07/14/14 Goal: Foley discontinued Outcome: Not Applicable Date Met:  26/83/41 Goal: Other Phase III Outcomes/Goals Outcome: Not Applicable Date Met:  96/22/29  Problem: Discharge Progression Outcomes Goal: Other Discharge Outcomes/Goals Outcome: Not Applicable Date Met:  79/89/21

## 2014-07-14 NOTE — Progress Notes (Signed)
Nutrition Brief Note  Patient identified on the Malnutrition Screening Tool (MST) Report for weight loss. Patient reports that he has been trying to lose weight; weight loss was not unintentional.  Wt Readings from Last 15 Encounters:  07/13/14 245 lb 12.8 oz (111.494 kg)  06/15/14 252 lb 12.8 oz (114.669 kg)  06/06/14 250 lb (113.399 kg)  05/10/14 262 lb (118.842 kg)  04/12/14 264 lb 15.9 oz (120.2 kg)  02/01/14 287 lb 3.2 oz (130.273 kg)  01/05/14 294 lb (133.358 kg)  12/21/13 294 lb (133.358 kg)  02/02/13 285 lb 6.4 oz (129.457 kg)  01/15/13 288 lb (130.636 kg)  12/10/12 291 lb (131.997 kg)  07/27/12 289 lb (131.09 kg)  11/19/10 294 lb (133.358 kg)  11/06/10 294 lb (133.358 kg)  09/25/10 296 lb (134.265 kg)    Body mass index is 34.3 kg/(m^2). Patient meets criteria for obesity, class 1 based on current BMI.   Current diet order is heart healthy, patient is eating well at this time. Labs and medications reviewed.   No nutrition interventions warranted at this time. If nutrition issues arise, please consult RD.   Molli Barrows, RD, LDN, [place] Pager [phone] After Hours Pager [phone]

## 2014-07-14 NOTE — Progress Notes (Signed)
63yo male c/o dizziness and weakness, UA abnormal, to begin IV ABX.  Will start Rocephin 1g IV Q24H and monitor CBC and Cx.  Wynona Neat, PharmD, BCPS 07/14/2014 12:29 AM

## 2014-07-14 NOTE — Progress Notes (Signed)
UR completed 

## 2014-07-14 NOTE — Evaluation (Signed)
Physical Therapy Evaluation Patient Details Name: Chris Reyes MRN: [id number] DOB: 05/10/1951 Today's Date: 07/14/2014   History of Present Illness  Chris Reyes is a 63 y.o. male with history of CAD, aortic stenosis, pulmonary embolism on Coumadin, hyperlipidemia started experiencing dizziness since last night. Patient states that he was feeling things spinning around whenever he tries to move. Denies any nausea vomiting loss of consciousness. Patient has been having some ringing sound in his ear. Also has been recently having some headaches. Since patient's symptoms persisted patient came to the ER and MRI brain done was negative for anything acute. Patient was finding it difficult to ambulate because of persistent dizziness particularly on motion. Patient also was found to be mildly febrile. UA showing features of UTI. Patient also had complained of nonproductive cough for last few days but chest x-ray was showing nothing acute.  Clinical Impression  Pt admitted with/for vertigo. Vestibular testing resulted in vertigo from unknown source, suspect labrinthytis.  Will get pt to try Meclizine and assess tomorrow.   Pt currently limited functionally due to the problems listed below.  (see problems list.)  Pt will benefit from PT to maximize function and safety to be able to get home safely with available assist of family.     Follow Up Recommendations No PT follow up    Equipment Recommendations  Other (comment) (TBA)    Recommendations for Other Services       Precautions / Restrictions Precautions Precautions: Fall      Mobility  Bed Mobility Overal bed mobility: Needs Assistance Bed Mobility: Sidelying to Sit;Sit to Supine   Sidelying to sit: Min guard   Sit to supine: Supervision   General bed mobility comments: guard/supervision due to dysequilibrium  Transfers Overall transfer level: Needs assistance Equipment used: Rolling walker (2 wheeled) Transfers: Sit to/from  Stand Sit to Stand: Min guard         General transfer comment: cues for safety, guard for dysequilibrium  Ambulation/Gait Ambulation/Gait assistance: Min assist Ambulation Distance (Feet): 25 Feet Assistive device: Rolling walker (2 wheeled) Gait Pattern/deviations: Step-through pattern Gait velocity: slow   General Gait Details: slow and guard with heavy use of RW to combat listing to either direction from vertigo/dyequilibrium  Stairs            Wheelchair Mobility    Modified Rankin (Stroke Patients Only)       Balance Overall balance assessment: Needs assistance Sitting-balance support: Bilateral upper extremity supported Sitting balance-Leahy Scale: Poor Sitting balance - Comments: tends to fall posteriorly and to left unless pt stabilizes himself with UE's   Standing balance support: Bilateral upper extremity supported Standing balance-Leahy Scale: Poor                               Pertinent Vitals/Pain Pain Assessment: No/denies pain    Home Living Family/patient expects to be discharged to:: Private residence Living Arrangements: Spouse/significant other Available Help at Discharge: Family;Available 24 hours/day Type of Home: House Home Access: Stairs to enter Entrance Stairs-Rails: None Entrance Stairs-Number of Steps: 3 Home Layout: One level Home Equipment: Shower seat;Grab bars - tub/shower;Adaptive equipment      Prior Function Level of Independence: Independent               Hand Dominance   Dominant Hand: Left    Extremity/Trunk Assessment               Lower  Extremity Assessment: Overall WFL for tasks assessed      Cervical / Trunk Assessment: Normal  Communication   Communication: No difficulties  Cognition Arousal/Alertness: Awake/alert Behavior During Therapy: WFL for tasks assessed/performed Overall Cognitive Status: Within Functional Limits for tasks assessed                       General Comments General comments (skin integrity, edema, etc.): Conducted a vestibular assessment.  Answers to questioning led this therapist to feel that there is a peripheral cause of vertigo, but did not lead me to think BPPV.  But to rule out multiple factors, this therapist did occulomotor testing, which was negative.      Exercises        Assessment/Plan    PT Assessment Patient needs continued PT services  PT Diagnosis Difficulty walking;Other (comment) (vertigo)   PT Problem List Decreased activity tolerance;Other (comment) (vertigo)  PT Treatment Interventions Gait training;Other (comment) (vestibular check)   PT Goals (Current goals can be found in the Care Plan section) Acute Rehab PT Goals Patient Stated Goal: just get rid of this vertigo PT Goal Formulation: With patient Time For Goal Achievement: 07/16/14 Potential to Achieve Goals: Good    Frequency Min 2X/week   Barriers to discharge   is a caregiver    Co-evaluation               End of Session   Activity Tolerance: Other (comment) (limited by vertigo) Patient left: in bed;with call bell/phone within reach Nurse Communication: Mobility status    Functional Assessment Tool Used: clinical judgement Functional Limitation: Mobility: Walking and moving around Mobility: Walking and Moving Around Current Status (B0211): At least 20 percent but less than 40 percent impaired, limited or restricted Mobility: Walking and Moving Around Goal Status [phone]): At least 1 percent but less than 20 percent impaired, limited or restricted    Time: 8022-3361 PT Time Calculation (min) (ACUTE ONLY): 39 min   Charges:   PT Evaluation $Initial PT Evaluation Tier I: 1 Procedure PT Treatments $Neuromuscular Re-education: 8-22 mins $Canalith Rep Proc: 8-22 mins   PT G Codes:   Functional Assessment Tool Used: clinical judgement Functional Limitation: Mobility: Walking and moving around    [employer] 07/14/2014, 4:24 PM  07/14/2014  Donnella Sham, [place] [phone]  (pager)

## 2014-07-14 NOTE — Plan of Care (Signed)
Problem: Phase I Progression Outcomes Goal: Pain controlled with appropriate interventions Outcome: Not Applicable Date Met:  02/72/53  Problem: Discharge Progression Outcomes Goal: Pain controlled with appropriate interventions Outcome: Not Applicable Date Met:  66/44/03 Goal: Tolerating diet Outcome: Completed/Met Date Met:  07/14/14

## 2014-07-14 NOTE — Progress Notes (Signed)
[facility] PROGRESS NOTE  Chris Reyes YDX:[id number] DOB: [birth year] DOA: 07/13/2014 PCP: Gwendolyn Grant, MD  Assessment/Plan:   Dizziness/vertigo - Likely multifactorial- positional symptoms associated with tinnitus -Deneis any episodes since hospitalization - MRI brain- with no acute abnormalities -Orthostatic vitals positive  -Continue IV fluids -PT/OT vestibular eval pending -PT eval pending -Meclizine PRN  UTI -UA-with small leukocytes and moderate hgb. -WBC normal, febrile -Placed on IV Rocephin (day1) -Urine cxs pending  Fever -Likely due to UTI   -CXR- with no acute abnormality -Placed on IV Rocephin, tylenol PRN -Influenza PCR pending -Urine and blood cxs pending  Aortic Stenosis -2D echo 5 2015- EF 72-09%, grade 1 diastolic dysfunction.  Moderate LVH. Severe Aortic stenosis with mild to moderate aortic regurgitation.  Dilated aorta, and dilated left atrium.  2D echo 2009- with mild Aortic stenosis -Per Dr.  Ron Parker, patient is strongly encouraged to get aortic valve replaced.  Pt seen Dr.  Lucianne Lei for surgical consult and plans on proceeding with surgery in Jan 2016.   CHF -Appears euvolemic -2D echo 5 2015- EF 47-09%, grade 1 diastolic dysfunction.  Moderate LVH. Severe Aortic stenosis with mild to moderate aortic regurgitation -Not on diuretic at home  CAD -Denies chest pain -Continue ASA 81mg  daily, IMDUR 300mg  daily,   History of PE on Coumadin -Complains of some shortness of breath but 100% O2 sats on RA -Coumadin per pharmacy  Hyperlipidemia -Continue Lipitor 80 daily, Lovaza daily  HTN -BP stable -Continue Losartan 50 mg daily  Obesity -BMI 34.4  Tobacco Abuse -counseled on cessation    DVT Prophylaxis:  Coumadin per pharmacy for hx of PE Code Status: Full Family Communication: No family at bedside Disposition Plan: Home when stable   Consultants:  None  Procedures:  None  Antibiotics:  Ceftriaxone  (day2)  HPI/Subjective: Aortic Stenosis (severe on echo 2014, can not rule out bicuspid aortic valve; moderate severe on echo 2012, and mild on echo 2009); CAD s/p cath 2009; Hs of PE on chronic coumadin; Carotid artery disease (0-39% on Doppler 2012); COPD, dyslipidemia; PAD; Hx of GI bleed; tobacco abuse; and obesity that presents with dizziness the started the past day. He states he feels the room pinning around him when he tries to move. Symptoms last only briefly, and are associated with high pitch sound in his ears and weakness of his extremities.  He also has headaches that he attributes to his sinuses.  He states the dizziness started back in August, at which time he was admitted and workup was only positive for orthostasis. He admits to having fever and chills.  He denies any fall, loss of consciousness, seizure activity, loss of appetite, unintended weight loss, night sweats, chest pain, palpitations, nausea, or vomiting.   He states that he will undergo aortic valve repair surgery after the holidays in January 2016. In ED, MRI head and CXR are negative for acute process.  UA with evidence of UTI.  He is placed on IV Rocephin and is admitted for further workup.      Objective: Filed Vitals:   07/14/14 0903  BP: 108/83  Pulse: 112  Temp:   Resp:     Intake/Output Summary (Last 24 hours) at 07/14/14 1010 Last data filed at 07/14/14 0903  Gross per 24 hour  Intake    300 ml  Output    600 ml  Net   -300 ml   Filed Weights   07/13/14 2213  Weight: 111.494 kg (245 lb 12.8 oz)  Exam:  Gen: Alert and oriented obese caucasian male in NAD.   HEENT: Normocephalic, atraumatic.  PERRLA, EOM intact. Pupils symmetrical.  Moist mucosa.   Chest: clear to auscultate bilaterally, no ronchi or rales  Cardiac: Regular rate and rhythm, loud systolic murmur. No rubs or gallops Abdomen: soft, non tender, non distended, +bowel sounds. No guarding or rigidity  Extremities: Symmetrical in  appearance without cyanosis or edema  Neurological: Alert awake oriented to time place and person. Normal ROM of all extremities.  5/5 of BLE and BUE strength Psychiatric: Appears normal.   Data Reviewed: Basic Metabolic Panel:  Recent Labs Lab 07/13/14 1635 07/14/14 0109  NA 134* 133*  K 4.4 3.8  CL 100 98  CO2  --  20  GLUCOSE 107* 104*  BUN 13 13  CREATININE 0.90 0.90  CALCIUM  --  8.6   Liver Function Tests:  Recent Labs Lab 07/14/14 0109  AST 26  ALT 25  ALKPHOS 53  BILITOT 0.9  PROT 7.0  ALBUMIN 3.1*   No results for input(s): LIPASE, AMYLASE in the last 168 hours. No results for input(s): AMMONIA in the last 168 hours. CBC:  Recent Labs Lab 07/13/14 1524 07/13/14 1635 07/14/14 0109  WBC 10.1  --  9.2  NEUTROABS 8.9*  --  7.3  HGB 18.6* 19.7* 17.2*  HCT 52.7* 58.0* 48.1  MCV 94.4  --  92.0  PLT 179  --  159   Cardiac Enzymes: No results for input(s): CKTOTAL, CKMB, CKMBINDEX, TROPONINI in the last 168 hours. BNP (last 3 results) No results for input(s): PROBNP in the last 8760 hours. CBG:  Recent Labs Lab 07/13/14 1528  GLUCAP 116*    No results found for this or any previous visit (from the past 240 hour(s)).   Studies: Dg Chest 2 View  07/13/2014   CLINICAL DATA:  Dizziness for 2 months now unable to walk, cough for 2 days, smoker, history COPD, hypertension, peripheral vascular disease, carotid artery disease, coronary artery disease, bradycardia, pulmonary embolism  EXAM: CHEST  2 VIEW  COMPARISON:  A 07/2014  FINDINGS: Enlargement of cardiac silhouette with pulmonary vascular congestion.  Mediastinal contours normal.  Lungs emphysematous but clear.  No pleural effusion or pneumothorax.  Multilevel endplate spur formation thoracic spine.  IMPRESSION: Enlargement of cardiac silhouette with pulmonary vascular congestion.  COPD changes without infiltrate.   Electronically Signed   By: Lavonia Dana M.D.   On: 07/13/2014 16:45   Mr Brain Wo  Contrast  07/13/2014   CLINICAL DATA:  Dizziness and generalized weakness.  EXAM: MRI HEAD WITHOUT CONTRAST  TECHNIQUE: Multiplanar, multiecho pulse sequences of the brain and surrounding structures were obtained without intravenous contrast.  COMPARISON:  MRI brain 04/12/2014  FINDINGS: The diffusion-weighted images demonstrate no evidence for acute or subacute infarction. Mild periventricular white matter changes bilaterally are similar to the prior study. Mild generalized atrophy is unchanged. The ventricles are proportionate to the degree of atrophy. Dilated perivascular spaces are again noted bilaterally. No significant extraaxial fluid collection is present.  Flow is present in the major intracranial arteries. The globes and orbits are intact. A midline structures are within normal limits. Small polyp or mucous retention cyst is present inferiorly in the right maxillary sinus. The remaining paranasal sinuses and the mastoid air cells are clear.  IMPRESSION: 1. No acute intracranial abnormality or significant interval change. 2. Mild atrophy and white matter disease is stable since the prior exam. 3. Polyps or mucous retention cysts at  the floor of the right maxillary sinus are stable as well.   Electronically Signed   By: Lawrence Santiago M.D.   On: 07/13/2014 18:50    Scheduled Meds: . aspirin EC  81 mg Oral Daily  . atorvastatin  80 mg Oral Daily  . [START ON 07/15/2014] cefTRIAXone (ROCEPHIN)  IV  1 g Intravenous Q24H  . fluticasone  1 spray Each Nare Daily  . isosorbide mononitrate  30 mg Oral Daily  . losartan  50 mg Oral Daily  . omega-3 acid ethyl esters  1 g Oral Daily  . sodium chloride  3 mL Intravenous Q12H  . [START ON 07/15/2014] warfarin  2.5 mg Oral Once per day on Sun Mon Wed Fri Sat  . warfarin  5 mg Oral Once per day on Tue Thu  . Warfarin - Pharmacist Dosing Inpatient   Does not apply q1800   Continuous Infusions:   Principal Problem:   Vertigo Active Problems:    Essential hypertension   CAD (coronary artery disease)   Aortic stenosis   Pulmonary emboli    Time spent: [place], [facility]  [facility] Pager [phone]. If 7PM-7AM, please contact night-coverage at [email or web address], password [facility] 07/14/2014, 10:10 AM  LOS: 1 day

## 2014-07-14 NOTE — Progress Notes (Signed)
ANTICOAGULATION CONSULT NOTE  Pharmacy Consult for coumadin Indication: DVT  No Known Allergies  Patient Measurements: Height: 5\' 11"  (180.3 cm) Weight: 245 lb 12.8 oz (111.494 kg) IBW/kg (Calculated) : 75.3   Vital Signs: Temp: 99.1 F (37.3 C) (11/12 0722) Temp Source: Oral (11/12 0722) BP: 132/67 mmHg (11/12 0722) Pulse Rate: 82 (11/12 0722)  Labs:  Recent Labs  07/13/14 1524 07/13/14 1635 07/14/14 0109  HGB 18.6* 19.7* 17.2*  HCT 52.7* 58.0* 48.1  PLT 179  --  159  LABPROT 23.9*  --   --   INR 2.12*  --   --   CREATININE  --  0.90 0.90    Estimated Creatinine Clearance: 105.3 mL/min (by C-G formula based on Cr of 0.9).   Medical History: Past Medical History  Diagnosis Date  . Hypertension   . Hernia   . COPD (chronic obstructive pulmonary disease)   . CAD (coronary artery disease)     Catheterization, [place], July, 2006, EF 45%,  50% LAD, 50% RCA, 90% marginal, possible collaterals from LAD  to a branch of the PDA, medical therapy recommended  . GI bleed     2006, no significant abnormalities per the patient  . PAD (peripheral artery disease)     Arterial leg Dopplers, April, 2014, normal  . Ejection fraction     EF 45%, catheterization, 2006  //   EF 55%, echo, March, 2012, wall abnormality at the base of the inferior wall  . Aortic stenosis     severe echo 01/2013- cannot rule out bicuspid aortic valve  //   moderately severe, echo,  March, 2012 ( progression since 2009)  . RBBB   . Pulmonary emboli 2007    Significant, 2007, Coumadin therapy  . Warfarin anticoagulation     History pulmonary emboli  . Bradycardia   . Tobacco abuse   . Carotid artery disease     Doppler, March, 2012,  0-39% bilateral  . Dyslipidemia   . Heart murmur   . History of hiatal hernia   . GERD (gastroesophageal reflux disease)   . Sinus headache   . Migraine     "not very often at all" (07/13/2014)  . Chronic lower back pain     Medications:  Prescriptions  prior to admission  Medication Sig Dispense Refill Last Dose  . acetaminophen (TYLENOL) 500 MG tablet Take 1,500 mg by mouth every 6 (six) hours as needed (pain).   07/12/2014 at Unknown time  . aspirin EC 81 MG EC tablet Take 1 tablet (81 mg total) by mouth daily.   07/12/2014 at 8a  . atorvastatin (LIPITOR) 80 MG tablet Take 1 tablet (80 mg total) by mouth daily. 30 tablet 11 07/12/2014 at Unknown time  . Coenzyme Q10 (COQ10) 100 MG CAPS Take 1 capsule by mouth daily.    07/12/2014 at Unknown time  . fish oil-omega-3 fatty acids 1000 MG capsule Take 1 g by mouth daily.   07/12/2014 at Unknown time  . fluticasone (FLONASE) 50 MCG/ACT nasal spray Place 1 spray into both nostrils daily. 16 g 2 07/13/2014 at Unknown time  . ibuprofen (ADVIL,MOTRIN) 200 MG tablet Take 600 mg by mouth every 6 (six) hours as needed (pain).   Past Week at Unknown time  . isosorbide mononitrate (IMDUR) 30 MG 24 hr tablet Take 30 mg by mouth daily.   07/13/2014 at Unknown time  . losartan (COZAAR) 50 MG tablet Take 1 tablet (50 mg total) by mouth daily. 90 tablet  3 07/13/2014 at Unknown time  . warfarin (COUMADIN) 5 MG tablet Take 2.5-5 mg by mouth daily. Takes 5mg  on Tues and Thursdays and half a tab 2.5 mg Mon. Wed, Fri, Sat, Sun   07/13/2014 at 12p    Assessment: 63 yo man admitted with vertigo. Pt is on warfarin for h/o DVT/PE.  His INR is therapeutic on home regimen of coumadin. INR 2.1 last evening.  PTA dose: 5 mg on Tues/Thurs, 2.5 on all other days  Goal of Therapy:  INR 2-3 Monitor platelets by anticoagulation protocol: Yes   Plan:  Continue coumadin home regimen Daily PT/INR   Hughes Better, PharmD, BCPS Clinical Pharmacist Pager: [phone] 07/14/2014 8:52 AM '

## 2014-07-15 LAB — COMPREHENSIVE METABOLIC PANEL
ALBUMIN: 2.9 g/dL — AB (ref 3.5–5.2)
ALT: 28 U/L (ref 0–53)
ANION GAP: 13 (ref 5–15)
AST: 30 U/L (ref 0–37)
Alkaline Phosphatase: 51 U/L (ref 39–117)
BILIRUBIN TOTAL: 0.6 mg/dL (ref 0.3–1.2)
BUN: 13 mg/dL (ref 6–23)
CO2: 22 mEq/L (ref 19–32)
CREATININE: 0.8 mg/dL (ref 0.50–1.35)
Calcium: 8.4 mg/dL (ref 8.4–10.5)
Chloride: 97 mEq/L (ref 96–112)
GFR calc Af Amer: >90 mL/min (ref >90)
GFR calc non Af Amer: >90 mL/min (ref >90)
Glucose, Bld: 107 mg/dL — ABNORMAL HIGH (ref 70–99)
Potassium: 3.9 mEq/L (ref 3.7–5.3)
Sodium: 132 mEq/L — ABNORMAL LOW (ref 137–147)
TOTAL PROTEIN: 6.7 g/dL (ref 6.0–8.3)

## 2014-07-15 LAB — PROTIME-INR
INR: 1.86 — AB (ref 0.00–1.49)
PROTHROMBIN TIME: 21.6 s — AB (ref 11.6–15.2)

## 2014-07-15 MED ORDER — MECLIZINE HCL 25 MG PO TABS
25.0000 mg | ORAL_TABLET | Freq: Three times a day (TID) | ORAL | Status: DC
  Administered 2014-07-15 – 2014-07-17 (×7): 25 mg via ORAL
  Filled 2014-07-15 (×9): qty 1

## 2014-07-15 MED ORDER — ISOSORBIDE MONONITRATE 15 MG HALF TABLET
15.0000 mg | ORAL_TABLET | Freq: Every day | ORAL | Status: DC
  Administered 2014-07-15 – 2014-07-17 (×3): 15 mg via ORAL
  Filled 2014-07-15 (×3): qty 1

## 2014-07-15 MED ORDER — CEFTRIAXONE SODIUM IN DEXTROSE 40 MG/ML IV SOLN: 2.0000 g | INTRAVENOUS | Status: DC

## 2014-07-15 MED ORDER — WARFARIN SODIUM 2.5 MG PO TABS
2.5000 mg | ORAL_TABLET | ORAL | Status: DC
  Administered 2014-07-15 – 2014-07-16 (×2): 2.5 mg via ORAL
  Filled 2014-07-15 (×3): qty 1

## 2014-07-15 MED ORDER — WARFARIN SODIUM 2.5 MG PO TABS
2.5000 mg | ORAL_TABLET | Freq: Once | ORAL | Status: AC
  Administered 2014-07-15: 2.5 mg via ORAL
  Filled 2014-07-15: qty 1

## 2014-07-15 NOTE — Progress Notes (Deleted)
63 yo man admitted with vertigo to continue coumadin for h/o DVT/PE. His INR is therapeutic on home regimen of coumadin.  Anticoagulation: INR 1.86, on warfarin for hx of DVT/PE PTA dose: Takes 5mg  on Tues and Thursdays and half a tab 2.5 mg Mon. Wed, Fri, Sat, Sun   Infectious Disease: Blood has now grown out GNR. Afebrile now, wbc 9.2  11/12 ceftriaxone >  11/12 blood>>1/2 GNR  Cardiovascular: VSS, HTN, CAD, asa, lipitor, imdur, losartan, lovaza Endocrinology: cbg < 120, no history GI / Nutrition: LFTs wnl, Tbili wnl Neurology: A&O Nephrology: SCr <1, lytes wnl Pulmonary: RA Hematology / Oncology: hgb 17.2, plts wnl PTA Medication Issues: addressed Best Practices: warfarin  Plan  Warfarin 5mg  x1 today then home dose Daily INR Change ceftriaxone to 2g IV q24  Onnie Boer, PharmD Pager: [phone] 07/15/2014 10:02 AM

## 2014-07-15 NOTE — Progress Notes (Signed)
Chris Reyes IDP:[id number] DOB: 1950-11-20 DOA: 07/13/2014 PCP: Gwendolyn Grant, MD  Brief narrative: 13 ? Severe AoS, CAd s/p Cath 2009/2006, PE on Coumadin 01/14/11/2007, Chronic Carotid disease 12/12, COPD, HLD, PAD, GIB 2006 admitted with subacute onset dizzyness for abotu 2-3 weeks which culminated in worsening symptoms and came to ED for eval thought to have Pyelo but no symptoms on IVF  Past medical history-As per Problem list Chart reviewed as below- reviewed  Consultants:  none  Procedures:  none  Antibiotics:  ceftriaxone   Subjective  Still feels dizzy on sitting up and woozy and feels as if his falling crit Some discomfort in the right axillary no nausea no vomiting no chest pain    Objective    Interim History: reviewed  Telemetry: sinus   Objective: Filed Vitals:   07/14/14 0903 07/14/14 1402 07/14/14 2050 07/15/14 0539  BP: 108/83 98/45 119/75 121/76  Pulse: 112 89 69 71  Temp:  99.3 F (37.4 C) 98.9 F (37.2 C) 99.5 F (37.5 C)  TempSrc:  Oral Oral Oral  Resp:   20 20  Height:      Weight:      SpO2:  97% 97% 96%    Intake/Output Summary (Last 24 hours) at 07/15/14 1025 Last data filed at 07/15/14 0917  Gross per 24 hour  Intake    543 ml  Output    525 ml  Net     18 ml    Exam:  General: alert pleasant oriented no apparent distress. Left ear seems a little red and swollen compared to the right however no air-fluid level Cardiovascular: S1-S2 blowing murmur early systolic best heard left second intercostal space Respiratory: clinically clear Abdomen: soft nontender nondistended Skinno lower extremity edema Neurointact  Data Reviewed: Basic Metabolic Panel:  Recent Labs Lab 07/13/14 1635 07/14/14 0109 07/15/14 0547  NA 134* 133* 132*  K 4.4 3.8 3.9  CL 100 98 97  CO2  --  20 22  GLUCOSE 107* 104* 107*  BUN 13 13 13   CREATININE 0.90 0.90 0.80  CALCIUM  --  8.6 8.4   Liver Function Tests:  Recent Labs Lab  07/14/14 0109 07/15/14 0547  AST 26 30  ALT 25 28  ALKPHOS 53 51  BILITOT 0.9 0.6  PROT 7.0 6.7  ALBUMIN 3.1* 2.9*   No results for input(s): LIPASE, AMYLASE in the last 168 hours. No results for input(s): AMMONIA in the last 168 hours. CBC:  Recent Labs Lab 07/13/14 1524 07/13/14 1635 07/14/14 0109  WBC 10.1  --  9.2  NEUTROABS 8.9*  --  7.3  HGB 18.6* 19.7* 17.2*  HCT 52.7* 58.0* 48.1  MCV 94.4  --  92.0  PLT 179  --  159   Cardiac Enzymes: No results for input(s): CKTOTAL, CKMB, CKMBINDEX, TROPONINI in the last 168 hours. BNP: Invalid input(s): POCBNP CBG:  Recent Labs Lab 07/13/14 1528  GLUCAP 116*    Recent Results (from the past 240 hour(s))  Urine culture     Status: None   Collection Time: 07/13/14  3:20 PM  Result Value Ref Range Status   Specimen Description URINE, RANDOM  Final   Special Requests NONE  Final   Culture  Setup Time   Final    07/13/2014 16:23 Performed at [place] Performed at [facility]   Final   Culture NO GROWTH Performed at [facility]   Final  Report Status 07/14/2014 FINAL  Final  Culture, blood (routine x 2)     Status: None (Preliminary result)   Collection Time: 07/14/14  1:09 AM  Result Value Ref Range Status   Specimen Description BLOOD RIGHT ARM  Final   Special Requests   Final    BOTTLES DRAWN AEROBIC AND ANAEROBIC BLUE 10CC RED 3CC   Culture  Setup Time   Final    07/14/2014 09:03 Performed at [facility]    Culture   Final    GRAM NEGATIVE RODS Note: Gram Stain Report Called to,Read Back By and Verified With: LATOYA FOOTE ON 07/15/2014 AT 12:05A BY WILEJ Performed at [facility]    Report Status PENDING  Incomplete  Culture, blood (routine x 2)     Status: None (Preliminary result)   Collection Time: 07/14/14  1:14 AM  Result Value Ref Range Status   Specimen Description BLOOD LEFT HAND  Final   Special Requests BOTTLES DRAWN  AEROBIC ONLY 10CC  Final   Culture  Setup Time   Final    07/14/2014 09:03 Performed at [facility]    Culture   Final           BLOOD CULTURE RECEIVED NO GROWTH TO DATE CULTURE WILL BE HELD FOR 5 DAYS BEFORE ISSUING A FINAL NEGATIVE REPORT Performed at [facility]    Report Status PENDING  Incomplete     Studies:              All Imaging reviewed and is as per above notation   Scheduled Meds: . aspirin EC  81 mg Oral Daily  . atorvastatin  80 mg Oral Daily  . [START ON 07/16/2014] cefTRIAXone (ROCEPHIN)  IV  2 g Intravenous Q24H  . fluticasone  1 spray Each Nare Daily  . isosorbide mononitrate  30 mg Oral Daily  . losartan  50 mg Oral Daily  . omega-3 acid ethyl esters  1 g Oral Daily  . sodium chloride  3 mL Intravenous Q12H  . warfarin  2.5 mg Oral Once per day on Sun Mon Wed Fri Sat  . warfarin  2.5 mg Oral ONCE-1800  . warfarin  5 mg Oral Once per day on Tue Thu  . Warfarin - Pharmacist Dosing Inpatient   Does not apply q1800   Continuous Infusions:    Assessment/Plan: 1. Vertigo-probably benign positional versus labyrinthitis-schedule meclizine to see if this improves things. If not we'll consult cardiology as this may be his aortic stenosis and becoming more symptomatic.  I will cut back his dose of imdurFrom 30-15 mg 2. Severe aortic stenosis-see above discussion.  Force fluids orally 3.  unlikely pyelonephritis-discontinue ceftriaxone 4. Hypertension continue Imdur 15, losartan 50 daily 5. Recurrent PEs-continue chronic Coumadin 6. Prior GI bleed 2006 7. CAD status post cath 2006, 2009-continue aspirin 81 mg 8. Impaired glucose tolerance-outpatient testing and decision about medication then  Code Status: full Family Communication: none at bedside inpatient Disposition Plan: inpt   Verneita Griffes, MD  [facility] Pager [phone] 07/15/2014, 10:25 AM    LOS: 2 days

## 2014-07-15 NOTE — Progress Notes (Addendum)
ANTICOAGULATION CONSULT NOTE  Pharmacy Consult for coumadin Indication: DVT  No Known Allergies  Patient Measurements: Height: 5\' 11"  (180.3 cm) Weight: 245 lb 12.8 oz (111.494 kg) IBW/kg (Calculated) : 75.3   Vital Signs: Temp: 99.5 F (37.5 C) (11/13 0539) Temp Source: Oral (11/13 0539) BP: 121/76 mmHg (11/13 0539) Pulse Rate: 71 (11/13 0539)  Labs:  Recent Labs  07/13/14 1524 07/13/14 1635 07/14/14 0109 07/15/14 0547  HGB 18.6* 19.7* 17.2*  --   HCT 52.7* 58.0* 48.1  --   PLT 179  --  159  --   LABPROT 23.9*  --   --  21.6*  INR 2.12*  --   --  1.86*  CREATININE  --  0.90 0.90 0.80    Estimated Creatinine Clearance: 118.5 mL/min (by C-G formula based on Cr of 0.8).   Medical History: Past Medical History  Diagnosis Date  . Hypertension   . Hernia   . COPD (chronic obstructive pulmonary disease)   . CAD (coronary artery disease)     Catheterization, [place], July, 2006, EF 45%,  50% LAD, 50% RCA, 90% marginal, possible collaterals from LAD  to a branch of the PDA, medical therapy recommended  . GI bleed     2006, no significant abnormalities per the patient  . PAD (peripheral artery disease)     Arterial leg Dopplers, April, 2014, normal  . Ejection fraction     EF 45%, catheterization, 2006  //   EF 55%, echo, March, 2012, wall abnormality at the base of the inferior wall  . Aortic stenosis     severe echo 01/2013- cannot rule out bicuspid aortic valve  //   moderately severe, echo,  March, 2012 ( progression since 2009)  . RBBB   . Pulmonary emboli 2007    Significant, 2007, Coumadin therapy  . Warfarin anticoagulation     History pulmonary emboli  . Bradycardia   . Tobacco abuse   . Carotid artery disease     Doppler, March, 2012,  0-39% bilateral  . Dyslipidemia   . Heart murmur   . History of hiatal hernia   . GERD (gastroesophageal reflux disease)   . Sinus headache   . Migraine     "not very often at all" (07/13/2014)  . Chronic lower  back pain     Medications:  Prescriptions prior to admission  Medication Sig Dispense Refill Last Dose  . acetaminophen (TYLENOL) 500 MG tablet Take 1,500 mg by mouth every 6 (six) hours as needed (pain).   07/12/2014 at Unknown time  . aspirin EC 81 MG EC tablet Take 1 tablet (81 mg total) by mouth daily.   07/12/2014 at 8a  . atorvastatin (LIPITOR) 80 MG tablet Take 1 tablet (80 mg total) by mouth daily. 30 tablet 11 07/12/2014 at Unknown time  . Coenzyme Q10 (COQ10) 100 MG CAPS Take 1 capsule by mouth daily.    07/12/2014 at Unknown time  . fish oil-omega-3 fatty acids 1000 MG capsule Take 1 g by mouth daily.   07/12/2014 at Unknown time  . fluticasone (FLONASE) 50 MCG/ACT nasal spray Place 1 spray into both nostrils daily. 16 g 2 07/13/2014 at Unknown time  . ibuprofen (ADVIL,MOTRIN) 200 MG tablet Take 600 mg by mouth every 6 (six) hours as needed (pain).   Past Week at Unknown time  . isosorbide mononitrate (IMDUR) 30 MG 24 hr tablet Take 30 mg by mouth daily.   07/13/2014 at Unknown time  . losartan (COZAAR)  50 MG tablet Take 1 tablet (50 mg total) by mouth daily. 90 tablet 3 07/13/2014 at Unknown time  . warfarin (COUMADIN) 5 MG tablet Take 2.5-5 mg by mouth daily. Takes 5mg  on Tues and Thursdays and half a tab 2.5 mg Mon. Wed, Fri, Sat, Sun   07/13/2014 at 12p    Assessment: 63 yo man admitted with vertigo. Pt is on warfarin for h/o DVT/PE.  His INR is therapeutic on home regimen of coumadin. INR down to 1.86 this AM. Will repeat the 5mg  again today.   PTA dose: 5 mg on Tues/Thurs, 2.5 on all other days  Goal of Therapy:  INR 2-3 Monitor platelets by anticoagulation protocol: Yes   Plan:   Coumadin 5mg  PO x1 Cont daily INR Change rocephin to 2g IV q24 due to bacteremia  Onnie Boer, PharmD Pager: [phone] 07/15/2014 9:57 AM

## 2014-07-15 NOTE — Progress Notes (Signed)
UR completed 

## 2014-07-15 NOTE — Progress Notes (Signed)
Physical Therapy Treatment Patient Details Name: Chris Reyes MRN: [id number] DOB: May 04, 1951 Today's Date: 07/15/2014    History of Present Illness Chris Reyes is a 63 y.o. male with history of CAD, aortic stenosis, pulmonary embolism on Coumadin, hyperlipidemia started experiencing dizziness since last night. Patient states that he was feeling things spinning around whenever he tries to move. Denies any nausea vomiting loss of consciousness. Patient has been having some ringing sound in his ear. Also has been recently having some headaches. Since patient's symptoms persisted patient came to the ER and MRI brain done was negative for anything acute. Patient was finding it difficult to ambulate because of persistent dizziness particularly on motion. Patient also was found to be mildly febrile. UA showing features of UTI. Patient also had complained of nonproductive cough for last few days but chest x-ray was showing nothing acute.    PT Comments    Pt much safer with mobility this pm post administering meclizine vs spontaneous recovery.  He is still not at baseline function and safety.  Follow Up Recommendations  No PT follow up     Equipment Recommendations  None recommended by PT    Recommendations for Other Services       Precautions / Restrictions Precautions Precautions: Fall    Mobility  Bed Mobility   Bed Mobility: Sidelying to Sit;Sit to Supine   Sidelying to sit: Modified independent (Device/Increase time)   Sit to supine: Modified independent (Device/Increase time)      Transfers Overall transfer level: Needs assistance   Transfers: Sit to/from Stand Sit to Stand: Supervision         General transfer comment: steady  Ambulation/Gait Ambulation/Gait assistance: Supervision Ambulation Distance (Feet): 350 Feet Assistive device: None Gait Pattern/deviations: Step-through pattern Gait velocity: slow, but able to increase speed notably   General Gait  Details: much more steady and able to maintain balance with challenges including turns, backing up, scanning and stepping over objects. without overt LOB   Stairs Stairs: Yes Stairs assistance: Min guard Stair Management: One rail Right;Alternating pattern;Forwards Number of Stairs: 5 General stair comments: steady with rail  Wheelchair Mobility    Modified Rankin (Stroke Patients Only)       Balance Overall balance assessment: Needs assistance Sitting-balance support: No upper extremity supported Sitting balance-Leahy Scale: Fair     Standing balance support: No upper extremity supported Standing balance-Leahy Scale: Fair                      Cognition Arousal/Alertness: Awake/alert Behavior During Therapy: WFL for tasks assessed/performed Overall Cognitive Status: Within Functional Limits for tasks assessed                      Exercises      General Comments General comments (skin integrity, edema, etc.): Meclizine was trialed to see if it had a positive effect on pt's vertigo.  Pt is better this pm after meclizine given.  So will continue the medicine.      Pertinent Vitals/Pain Pain Assessment: No/denies pain    Home Living                      Prior Function            PT Goals (current goals can now be found in the care plan section) Acute Rehab PT Goals Patient Stated Goal: just get rid of this vertigo PT Goal Formulation: With patient Time For  Goal Achievement: 07/16/14 Potential to Achieve Goals: Good Progress towards PT goals: Progressing toward goals    Frequency  Min 2X/week    PT Plan Current plan remains appropriate    Co-evaluation             End of Session   Activity Tolerance: Patient tolerated treatment well Patient left: in bed;with call bell/phone within reach (sitting EOB)     Time: 2119-4174 PT Time Calculation (min) (ACUTE ONLY): 25 min  Charges:  $Gait Training: 8-22 mins $Therapeutic  Activity: 8-22 mins                    G Codes:      Chris Reyes, Chris Reyes 07/15/2014, 5:24 PM 07/15/2014  Chris Reyes, PT [phone]  (pager)

## 2014-07-15 NOTE — Care Management Note (Signed)
    Page 1 of 2   07/17/2014     3:03:26 PM CARE MANAGEMENT NOTE 07/17/2014  Patient:  Chris Reyes, Chris Reyes   Account Number:  [id number]  Date Initiated:  07/15/2014  Documentation initiated by:  McNally,Kristin  Subjective/Objective Assessment:   pt admit with dizziness     Action/Plan:   from home with spouse & anticipated dc home   Anticipated DC Date:  07/16/2014   Anticipated DC Plan:  [place]  CM consult      [facility] Choice  HOME HEALTH   Choice offered to / List presented to:  C-1 Patient   DME arranged  NA      DME agency  NA     [place] arranged  HH-2 PT      [place].   Status of service:  Completed, signed off Medicare Important Message given?   (If response is "NO", the following Medicare IM given date fields will be blank) Date Medicare IM given:   Medicare IM given by:   Date Additional Medicare IM given:   Additional Medicare IM given by:    Discharge Disposition:  [place]  Per UR Regulation:    If discussed at Long Length of Stay Meetings, dates discussed:    Comments:  07/18/15 11:32 Cm met with pt in room to offer choice of home health agency.  Pt chooses [facility] to render HHPT.  Pt states he does not need any DME.  Address and contact information verified with pt.  Referral called to [facility] rep, jamoie for HHPT.  No other CM needs were communicated. Mariane Masters, BSN, [place].  07/15/14 kristin mcnally Pt admit from home with dizziness. Seen by PT and no PT follow up recommended. anticipated dc home with no needs

## 2014-07-16 LAB — CBC WITH DIFFERENTIAL/PLATELET
Basophils Absolute: 0 10*3/uL (ref 0.0–0.1)
Basophils Relative: 0 % (ref 0–1)
Eosinophils Absolute: 0.1 10*3/uL (ref 0.0–0.7)
Eosinophils Relative: 1 % (ref 0–5)
HCT: 47 % (ref 39.0–52.0)
HEMOGLOBIN: 16.4 g/dL (ref 13.0–17.0)
LYMPHS ABS: 1.9 10*3/uL (ref 0.7–4.0)
LYMPHS PCT: 22 % (ref 12–46)
MCH: 32.8 pg (ref 26.0–34.0)
MCHC: 34.9 g/dL (ref 30.0–36.0)
MCV: 94 fL (ref 78.0–100.0)
MONOS PCT: 18 % — AB (ref 3–12)
Monocytes Absolute: 1.5 10*3/uL — ABNORMAL HIGH (ref 0.1–1.0)
NEUTROS ABS: 5.1 10*3/uL (ref 1.7–7.7)
Neutrophils Relative %: 59 % (ref 43–77)
PLATELETS: 161 10*3/uL (ref 150–400)
RBC: 5 MIL/uL (ref 4.22–5.81)
RDW: 13.8 % (ref 11.5–15.5)
WBC: 8.6 10*3/uL (ref 4.0–10.5)

## 2014-07-16 LAB — PROTIME-INR
INR: 2.1 — AB (ref 0.00–1.49)
PROTHROMBIN TIME: 23.8 s — AB (ref 11.6–15.2)

## 2014-07-16 MED ORDER — CEFUROXIME AXETIL 500 MG PO TABS
500.0000 mg | ORAL_TABLET | Freq: Two times a day (BID) | ORAL | Status: DC
  Administered 2014-07-16 – 2014-07-17 (×2): 500 mg via ORAL
  Filled 2014-07-16 (×4): qty 1

## 2014-07-16 NOTE — Plan of Care (Signed)
Problem: Phase III Progression Outcomes Goal: Pain controlled on oral analgesia Outcome: Progressing     

## 2014-07-16 NOTE — Progress Notes (Signed)
Physical Therapy Treatment Patient Details Name: Chris Reyes MRN: [id number] DOB: 1950/10/28 Today's Date: 07/16/2014    History of Present Illness Chris Reyes is a 63 y.o. male with history of CAD, aortic stenosis, pulmonary embolism on Coumadin, hyperlipidemia started experiencing dizziness since last night. Patient states that he was feeling things spinning around whenever he tries to move. Denies any nausea vomiting loss of consciousness. Patient has been having some ringing sound in his ear. Also has been recently having some headaches. Since patient's symptoms persisted patient came to the ER and MRI brain done was negative for anything acute. Patient was finding it difficult to ambulate because of persistent dizziness particularly on motion. Patient also was found to be mildly febrile. UA showing features of UTI. Patient also had complained of nonproductive cough for last few days but chest x-ray was showing nothing acute.    PT Comments    Patient continues to feel dizzy with position changes.  Noted positive Dix-Hallpike for Rt posterior canal BPPV.  Treated with Epley canalith repositioning maneuver.  Patient reports subjective improvement of dizziness following session.  Recommend patient have f/u HHPT for Vestibular Rehab to continue to evaluate/treat his dizziness.   Follow Up Recommendations  Supervision/Assistance - 24 hour;  Home health PT (for Vestibular Rehab)      Equipment Recommendations  None recommended by PT    Recommendations for Other Services       Precautions / Restrictions Precautions Precautions: Fall Precaution Comments: Vertigo Restrictions Weight Bearing Restrictions: No    Mobility  Bed Mobility Overal bed mobility: Modified Independent Bed Mobility: Rolling;Sidelying to Sit;Sit to Sidelying Rolling: Modified independent (Device/Increase time) Sidelying to sit: Modified independent (Device/Increase time)     Sit to sidelying: Modified  independent (Device/Increase time) General bed mobility comments: Use of bed rail.  Dizziness noted especially with sidelying <> sit.  Especially from Lt sidelying to sitting following Epley maneuver.  Patient losing balance posteriorly in sitting, with dizziness 8/10.  Transfers Overall transfer level: Needs assistance Equipment used: None Transfers: Sit to/from Stand Sit to Stand: Modified independent (Device/Increase time)         General transfer comment: No loss of balance.  Using safe technique.  Ambulation/Gait Ambulation/Gait assistance: Supervision Ambulation Distance (Feet): 180 Feet Assistive device: None Gait Pattern/deviations: Step-through pattern;Decreased stride length Gait velocity: Decreased Gait velocity interpretation: Below normal speed for age/gender General Gait Details: Patient reports no change in dizziness/vertigo with gait.  No loss of balance with gait.   Stairs            Wheelchair Mobility    Modified Rankin (Stroke Patients Only)       Balance                                    Cognition Arousal/Alertness: Awake/alert Behavior During Therapy: WFL for tasks assessed/performed Overall Cognitive Status: Within Functional Limits for tasks assessed                      Vestibular Rehab *Oculomotor:  Smooth pursuits and saccades tested normal. *Head thrust: Tested normal. *Roll Test:  Tested normal to both sides. *Dix-Hallpike:  Dizziness noted to right side - no nystagmus noted.  However patient on meclizine.  Treated with Epley canalith repositioning maneuver.  With return to sitting, patient with increase in dizziness, and decreased sitting balance, with posterior loss of balance.  Resolved  over time.    General Comments        Pertinent Vitals/Pain Pain Assessment: No/denies pain    Home Living                      Prior Function            PT Goals (current goals can now be found in the  care plan section) Progress towards PT goals: Progressing toward goals    Frequency  Min 3X/week    PT Plan Discharge plan needs to be updated;Frequency needs to be updated    Co-evaluation             End of Session   Activity Tolerance: Patient tolerated treatment well Patient left: in bed;with call bell/phone within reach     Time: 1441-1524 PT Time Calculation (min) (ACUTE ONLY): 43 min  Charges:  $Gait Training: 8-22 mins $Therapeutic Activity: 8-22 mins $Canalith Rep Proc: 8-22 mins                    G Codes:      Despina Pole July 24, 2014, 3:42 PM Carita Pian. Sanjuana Kava, [place] Pager [phone]

## 2014-07-16 NOTE — Progress Notes (Signed)
UR completed 

## 2014-07-16 NOTE — Progress Notes (Signed)
Chris Reyes GGE:[id number] DOB: Mar 19, 1951 DOA: 07/13/2014 PCP: Gwendolyn Grant, MD  Brief narrative: 5 ? Severe AoS, CAd s/p Cath 2009/2006, PE on Coumadin 01/14/11/2007, Chronic Carotid disease 12/12, COPD, HLD, PAD, GIB 2006 admitted with subacute onset dizzyness for abotu 2-3 weeks which culminated in worsening symptoms and came to ED for eval thought to have Pyelo but no symptoms on IVF  Past medical history-As per Problem list Chart reviewed as below- reviewed  Consultants:  none  Procedures:  none  Antibiotics:  ceftriaxone   Subjective   Patient frustrated and wants to know what is the diagnosis He states he still has dizziness when he sits up and is mainly positional He has no shortness of breath chest pain Is tolerating a diet He has no nausea vomiting     Objective    Interim History: reviewed  Telemetry: sinus   Objective: Filed Vitals:   07/14/14 2050 07/15/14 0539 07/15/14 2249 07/16/14 0513  BP: 119/75 121/76 124/72 110/53  Pulse: 69 71 67 57  Temp: 98.9 F (37.2 C) 99.5 F (37.5 C) 99 F (37.2 C) 98.2 F (36.8 C)  TempSrc: Oral Oral Oral Oral  Resp: 20 20 18 18   Height:      Weight:      SpO2: 97% 96% 96% 98%    Intake/Output Summary (Last 24 hours) at 07/16/14 1243 Last data filed at 07/16/14 0825  Gross per 24 hour  Intake    360 ml  Output   1550 ml  Net  -1190 ml    Exam:  General: alert pleasant oriented no apparent distress. Left ear seems a little red and swollen compared to the right however no air-fluid level.  When patient is asked to track my finger in the midline he has reproduction of his vertiginous symptoms Cardiovascular: S1-S2 blowing murmur early systolic best heard left second intercostal space Respiratory: clinically clear Abdomen: soft nontender nondistended Skinno lower extremity edema Neurointact  Data Reviewed: Basic Metabolic Panel:  Recent Labs Lab 07/13/14 1635 07/14/14 0109  07/15/14 0547  NA 134* 133* 132*  K 4.4 3.8 3.9  CL 100 98 97  CO2  --  20 22  GLUCOSE 107* 104* 107*  BUN 13 13 13   CREATININE 0.90 0.90 0.80  CALCIUM  --  8.6 8.4   Liver Function Tests:  Recent Labs Lab 07/14/14 0109 07/15/14 0547  AST 26 30  ALT 25 28  ALKPHOS 53 51  BILITOT 0.9 0.6  PROT 7.0 6.7  ALBUMIN 3.1* 2.9*   No results for input(s): LIPASE, AMYLASE in the last 168 hours. No results for input(s): AMMONIA in the last 168 hours. CBC:  Recent Labs Lab 07/13/14 1524 07/13/14 1635 07/14/14 0109 07/16/14 0555  WBC 10.1  --  9.2 8.6  NEUTROABS 8.9*  --  7.3 5.1  HGB 18.6* 19.7* 17.2* 16.4  HCT 52.7* 58.0* 48.1 47.0  MCV 94.4  --  92.0 94.0  PLT 179  --  159 161   Cardiac Enzymes: No results for input(s): CKTOTAL, CKMB, CKMBINDEX, TROPONINI in the last 168 hours. BNP: Invalid input(s): POCBNP CBG:  Recent Labs Lab 07/13/14 1528  GLUCAP 116*    Recent Results (from the past 240 hour(s))  Urine culture     Status: None   Collection Time: 07/13/14  3:20 PM  Result Value Ref Range Status   Specimen Description URINE, RANDOM  Final   Special Requests NONE  Final   Culture  Setup Time  Final    07/13/2014 16:23 Performed at [place] Performed at [facility]   Final   Culture NO GROWTH Performed at [facility]   Final   Report Status 07/14/2014 FINAL  Final  Culture, blood (routine x 2)     Status: None (Preliminary result)   Collection Time: 07/14/14  1:09 AM  Result Value Ref Range Status   Specimen Description BLOOD RIGHT ARM  Final   Special Requests   Final    BOTTLES DRAWN AEROBIC AND ANAEROBIC BLUE 10CC RED 3CC   Culture  Setup Time   Final    07/14/2014 09:03 Performed at [facility]    Culture   Final    ESCHERICHIA COLI Note: Gram Stain Report Called to,Read Back By and Verified With: LATOYA FOOTE ON 07/15/2014 AT 12:05A BY WILEJ Performed at [facility]    Report Status PENDING  Incomplete  Culture, blood (routine x 2)     Status: None (Preliminary result)   Collection Time: 07/14/14  1:14 AM  Result Value Ref Range Status   Specimen Description BLOOD LEFT HAND  Final   Special Requests BOTTLES DRAWN AEROBIC ONLY 10CC  Final   Culture  Setup Time   Final    07/14/2014 09:03 Performed at [facility]    Culture   Final           BLOOD CULTURE RECEIVED NO GROWTH TO DATE CULTURE WILL BE HELD FOR 5 DAYS BEFORE ISSUING A FINAL NEGATIVE REPORT Performed at [facility]    Report Status PENDING  Incomplete     Studies:              All Imaging reviewed and is as per above notation   Scheduled Meds: . aspirin EC  81 mg Oral Daily  . atorvastatin  80 mg Oral Daily  . fluticasone  1 spray Each Nare Daily  . isosorbide mononitrate  15 mg Oral Daily  . losartan  50 mg Oral Daily  . meclizine  25 mg Oral TID  . omega-3 acid ethyl esters  1 g Oral Daily  . sodium chloride  3 mL Intravenous Q12H  . warfarin  2.5 mg Oral Once per day on Sun Mon Wed Fri Sat  . warfarin  5 mg Oral Once per day on Tue Thu  . Warfarin - Pharmacist Dosing Inpatient   Does not apply q1800   Continuous Infusions:    Assessment/Plan: 1. Vertigo-probably benign positional versus labyrinthitis-schedule meclizine 25 orally 3 times a day to see if this improves things. Await vestibular rehabilitation reassessment for attenuation of his vestibulocochlear reflex. Downward adjusted his dose of Imdur 2. Severe aortic stenosis-see above discussion.  Force fluids orally 3.  unlikely pyelonephritis-discontinue ceftriaxone 4. Hypertension continue Imdur 15, losartan 50 daily 5. Recurrent PEs-continue chronic Coumadin 6. Prior GI bleed 2006 7. CAD status post cath 2006, 2009-continue aspirin 81 mg 8. At the 15th atorvastatin 9. Impaired glucose tolerance-outpatient testing and decision about medication then  Code Status: full Family  Communication: none at bedside inpatient Disposition Plan: inpt -if his symptomatology improved next day he can be discharged home   Verneita Griffes, MD  [facility] Pager [phone] 07/16/2014, 12:43 PM    LOS: 3 days

## 2014-07-16 NOTE — Progress Notes (Signed)
ANTICOAGULATION CONSULT NOTE  Pharmacy Consult for coumadin Indication: DVT  No Known Allergies  Patient Measurements: Height: 5\' 11"  (180.3 cm) Weight: 245 lb 12.8 oz (111.494 kg) IBW/kg (Calculated) : 75.3  Vital Signs: Temp: 98.2 F (36.8 C) (11/14 0513) Temp Source: Oral (11/14 0513) BP: 110/53 mmHg (11/14 0513) Pulse Rate: 57 (11/14 0513)  Labs:  Recent Labs  07/13/14 1524 07/13/14 1635 07/14/14 0109 07/15/14 0547 07/16/14 0555  HGB 18.6* 19.7* 17.2*  --  16.4  HCT 52.7* 58.0* 48.1  --  47.0  PLT 179  --  159  --  161  LABPROT 23.9*  --   --  21.6* 23.8*  INR 2.12*  --   --  1.86* 2.10*  CREATININE  --  0.90 0.90 0.80  --    Estimated Creatinine Clearance: 118.5 mL/min (by C-G formula based on Cr of 0.8).  Medical History: Past Medical History  Diagnosis Date  . Hypertension   . Hernia   . COPD (chronic obstructive pulmonary disease)   . CAD (coronary artery disease)     Catheterization, [place], July, 2006, EF 45%,  50% LAD, 50% RCA, 90% marginal, possible collaterals from LAD  to a branch of the PDA, medical therapy recommended  . GI bleed     2006, no significant abnormalities per the patient  . PAD (peripheral artery disease)     Arterial leg Dopplers, April, 2014, normal  . Ejection fraction     EF 45%, catheterization, 2006  //   EF 55%, echo, March, 2012, wall abnormality at the base of the inferior wall  . Aortic stenosis     severe echo 01/2013- cannot rule out bicuspid aortic valve  //   moderately severe, echo,  March, 2012 ( progression since 2009)  . RBBB   . Pulmonary emboli 2007    Significant, 2007, Coumadin therapy  . Warfarin anticoagulation     History pulmonary emboli  . Bradycardia   . Tobacco abuse   . Carotid artery disease     Doppler, March, 2012,  0-39% bilateral  . Dyslipidemia   . Heart murmur   . History of hiatal hernia   . GERD (gastroesophageal reflux disease)   . Sinus headache   . Migraine     "not very  often at all" (07/13/2014)  . Chronic lower back pain    Medications:  Prescriptions prior to admission  Medication Sig Dispense Refill Last Dose  . acetaminophen (TYLENOL) 500 MG tablet Take 1,500 mg by mouth every 6 (six) hours as needed (pain).   07/12/2014 at Unknown time  . aspirin EC 81 MG EC tablet Take 1 tablet (81 mg total) by mouth daily.   07/12/2014 at 8a  . atorvastatin (LIPITOR) 80 MG tablet Take 1 tablet (80 mg total) by mouth daily. 30 tablet 11 07/12/2014 at Unknown time  . Coenzyme Q10 (COQ10) 100 MG CAPS Take 1 capsule by mouth daily.    07/12/2014 at Unknown time  . fish oil-omega-3 fatty acids 1000 MG capsule Take 1 g by mouth daily.   07/12/2014 at Unknown time  . fluticasone (FLONASE) 50 MCG/ACT nasal spray Place 1 spray into both nostrils daily. 16 g 2 07/13/2014 at Unknown time  . ibuprofen (ADVIL,MOTRIN) 200 MG tablet Take 600 mg by mouth every 6 (six) hours as needed (pain).   Past Week at Unknown time  . isosorbide mononitrate (IMDUR) 30 MG 24 hr tablet Take 30 mg by mouth daily.   07/13/2014 at  Unknown time  . losartan (COZAAR) 50 MG tablet Take 1 tablet (50 mg total) by mouth daily. 90 tablet 3 07/13/2014 at Unknown time  . warfarin (COUMADIN) 5 MG tablet Take 2.5-5 mg by mouth daily. Takes 5mg  on Tues and Thursdays and half a tab 2.5 mg Mon. Wed, Fri, Sat, Sun   07/13/2014 at 12p   Assessment: 63 yo man admitted with vertigo. Pt is on warfarin for h/o DVT/PE.  His INR is therapeutic on home regimen of coumadin. INR back within goal of 2.0-3.0 after repeating the 5mg  dose yesterday.   His CBC is stable and no noted bleeding complications.  He has 1 of 2 blood cultures with GNR, however, this was not felt to be pyelo and his antibiotic has been discontinued.  I have reviewed his medications and he has no noted major interacting meds.  PTA dose: 5 mg on Tues/Thurs, 2.5 on all other days  Goal of Therapy:  INR 2-3 Monitor platelets by anticoagulation protocol: Yes    Plan:  - Continue his home regimen as noted above - Cont daily INR  Rober Minion, PharmD., MS Clinical Pharmacist Pager:  [phone] Thank you for allowing pharmacy to be part of this patients care team. 07/16/2014 7:52 AM

## 2014-07-17 LAB — PROTIME-INR
INR: 2.09 — ABNORMAL HIGH (ref 0.00–1.49)
Prothrombin Time: 23.7 seconds — ABNORMAL HIGH (ref 11.6–15.2)

## 2014-07-17 LAB — CULTURE, BLOOD (ROUTINE X 2)

## 2014-07-17 MED ORDER — WARFARIN SODIUM 5 MG PO TABS
5.0000 mg | ORAL_TABLET | Freq: Once | ORAL | Status: DC
  Filled 2014-07-17: qty 1

## 2014-07-17 MED ORDER — CEFUROXIME AXETIL 500 MG PO TABS: 500.0000 mg | ORAL_TABLET | Freq: Two times a day (BID) | ORAL | Status: DC

## 2014-07-17 MED ORDER — WARFARIN SODIUM 5 MG PO TABS: 5.0000 mg | ORAL_TABLET | ORAL | Status: DC

## 2014-07-17 MED ORDER — WARFARIN SODIUM 2.5 MG PO TABS: 2.5000 mg | ORAL_TABLET | ORAL | Status: DC

## 2014-07-17 MED ORDER — WARFARIN SODIUM 2.5 MG PO TABS
2.5000 mg | ORAL_TABLET | ORAL | Status: DC
  Filled 2014-07-17: qty 1

## 2014-07-17 MED ORDER — MECLIZINE HCL 25 MG PO TABS: 25.0000 mg | ORAL_TABLET | Freq: Three times a day (TID) | ORAL | Status: DC

## 2014-07-17 NOTE — Progress Notes (Signed)
07/17/14 Patient discharged home via w/c to entrance, IV site removed, and discharge instructions reviewed with patient.

## 2014-07-17 NOTE — Discharge Summary (Signed)
Physician Discharge Summary  Chris Reyes DXI:[id number] DOB: [birth year] DOA: 07/13/2014  PCP: Gwendolyn Grant, MD  Admit date: 07/13/2014 Discharge date: 07/17/2014  Time spent: 35 minutes  Recommendations for Outpatient Follow-up:  1. Needs continued vestibular rehabilitation as an outpatient which will be set up 2. Consider close follow-up with cardiology for valve replacement of aortic valve 3. Complete course of Ceftin for 5 days as unexplained gram-negative bacteremia  4. Consider A1c and dietary modification as an outpatient   Discharge Diagnoses:  Principal Problem:   Vertigo Active Problems:   Essential hypertension   CAD (coronary artery disease)   Aortic stenosis   Pulmonary emboli   Discharge Condition: good  Diet recommendation: heart healthy  Filed Weights   07/13/14 2213  Weight: 111.494 kg (245 lb 12.8 oz)    Brief narrative: 63 ? Severe AoS, CAd s/p Cath 2009/2006, PE on Coumadin 01/14/11/2007, Chronic Carotid disease 12/12, COPD, HLD, PAD, GIB 2006 admitted with subacute onset dizzyness for abotu 2-3 weeks which culminated in worsening symptoms and came to ED for eval thought to have Pyelo but no symptoms See below   Hospital Course:   1. Vertigo- benign positional -schedule meclizine 25 orally 3 times a day to see if this improves things.vestibular rehabilitation really helped him and he had attenuation of his symptoms and was almost back to normal although had mild dizziness. He will need close outpatient follow-up with physical therapy for vestibular rehabilitation and will need to continue his meclizine 2. Severe aortic stenosis-see above discussion. Force fluids orally-4 replacement surgery in January 3. unlikely pyelonephritis-discontinue ceftriaxone, however one of 2 blood cultures from 11/11 grew gram-negative rods. It was felt that patient would benefit from at least 5 days treatment with cephalosporin Ceftin 500 twice a day. 4. Hypertension  continue Imdur 15, losartan 50 daily 5. Recurrent PEs-continue chronic Coumadin-INR therapeutic 6. Prior GI bleed 2006 7. CAD status post cath 2006, 2009-continue aspirin 81 mg 8. Impaired glucose tolerance-outpatient testing and decision about medication then  Discharge Exam: Filed Vitals:   07/17/14 0552  BP: 131/73  Pulse: 60  Temp: 97.5 F (36.4 C)  Resp: 18    General: alert pleasant oriented no apparent distress Cardiovascular: S1-S2 no murmur rub or gallop Respiratory: clinically clear  Discharge Instructions You were cared for by a hospitalist during your hospital stay. If you have any questions about your discharge medications or the care you received while you were in the hospital after you are discharged, you can call the unit and asked to speak with the hospitalist on call if the hospitalist that took care of you is not available. Once you are discharged, your primary care physician will handle any further medical issues. Please note that NO REFILLS for any discharge medications will be authorized once you are discharged, as it is imperative that you return to your primary care physician (or establish a relationship with a primary care physician if you do not have one) for your aftercare needs so that they can reassess your need for medications and monitor your lab values.  Discharge Instructions    Diet - low sodium heart healthy    Complete by:  As directed      Discharge instructions    Complete by:  As directed   He was diagnosed with benign positional vertigo which will need ongoing physical therapy input as an outpatient which we will order. Please follow-up with cardiologist for valve replacement     Increase activity slowly  Complete by:  As directed           Current Discharge Medication List    START taking these medications   Details  cefUROXime (CEFTIN) 500 MG tablet Take 1 tablet (500 mg total) by mouth 2 (two) times daily with a meal. Qty: 10 tablet,  Refills: 0    meclizine (ANTIVERT) 25 MG tablet Take 1 tablet (25 mg total) by mouth 3 (three) times daily. Qty: 30 tablet, Refills: 0      CONTINUE these medications which have NOT CHANGED   Details  acetaminophen (TYLENOL) 500 MG tablet Take 1,500 mg by mouth every 6 (six) hours as needed (pain).    aspirin EC 81 MG EC tablet Take 1 tablet (81 mg total) by mouth daily.    atorvastatin (LIPITOR) 80 MG tablet Take 1 tablet (80 mg total) by mouth daily. Qty: 30 tablet, Refills: 11    Coenzyme Q10 (COQ10) 100 MG CAPS Take 1 capsule by mouth daily.     fish oil-omega-3 fatty acids 1000 MG capsule Take 1 g by mouth daily.    fluticasone (FLONASE) 50 MCG/ACT nasal spray Place 1 spray into both nostrils daily. Qty: 16 g, Refills: 2    ibuprofen (ADVIL,MOTRIN) 200 MG tablet Take 600 mg by mouth every 6 (six) hours as needed (pain).    isosorbide mononitrate (IMDUR) 30 MG 24 hr tablet Take 30 mg by mouth daily.    losartan (COZAAR) 50 MG tablet Take 1 tablet (50 mg total) by mouth daily. Qty: 90 tablet, Refills: 3    warfarin (COUMADIN) 5 MG tablet Take 2.5-5 mg by mouth daily. Takes 5mg  on Tues and Thursdays and half a tab 2.5 mg Mon. Wed, Fri, Sat, Sun       No Known Allergies    The results of significant diagnostics from this hospitalization (including imaging, microbiology, ancillary and laboratory) are listed below for reference.    Significant Diagnostic Studies: Dg Chest 2 View  07/13/2014   CLINICAL DATA:  Dizziness for 2 months now unable to walk, cough for 2 days, smoker, history COPD, hypertension, peripheral vascular disease, carotid artery disease, coronary artery disease, bradycardia, pulmonary embolism  EXAM: CHEST  2 VIEW  COMPARISON:  A 07/2014  FINDINGS: Enlargement of cardiac silhouette with pulmonary vascular congestion.  Mediastinal contours normal.  Lungs emphysematous but clear.  No pleural effusion or pneumothorax.  Multilevel endplate spur formation  thoracic spine.  IMPRESSION: Enlargement of cardiac silhouette with pulmonary vascular congestion.  COPD changes without infiltrate.   Electronically Signed   By: Lavonia Dana M.D.   On: 07/13/2014 16:45   Mr Brain Wo Contrast  07/13/2014   CLINICAL DATA:  Dizziness and generalized weakness.  EXAM: MRI HEAD WITHOUT CONTRAST  TECHNIQUE: Multiplanar, multiecho pulse sequences of the brain and surrounding structures were obtained without intravenous contrast.  COMPARISON:  MRI brain 04/12/2014  FINDINGS: The diffusion-weighted images demonstrate no evidence for acute or subacute infarction. Mild periventricular white matter changes bilaterally are similar to the prior study. Mild generalized atrophy is unchanged. The ventricles are proportionate to the degree of atrophy. Dilated perivascular spaces are again noted bilaterally. No significant extraaxial fluid collection is present.  Flow is present in the major intracranial arteries. The globes and orbits are intact. A midline structures are within normal limits. Small polyp or mucous retention cyst is present inferiorly in the right maxillary sinus. The remaining paranasal sinuses and the mastoid air cells are clear.  IMPRESSION: 1. No acute intracranial  abnormality or significant interval change. 2. Mild atrophy and white matter disease is stable since the prior exam. 3. Polyps or mucous retention cysts at the floor of the right maxillary sinus are stable as well.   Electronically Signed   By: Lawrence Santiago M.D.   On: 07/13/2014 18:50    Microbiology: Recent Results (from the past 240 hour(s))  Urine culture     Status: None   Collection Time: 07/13/14  3:20 PM  Result Value Ref Range Status   Specimen Description URINE, RANDOM  Final   Special Requests NONE  Final   Culture  Setup Time   Final    07/13/2014 16:23 Performed at [facility] Performed at [facility]   Final   Culture NO GROWTH Performed at  [facility]   Final   Report Status 07/14/2014 FINAL  Final  Culture, blood (routine x 2)     Status: None   Collection Time: 07/14/14  1:09 AM  Result Value Ref Range Status   Specimen Description BLOOD RIGHT ARM  Final   Special Requests   Final    BOTTLES DRAWN AEROBIC AND ANAEROBIC BLUE 10CC RED 3CC   Culture  Setup Time   Final    07/14/2014 09:03 Performed at [facility]    Culture   Final    ESCHERICHIA COLI Note: Gram Stain Report Called to,Read Back By and Verified With: LATOYA FOOTE ON 07/15/2014 AT 12:05A BY WILEJ Performed at [facility]    Report Status 07/17/2014 FINAL  Final   Organism ID, Bacteria ESCHERICHIA COLI  Final      Susceptibility   Escherichia coli - MIC*    AMPICILLIN <=2 SENSITIVE Sensitive     AMPICILLIN/SULBACTAM <=2 SENSITIVE Sensitive     CEFAZOLIN <=4 SENSITIVE Sensitive     CEFEPIME <=1 SENSITIVE Sensitive     CEFTAZIDIME <=1 SENSITIVE Sensitive     CEFTRIAXONE <=1 SENSITIVE Sensitive     CIPROFLOXACIN <=0.25 SENSITIVE Sensitive     GENTAMICIN 2 SENSITIVE Sensitive     IMIPENEM <=0.25 SENSITIVE Sensitive     PIP/TAZO <=4 SENSITIVE Sensitive     TOBRAMYCIN 2 SENSITIVE Sensitive     TRIMETH/SULFA <=20 SENSITIVE Sensitive     * ESCHERICHIA COLI  Culture, blood (routine x 2)     Status: None (Preliminary result)   Collection Time: 07/14/14  1:14 AM  Result Value Ref Range Status   Specimen Description BLOOD LEFT HAND  Final   Special Requests BOTTLES DRAWN AEROBIC ONLY 10CC  Final   Culture  Setup Time   Final    07/14/2014 09:03 Performed at [facility]    Culture   Final           BLOOD CULTURE RECEIVED NO GROWTH TO DATE CULTURE WILL BE HELD FOR 5 DAYS BEFORE ISSUING A FINAL NEGATIVE REPORT Performed at [facility]    Report Status PENDING  Incomplete     Labs: Basic Metabolic Panel:  Recent Labs Lab 07/13/14 1635 07/14/14 0109 07/15/14 0547  NA 134* 133* 132*  K 4.4 3.8 3.9  CL  100 98 97  CO2  --  20 22  GLUCOSE 107* 104* 107*  BUN 13 13 13   CREATININE 0.90 0.90 0.80  CALCIUM  --  8.6 8.4   Liver Function Tests:  Recent Labs Lab 07/14/14 0109 07/15/14 0547  AST 26 30  ALT 25 28  ALKPHOS 53 51  BILITOT 0.9 0.6  PROT 7.0 6.7  ALBUMIN 3.1* 2.9*   No results for input(s): LIPASE, AMYLASE in the last 168 hours. No results for input(s): AMMONIA in the last 168 hours. CBC:  Recent Labs Lab 07/13/14 1524 07/13/14 1635 07/14/14 0109 07/16/14 0555  WBC 10.1  --  9.2 8.6  NEUTROABS 8.9*  --  7.3 5.1  HGB 18.6* 19.7* 17.2* 16.4  HCT 52.7* 58.0* 48.1 47.0  MCV 94.4  --  92.0 94.0  PLT 179  --  159 161   Cardiac Enzymes: No results for input(s): CKTOTAL, CKMB, CKMBINDEX, TROPONINI in the last 168 hours. BNP: BNP (last 3 results) No results for input(s): PROBNP in the last 8760 hours. CBG:  Recent Labs Lab 07/13/14 1528  GLUCAP 116*       SignedNita Sells  [facility] 07/17/2014, 9:44 AM

## 2014-07-17 NOTE — Progress Notes (Signed)
Physical Therapy Treatment Patient Details Name: Chris Reyes MRN: [id number] DOB: 30-Sep-1950 Today's Date: 07/17/2014    History of Present Illness Chris Reyes is a 63 y.o. male with history of CAD, aortic stenosis, pulmonary embolism on Coumadin, hyperlipidemia started experiencing dizziness since last night. Patient states that he was feeling things spinning around whenever he tries to move. Denies any nausea vomiting loss of consciousness. Patient has been having some ringing sound in his ear. Also has been recently having some headaches. Since patient's symptoms persisted patient came to the ER and MRI brain done was negative for anything acute. Patient was finding it difficult to ambulate because of persistent dizziness particularly on motion. Patient also was found to be mildly febrile. UA showing features of UTI. Patient also had complained of nonproductive cough for last few days but chest x-ray was showing nothing acute.    PT Comments    Patient with decreased dizziness today.  Dix-Hallpike tested negative to both sides.  Patient able to ambulate 250' with no assistive device with good balance.  Did stagger to left x2 but able to self correct.  Recommend f/u HHPT for vestibular rehab.  Patient ready for discharge from PT perspective.   Follow Up Recommendations  Supervision/Assistance - 24 hour;  Home health PT (for Vestibular Rehab)     Equipment Recommendations  None recommended by PT    Recommendations for Other Services       Precautions / Restrictions Precautions Precautions: Fall Precaution Comments: Vertigo Restrictions Weight Bearing Restrictions: No    Mobility  Bed Mobility Overal bed mobility: Modified Independent Bed Mobility: Rolling;Sidelying to Sit;Sit to Sidelying Rolling: Modified independent (Device/Increase time) Sidelying to sit: Modified independent (Device/Increase time)     Sit to sidelying: Modified independent (Device/Increase  time) General bed mobility comments: Increased time for mobility.  No physical assist needed.  No increase in dizziness with change in position between sidelying <> sitting.  Transfers Overall transfer level: Modified independent Equipment used: None Transfers: Sit to/from Stand Sit to Stand: Modified independent (Device/Increase time)         General transfer comment: No loss of balance.  Using safe technique.  Ambulation/Gait Ambulation/Gait assistance: Modified independent (Device/Increase time) Ambulation Distance (Feet): 250 Feet Assistive device: None Gait Pattern/deviations: Step-through pattern;Decreased stride length Gait velocity: WFL   General Gait Details: Patient reports no change in dizziness/vertigo with gait.  No loss of balance with gait.  Noted slight staggering to left x2, but able to self-correct.   Stairs            Wheelchair Mobility    Modified Rankin (Stroke Patients Only)       Balance           Standing balance support: No upper extremity supported Standing balance-Leahy Scale: Good Standing balance comment: Improved today with no loss of balance with gait.                    Cognition Arousal/Alertness: Awake/alert Behavior During Therapy: WFL for tasks assessed/performed Overall Cognitive Status: Within Functional Limits for tasks assessed                      Exercises Other Exercises Other Exercises: Dix-Hallpike:  negative to both sides today.  No dizziness with return to sitting today.  Good sitting balance without vertigo.    General Comments        Pertinent Vitals/Pain Pain Assessment: No/denies pain    Home Living  Prior Function            PT Goals (current goals can now be found in the care plan section) Progress towards PT goals: Progressing toward goals    Frequency  Min 3X/week    PT Plan Current plan remains appropriate    Co-evaluation              End of Session   Activity Tolerance: Patient tolerated treatment well Patient left: in bed;with call bell/phone within reach (Sitting EOB)     Time: 7782-4235 PT Time Calculation (min) (ACUTE ONLY): 23 min  Charges:  $Therapeutic Activity: 23-37 mins                    G Codes:  Functional Assessment Tool Used: clinical judgement Functional Limitation: Mobility: Walking and moving around Mobility: Walking and Moving Around Goal Status [id number]): At least 1 percent but less than 20 percent impaired, limited or restricted Mobility: Walking and Moving Around Discharge Status [phone]): At least 1 percent but less than 20 percent impaired, limited or restricted   Despina Pole 07/17/2014, 9:32 AM Carita Pian. Sanjuana Kava, [place] Pager [phone]

## 2014-07-17 NOTE — Progress Notes (Signed)
ANTICOAGULATION CONSULT NOTE  Pharmacy Consult for coumadin Indication: DVT  No Known Allergies  Patient Measurements: Height: 5\' 11"  (180.3 cm) Weight: 245 lb 12.8 oz (111.494 kg) IBW/kg (Calculated) : 75.3  Vital Signs: Temp: 97.5 F (36.4 C) (11/15 0552) Temp Source: Oral (11/15 0552) BP: 131/73 mmHg (11/15 0552) Pulse Rate: 60 (11/15 0552)  Labs:  Recent Labs  07/15/14 0547 07/16/14 0555 07/17/14 0525  HGB  --  16.4  --   HCT  --  47.0  --   PLT  --  161  --   LABPROT 21.6* 23.8* 23.7*  INR 1.86* 2.10* 2.09*  CREATININE 0.80  --   --    Estimated Creatinine Clearance: 118.5 mL/min (by C-G formula based on Cr of 0.8).  Medical History: Past Medical History  Diagnosis Date  . Hypertension   . Hernia   . COPD (chronic obstructive pulmonary disease)   . CAD (coronary artery disease)     Catheterization, [place], July, 2006, EF 45%,  50% LAD, 50% RCA, 90% marginal, possible collaterals from LAD  to a branch of the PDA, medical therapy recommended  . GI bleed     2006, no significant abnormalities per the patient  . PAD (peripheral artery disease)     Arterial leg Dopplers, April, 2014, normal  . Ejection fraction     EF 45%, catheterization, 2006  //   EF 55%, echo, March, 2012, wall abnormality at the base of the inferior wall  . Aortic stenosis     severe echo 01/2013- cannot rule out bicuspid aortic valve  //   moderately severe, echo,  March, 2012 ( progression since 2009)  . RBBB   . Pulmonary emboli 2007    Significant, 2007, Coumadin therapy  . Warfarin anticoagulation     History pulmonary emboli  . Bradycardia   . Tobacco abuse   . Carotid artery disease     Doppler, March, 2012,  0-39% bilateral  . Dyslipidemia   . Heart murmur   . History of hiatal hernia   . GERD (gastroesophageal reflux disease)   . Sinus headache   . Migraine     "not very often at all" (07/13/2014)  . Chronic lower back pain    Medications:  Prescriptions prior to  admission  Medication Sig Dispense Refill Last Dose  . acetaminophen (TYLENOL) 500 MG tablet Take 1,500 mg by mouth every 6 (six) hours as needed (pain).   07/12/2014 at Unknown time  . aspirin EC 81 MG EC tablet Take 1 tablet (81 mg total) by mouth daily.   07/12/2014 at 8a  . atorvastatin (LIPITOR) 80 MG tablet Take 1 tablet (80 mg total) by mouth daily. 30 tablet 11 07/12/2014 at Unknown time  . Coenzyme Q10 (COQ10) 100 MG CAPS Take 1 capsule by mouth daily.    07/12/2014 at Unknown time  . fish oil-omega-3 fatty acids 1000 MG capsule Take 1 g by mouth daily.   07/12/2014 at Unknown time  . fluticasone (FLONASE) 50 MCG/ACT nasal spray Place 1 spray into both nostrils daily. 16 g 2 07/13/2014 at Unknown time  . ibuprofen (ADVIL,MOTRIN) 200 MG tablet Take 600 mg by mouth every 6 (six) hours as needed (pain).   Past Week at Unknown time  . isosorbide mononitrate (IMDUR) 30 MG 24 hr tablet Take 30 mg by mouth daily.   07/13/2014 at Unknown time  . losartan (COZAAR) 50 MG tablet Take 1 tablet (50 mg total) by mouth daily. 90 tablet  3 07/13/2014 at Unknown time  . warfarin (COUMADIN) 5 MG tablet Take 2.5-5 mg by mouth daily. Takes 5mg  on Tues and Thursdays and half a tab 2.5 mg Mon. Wed, Fri, Sat, Sun   07/13/2014 at 12p   Assessment: 63 yo man admitted with vertigo. Pt is on warfarin for h/o DVT/PE.  His INR is therapeutic at 2.09 but down after resuming his home regimen.  I think he may need a slight increase in his regimen (see below).   His CBC is stable and no noted bleeding complications.  He has 1 of 2 blood cultures with Ecoli, and received IV Ceftriaxone which showed sensitivity to this bacteria.  He is currently without symptoms of infection.  I have reviewed his medications and he is taking low dose aspirin which has the potential for increased bleeding risk but other than that he has no noted major interacting meds.  NEW Home Regimen Plan:  - Warfarin 5 mg on Tue./Thur./Sat  - Warfarin 2.5  mg on Mon./Wed./Fri./Sun  Goal of Therapy:  INR 2-3 Monitor platelets by anticoagulation protocol: Yes   Plan:  - Will give 5 mg today and then start him on a new regimen as stated above. - Cont daily INR  Rober Minion, PharmD., MS Clinical Pharmacist Pager:  [phone] Thank you for allowing pharmacy to be part of this patients care team. 07/17/2014 7:40 AM

## 2014-07-17 NOTE — Plan of Care (Signed)
Problem: Acute Rehab PT Goals(only PT should resolve) Goal: PT Additional Goal #1 Outcome: Completed/Met Date Met:  07/17/14

## 2014-07-20 ENCOUNTER — Ambulatory Visit: Payer: Medicare Other | Admitting: Cardiothoracic Surgery

## 2014-07-20 LAB — CULTURE, BLOOD (ROUTINE X 2): Culture: NO GROWTH

## 2014-07-27 ENCOUNTER — Ambulatory Visit (INDEPENDENT_AMBULATORY_CARE_PROVIDER_SITE_OTHER): Payer: Medicare Other

## 2014-07-27 DIAGNOSIS — I2699 Other pulmonary embolism without acute cor pulmonale: Secondary | ICD-10-CM

## 2014-07-27 LAB — POCT INR: INR: 2.7

## 2014-08-01 ENCOUNTER — Ambulatory Visit (INDEPENDENT_AMBULATORY_CARE_PROVIDER_SITE_OTHER): Payer: Medicare Other | Admitting: Family

## 2014-08-01 ENCOUNTER — Encounter: Payer: Self-pay | Admitting: Family

## 2014-08-01 VITALS — BP 122/64 | HR 62 | Temp 97.5°F | Resp 18 | Ht 71.0 in | Wt 247.0 lb

## 2014-08-01 DIAGNOSIS — I2510 Atherosclerotic heart disease of native coronary artery without angina pectoris: Secondary | ICD-10-CM

## 2014-08-01 DIAGNOSIS — I10 Essential (primary) hypertension: Secondary | ICD-10-CM

## 2014-08-01 DIAGNOSIS — R42 Dizziness and giddiness: Secondary | ICD-10-CM

## 2014-08-01 MED ORDER — MECLIZINE HCL 25 MG PO TABS: 25.0000 mg | ORAL_TABLET | Freq: Three times a day (TID) | ORAL | Status: DC

## 2014-08-01 NOTE — Patient Instructions (Signed)
Thank you for choosing [facility].  Summary/Instructions:  Please continue taking your blood pressure medications until discussed with cardiology.   Take the meclizine as needed for vertigo.   Your prescription(s) have been submitted to your pharmacy. Please take as directed and contact our office if you believe you are having problem(s) with the medication(s).  If your symptoms worsen or fail to improve, please contact our office for further instruction, or in case of emergency go directly to the emergency room at the closest medical facility.    Vertigo Vertigo means you feel like you or your surroundings are moving when they are not. Vertigo can be dangerous if it occurs when you are at work, driving, or performing difficult activities.  CAUSES  Vertigo occurs when there is a conflict of signals sent to your brain from the visual and sensory systems in your body. There are many different causes of vertigo, including:  Infections, especially in the inner ear.  A bad reaction to a drug or misuse of alcohol and medicines.  Withdrawal from drugs or alcohol.  Rapidly changing positions, such as lying down or rolling over in bed.  A migraine headache.  Decreased blood flow to the brain.  Increased pressure in the brain from a head injury, infection, tumor, or bleeding. SYMPTOMS  You may feel as though the world is spinning around or you are falling to the ground. Because your balance is upset, vertigo can cause nausea and vomiting. You may have involuntary eye movements (nystagmus). DIAGNOSIS  Vertigo is usually diagnosed by physical exam. If the cause of your vertigo is unknown, your caregiver may perform imaging tests, such as an MRI scan (magnetic resonance imaging). TREATMENT  Most cases of vertigo resolve on their own, without treatment. Depending on the cause, your caregiver may prescribe certain medicines. If your vertigo is related to body position issues, your  caregiver may recommend movements or procedures to correct the problem. In rare cases, if your vertigo is caused by certain inner ear problems, you may need surgery. HOME CARE INSTRUCTIONS   Follow your caregiver's instructions.  Avoid driving.  Avoid operating heavy machinery.  Avoid performing any tasks that would be dangerous to you or others during a vertigo episode.  Tell your caregiver if you notice that certain medicines seem to be causing your vertigo. Some of the medicines used to treat vertigo episodes can actually make them worse in some people. SEEK IMMEDIATE MEDICAL CARE IF:   Your medicines do not relieve your vertigo or are making it worse.  You develop problems with talking, walking, weakness, or using your arms, hands, or legs.  You develop severe headaches.  Your nausea or vomiting continues or gets worse.  You develop visual changes.  A family member notices behavioral changes.  Your condition gets worse. MAKE SURE YOU:  Understand these instructions.  Will watch your condition.  Will get help right away if you are not doing well or get worse. Document Released: 05/29/2005 Document Revised: 11/11/2011 Document Reviewed: 03/07/2011 [facility] Patient Information 2015 [place], [place]. This information is not intended to replace advice given to you by your health care provider. Make sure you discuss any questions you have with your health care provider.

## 2014-08-01 NOTE — Assessment & Plan Note (Addendum)
Stable at this time. Change meclizine to as needed. Will be completing therapy on 12/2. Instructed to drink plenty of fluids and follow up if symptoms worsen or fail to improve. Cannot completely rule out Imdur as potential cause.

## 2014-08-01 NOTE — Progress Notes (Signed)
Pre visit review using our clinic review tool, if applicable. No additional management support is needed unless otherwise documented below in the visit note. 

## 2014-08-01 NOTE — Progress Notes (Signed)
Subjective:    Patient ID: Chris Reyes, male    DOB: Jun 11, 1951, 63 y.o.   MRN: [id number]  Chief Complaint  Patient presents with  . Follow-up    Said his bp has been running low according to the hospital, wants to know if he needs to continue vertigo medication    HPI:  Chris Reyes is a 64 y.o. male who presents today to discuss hypertension and vertigo.     1) Hypertension - Currently maintained on losartan and isosorbide mononitrate. Denies any chest pain/discomfort, shortness of breath, palpitations, edema.   BP Readings from Last 3 Encounters:  08/01/14 122/64  07/17/14 131/73  06/15/14 138/76    2) Vertigo -  Has been going on since August, felt like he couldn't get up because he was dizzy. Was not able to move, sent to the hospital - never really diagnosed. Had liquids and feeling better. In November had another occurrence - felt wobbly at night when trying to go the bathroom.  That morning his legs felt weak and had the dizziness again. Was once again sent to the hospital. Started on meclizine and has started working with therapist. Currently questioning if the therapist is helping more than the medication. Has not taken the meclizine in about a week and has felt good. Has last therapy appointment on Thursday this week. At rest and moving he is fine. Standing up sometimes it occurs.   No Known Allergies  Current Outpatient Prescriptions on File Prior to Visit  Medication Sig Dispense Refill  . acetaminophen (TYLENOL) 500 MG tablet Take 1,500 mg by mouth every 6 (six) hours as needed (pain).    Marland Kitchen aspirin EC 81 MG EC tablet Take 1 tablet (81 mg total) by mouth daily.    Marland Kitchen atorvastatin (LIPITOR) 80 MG tablet Take 1 tablet (80 mg total) by mouth daily. 30 tablet 11  . Coenzyme Q10 (COQ10) 100 MG CAPS Take 1 capsule by mouth daily.     . fish oil-omega-3 fatty acids 1000 MG capsule Take 1 g by mouth daily.    . fluticasone (FLONASE) 50 MCG/ACT nasal spray Place 1 spray  into both nostrils daily. 16 g 2  . ibuprofen (ADVIL,MOTRIN) 200 MG tablet Take 600 mg by mouth every 6 (six) hours as needed (pain).    . isosorbide mononitrate (IMDUR) 30 MG 24 hr tablet Take 30 mg by mouth daily.    Marland Kitchen losartan (COZAAR) 50 MG tablet Take 1 tablet (50 mg total) by mouth daily. 90 tablet 3  . warfarin (COUMADIN) 5 MG tablet Take 2.5-5 mg by mouth daily. Takes 5mg  on Tues and Thursdays and half a tab 2.5 mg Mon. Wed, Fri, Sat, Sun    . cefUROXime (CEFTIN) 500 MG tablet Take 1 tablet (500 mg total) by mouth 2 (two) times daily with a meal. (Patient not taking: Reported on 08/01/2014) 10 tablet 0  . [DISCONTINUED] LISINOPRIL PO Take by mouth daily.       No current facility-administered medications on file prior to visit.    Review of Systems    See HPI  Objective:    BP 122/64 mmHg  Pulse 62  Temp(Src) 97.5 F (36.4 C) (Oral)  Resp 18  Ht 5\' 11"  (1.803 m)  Wt 247 lb (112.038 kg)  BMI 34.46 kg/m2  SpO2 99% Nursing note and vital signs reviewed.  Physical Exam  Constitutional: He is oriented to person, place, and time. He appears well-developed and well-nourished. No distress.  Cardiovascular: Normal rate, regular rhythm and intact distal pulses.   Murmur heard. Pulmonary/Chest: Effort normal and breath sounds normal.  Neurological: He is alert and oriented to person, place, and time.  Skin: Skin is warm and dry.  Psychiatric: He has a normal mood and affect. His behavior is normal. Judgment and thought content normal.       Assessment & Plan:

## 2014-08-01 NOTE — Assessment & Plan Note (Signed)
Remains stable. Discussed discontinuing blood pressure medications. Given upcoming heart surgery, will continue current isosorbide mononitrate and losartan. Discussed beneficial effects for the losartan. Follow up after surgery per cardiology.

## 2014-08-02 ENCOUNTER — Telehealth: Payer: Self-pay | Admitting: Internal Medicine

## 2014-08-02 NOTE — Telephone Encounter (Signed)
emmi emailed °

## 2014-08-10 ENCOUNTER — Ambulatory Visit: Payer: Medicare Other | Admitting: Cardiology

## 2014-08-17 ENCOUNTER — Ambulatory Visit: Payer: Medicare Other | Admitting: Cardiology

## 2014-09-07 ENCOUNTER — Ambulatory Visit (INDEPENDENT_AMBULATORY_CARE_PROVIDER_SITE_OTHER): Payer: Medicare Other | Admitting: Family Medicine

## 2014-09-07 DIAGNOSIS — I2699 Other pulmonary embolism without acute cor pulmonale: Secondary | ICD-10-CM

## 2014-09-07 DIAGNOSIS — Z5181 Encounter for therapeutic drug level monitoring: Secondary | ICD-10-CM

## 2014-09-07 LAB — POCT INR: INR: 2.7

## 2014-09-09 ENCOUNTER — Other Ambulatory Visit: Payer: Self-pay | Admitting: Internal Medicine

## 2014-09-26 ENCOUNTER — Other Ambulatory Visit: Payer: Self-pay | Admitting: Internal Medicine

## 2014-10-18 ENCOUNTER — Ambulatory Visit (INDEPENDENT_AMBULATORY_CARE_PROVIDER_SITE_OTHER): Payer: Medicare Other | Admitting: General Practice

## 2014-10-18 DIAGNOSIS — I2699 Other pulmonary embolism without acute cor pulmonale: Secondary | ICD-10-CM

## 2014-10-18 DIAGNOSIS — Z5181 Encounter for therapeutic drug level monitoring: Secondary | ICD-10-CM

## 2014-10-18 LAB — POCT INR: INR: 2.6

## 2014-10-18 NOTE — Progress Notes (Signed)
Pre visit review using our clinic review tool, if applicable. No additional management support is needed unless otherwise documented below in the visit note. 

## 2014-10-18 NOTE — Progress Notes (Signed)
Agree with plan 

## 2014-10-26 ENCOUNTER — Other Ambulatory Visit: Payer: Self-pay

## 2014-10-26 MED ORDER — WARFARIN SODIUM 5 MG PO TABS: ORAL_TABLET | ORAL | Status: DC

## 2014-11-29 ENCOUNTER — Ambulatory Visit (INDEPENDENT_AMBULATORY_CARE_PROVIDER_SITE_OTHER): Payer: Medicare Other | Admitting: General Practice

## 2014-11-29 DIAGNOSIS — Z5181 Encounter for therapeutic drug level monitoring: Secondary | ICD-10-CM

## 2014-11-29 DIAGNOSIS — I2699 Other pulmonary embolism without acute cor pulmonale: Secondary | ICD-10-CM

## 2014-11-29 LAB — POCT INR: INR: 2.5

## 2014-11-29 NOTE — Progress Notes (Signed)
Pre visit review using our clinic review tool, if applicable. No additional management support is needed unless otherwise documented below in the visit note. 

## 2014-11-29 NOTE — Progress Notes (Signed)
Agree with plan 

## 2014-12-19 ENCOUNTER — Ambulatory Visit (INDEPENDENT_AMBULATORY_CARE_PROVIDER_SITE_OTHER): Payer: Medicare Other | Admitting: Internal Medicine

## 2014-12-19 ENCOUNTER — Encounter: Payer: Self-pay | Admitting: Internal Medicine

## 2014-12-19 VITALS — BP 144/80 | HR 64 | Temp 98.2°F | Resp 18 | Ht 71.0 in | Wt 248.1 lb

## 2014-12-19 DIAGNOSIS — I350 Nonrheumatic aortic (valve) stenosis: Secondary | ICD-10-CM

## 2014-12-19 DIAGNOSIS — I10 Essential (primary) hypertension: Secondary | ICD-10-CM

## 2014-12-19 DIAGNOSIS — Z720 Tobacco use: Secondary | ICD-10-CM

## 2014-12-19 DIAGNOSIS — E785 Hyperlipidemia, unspecified: Secondary | ICD-10-CM

## 2014-12-19 DIAGNOSIS — I359 Nonrheumatic aortic valve disorder, unspecified: Secondary | ICD-10-CM | POA: Diagnosis not present

## 2014-12-19 DIAGNOSIS — F17200 Nicotine dependence, unspecified, uncomplicated: Secondary | ICD-10-CM

## 2014-12-19 NOTE — Assessment & Plan Note (Signed)
Patient claims to have been off lipitor for 1 year plus and last lipid panel 8 months ago off therapy. Not due for recheck today but consider rechecking prior to his cardiac surgery.

## 2014-12-19 NOTE — Assessment & Plan Note (Signed)
BP mildly elevated at visit, per his reports okay at home. Continue losartan and imdur for now. If persistently high will need titration of therapy.

## 2014-12-19 NOTE — Progress Notes (Signed)
   Subjective:    Patient ID: Chris Reyes, male    DOB: Oct 12, 1950, 64 y.o.   MRN: [id number]  HPI The patient is coming in for follow up of his high blood pressure. He is still taking his medications as prescribed. Several months ago he thought that the imdur was causing vertigo but PT helped resolve this. He is still taking the imdur and losartan without any problems. BP is okay at home. No headaches, chest pains, nausea, confusion. He also has several questions about his aortic stenosis and his risk of delaying surgery.   Review of Systems  Constitutional: Negative for fever, activity change, appetite change, fatigue and unexpected weight change.  Respiratory: Negative for cough, chest tightness, shortness of breath and wheezing.   Cardiovascular: Negative for chest pain, palpitations and leg swelling.  Gastrointestinal: Negative for nausea, abdominal pain, diarrhea, constipation and abdominal distention.  Musculoskeletal: Negative.   Neurological: Negative for dizziness, weakness, light-headedness, numbness and headaches.      Objective:   Physical Exam  Constitutional: He is oriented to person, place, and time. He appears well-developed and well-nourished.  Overweight  HENT:  Head: Normocephalic and atraumatic.  Eyes: EOM are normal.  Neck: Normal range of motion.  Cardiovascular: Normal rate.   Murmur heard. Systolic murmur that obliterates S2.   Pulmonary/Chest: Effort normal and breath sounds normal. No respiratory distress. He has no wheezes.  Abdominal: Soft. Bowel sounds are normal. He exhibits no distension.  Musculoskeletal: He exhibits no edema.  Neurological: He is alert and oriented to person, place, and time. Coordination normal.  Skin: Skin is warm and dry.  Psychiatric: He has a normal mood and affect.   Filed Vitals:   12/19/14 1055  BP: 144/80  Pulse: 64  Temp: 98.2 F (36.8 C)  TempSrc: Oral  Resp: 18  Height: 5\' 11"  (1.803 m)  Weight: 248 lb 1.9 oz  (112.546 kg)  SpO2: 96%      Assessment & Plan:

## 2014-12-19 NOTE — Patient Instructions (Signed)
We have ordered the repeat echo of the heart to check on the aortic valve and get you back in with Dr. Ron Parker to discuss the valve and the results.   Come back to see [place] in about 6 months to make sure things are going well.

## 2014-12-19 NOTE — Assessment & Plan Note (Signed)
He is not smoke free for 6 months. Reminded him about the risks and harms of cigarette smoke. Recommended that enough time has passed to be ready for surgery.

## 2014-12-19 NOTE — Assessment & Plan Note (Signed)
Ordered repeat echo as he has not had in about 1 year. No dyspnea or syncope. Talked to him about the risks of permanent damage to his heart from waiting for the surgery. He is concerned about his wife (agoraphobic and severe panic). Talked to him about staying in rehab after surgery if he needs help. Will call cardiology to get him back in for the cath so that he can get the valve replacement. He is smoke free for about 6 months and congratulated him on that.

## 2014-12-19 NOTE — Progress Notes (Signed)
Pre visit review using our clinic review tool, if applicable. No additional management support is needed unless otherwise documented below in the visit note. 

## 2014-12-21 ENCOUNTER — Other Ambulatory Visit (HOSPITAL_COMMUNITY): Payer: Medicare Other

## 2014-12-21 ENCOUNTER — Ambulatory Visit: Payer: Medicare Other | Admitting: Cardiology

## 2015-01-10 ENCOUNTER — Ambulatory Visit (INDEPENDENT_AMBULATORY_CARE_PROVIDER_SITE_OTHER): Payer: Medicare Other | Admitting: General Practice

## 2015-01-10 DIAGNOSIS — Z5181 Encounter for therapeutic drug level monitoring: Secondary | ICD-10-CM

## 2015-01-10 DIAGNOSIS — I2699 Other pulmonary embolism without acute cor pulmonale: Secondary | ICD-10-CM

## 2015-01-10 LAB — POCT INR: INR: 2.5

## 2015-01-10 NOTE — Progress Notes (Signed)
Pre visit review using our clinic review tool, if applicable. No additional management support is needed unless otherwise documented below in the visit note. 

## 2015-01-10 NOTE — Progress Notes (Signed)
Agree with plan 

## 2015-01-20 ENCOUNTER — Encounter: Payer: Self-pay | Admitting: Cardiology

## 2015-01-20 ENCOUNTER — Ambulatory Visit (INDEPENDENT_AMBULATORY_CARE_PROVIDER_SITE_OTHER): Payer: Medicare Other | Admitting: Cardiology

## 2015-01-20 ENCOUNTER — Telehealth: Payer: Self-pay | Admitting: Cardiology

## 2015-01-20 ENCOUNTER — Ambulatory Visit (HOSPITAL_COMMUNITY): Payer: Medicare Other | Attending: Cardiology

## 2015-01-20 ENCOUNTER — Other Ambulatory Visit: Payer: Self-pay

## 2015-01-20 VITALS — BP 124/60 | HR 62 | Ht 71.0 in | Wt 252.0 lb

## 2015-01-20 DIAGNOSIS — I350 Nonrheumatic aortic (valve) stenosis: Secondary | ICD-10-CM

## 2015-01-20 DIAGNOSIS — I359 Nonrheumatic aortic valve disorder, unspecified: Secondary | ICD-10-CM | POA: Insufficient documentation

## 2015-01-20 DIAGNOSIS — E785 Hyperlipidemia, unspecified: Secondary | ICD-10-CM | POA: Diagnosis not present

## 2015-01-20 DIAGNOSIS — I2510 Atherosclerotic heart disease of native coronary artery without angina pectoris: Secondary | ICD-10-CM | POA: Diagnosis not present

## 2015-01-20 DIAGNOSIS — Z7901 Long term (current) use of anticoagulants: Secondary | ICD-10-CM

## 2015-01-20 DIAGNOSIS — I10 Essential (primary) hypertension: Secondary | ICD-10-CM | POA: Diagnosis not present

## 2015-01-20 NOTE — Patient Instructions (Signed)
Medication Instructions:  Same  Labwork: None  Testing/Procedures: None  Follow-Up: Your physician recommends that you schedule a follow-up appointment in: July

## 2015-01-20 NOTE — Progress Notes (Signed)
Cardiology Office Note   Date:  01/20/2015   ID:  Burnham, Trost 05/12/1951, MRN [id number]  PCP:  Olga Millers, MD  Cardiologist:  Dola Argyle, MD   Chief Complaint  Patient presents with  . Appointment    Follow-up aortic stenosis      History of Present Illness: Chris Reyes is a 64 y.o. male who presents to follow-up severe/critical aortic stenosis. I saw the patient last October, 2015. He had artery seen Dr. Prescott Gum for surgical consultation for aortic valve replacement. The plan was to proceed with surgery. However the patient told me that it would have to be delayed and could not be done before January of 2016. For that reason I did not proceed with catheterization at that time. The patient has delayed further because of difficulties getting support for a family member. Most recently he was seen by Dr. Doug Sou who urged him to proceed with evaluation again. He had an echo today. He has critical aortic stenosis.  The patient tells me he still active. He says that he is still able to work in a garden. This seems unusual with the severity of disease. However I'm very concerned that he could have a sudden cardiac event with excess physical activity.  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 He provides all of her care and has not been able to find anyone to help while he has his aortic surgery.    Past Medical History  Diagnosis Date  . Hypertension   . Hernia   . COPD (chronic obstructive pulmonary disease)   . CAD (coronary artery disease)     Catheterization, [place], July, 2006, EF 45%,  50% LAD, 50% RCA, 90% marginal, possible collaterals from LAD  to a branch of the PDA, medical therapy recommended  . GI bleed     2006, no significant abnormalities per the patient  . PAD (peripheral artery disease)     Arterial leg Dopplers, April, 2014, normal  . Ejection fraction     EF 45%, catheterization, 2006  //   EF 55%,  echo, March, 2012, wall abnormality at the base of the inferior wall  . Aortic stenosis     severe echo 01/2013- cannot rule out bicuspid aortic valve  //   moderately severe, echo,  March, 2012 ( progression since 2009)  . RBBB   . Pulmonary emboli 2007    Significant, 2007, Coumadin therapy  . Warfarin anticoagulation     History pulmonary emboli  . Bradycardia   . Tobacco abuse   . Carotid artery disease     Doppler, March, 2012,  0-39% bilateral  . Dyslipidemia   . Heart murmur   . History of hiatal hernia   . GERD (gastroesophageal reflux disease)   . Sinus headache   . Migraine     "not very often at all" (07/13/2014)  . Chronic lower back pain     Past Surgical History  Procedure Laterality Date  . Back surgery  1988, 1990, 2006  . Cholecystectomy  1998  . Tonsillectomy    . Carpal tunnel release Left ~ 1994  . Lumbar disc surgery  1988; 1990  . Posterior lumbar fusion  2006    Patient Active Problem List   Diagnosis Date Noted  . Encounter for therapeutic drug monitoring 01/10/2015  . Vertigo 07/13/2014  . Peripheral neuropathy 02/01/2014  . Obesity, Class II, BMI 35-39.9 02/02/2013  .  Healthcare maintenance 02/02/2013  . PAD (peripheral artery disease)   . CAD (coronary artery disease)   . GI bleed   . Ejection fraction   . Aortic stenosis   . Pulmonary emboli   . Warfarin anticoagulation   . Carotid artery disease   . RBBB   . HERNIA, UMBILICAL 26/71/2458  . TOBACCO ABUSE 09/25/2010  . Essential hypertension 09/24/2010  . COPD 09/24/2010  . BRADYCARDIA 01/26/2009  . Hyperlipidemia 06/20/2007  . CARPAL TUNNEL SYNDROME 06/20/2007      Current Outpatient Prescriptions  Medication Sig Dispense Refill  . acetaminophen (TYLENOL) 500 MG tablet Take 1,500 mg by mouth every 6 (six) hours as needed (pain).    Marland Kitchen aspirin EC 81 MG EC tablet Take 1 tablet (81 mg total) by mouth daily.    Marland Kitchen atorvastatin (LIPITOR) 80 MG tablet Take 1 tablet (80 mg total) by  mouth daily. (Patient not taking: Reported on 12/19/2014) 30 tablet 11  . Coenzyme Q10 (COQ10) 100 MG CAPS Take 1 capsule by mouth daily.     . fish oil-omega-3 fatty acids 1000 MG capsule Take 1 g by mouth daily.    . fluticasone (FLONASE) 50 MCG/ACT nasal spray Place 1 spray into both nostrils daily. 16 g 2  . ibuprofen (ADVIL,MOTRIN) 200 MG tablet Take 600 mg by mouth every 6 (six) hours as needed (pain).    . isosorbide mononitrate (IMDUR) 30 MG 24 hr tablet Take 30 mg by mouth daily.    Marland Kitchen losartan (COZAAR) 50 MG tablet Take 1 tablet (50 mg total) by mouth daily. 90 tablet 3  . warfarin (COUMADIN) 5 MG tablet TAKE AS DIRECTED BY ANTICOAGULATION CLINIC 90 tablet 0  . [DISCONTINUED] LISINOPRIL PO Take by mouth daily.       No current facility-administered medications for this visit.    Allergies:   Review of patient's allergies indicates no known allergies.    Social History:  The patient  reports that he quit smoking about 6 months ago. His smoking use included Cigarettes. He has a 10 pack-year smoking history. He has never used smokeless tobacco. He reports that he drinks alcohol. He reports that he does not use illicit drugs.   Family History:  The patient's family history includes Cancer in his father; Diabetes in his father; Heart disease in his mother.    ROS:  Please see the history of present illness.   Patient denies fever, chills, headache, sweats, rash, change in vision, change in hearing, chest pain, cough, nausea or vomiting, urinary symptoms. All other systems are reviewed and are negative.     PHYSICAL EXAM: VS:  BP 124/60 mmHg  Pulse 62  Ht 5\' 11"  (1.803 m)  Wt 252 lb (114.306 kg)  BMI 35.16 kg/m2  SpO2 97% , Patient is oriented to person time and place. Affect is normal. He is really quite stable. Head is atraumatic. Sclera and conjunctiva are normal. There is no jugular venous distention. Lungs are clear. Respiratory effort is not labored. Cardiac exam reveals a  harsh crescendo decrescendo systolic murmur with a late peak. This is consistent with severe aortic stenosis. The murmur radiates to both sides of his neck. Carotid upstrokes are delayed. The abdomen is soft. There is no peripheral edema. There are no musculoskeletal deformities. There are no skin rashes.  EKG:   EKG is not done today.   Recent Labs: 07/15/2014: ALT 28; BUN 13; Creatinine 0.80; Potassium 3.9; Sodium 132* 07/16/2014: Hemoglobin 16.4; Platelets 161  Lipid Panel    Component Value Date/Time   CHOL 163 04/12/2014 0520   TRIG 163* 04/12/2014 0520   HDL 26* 04/12/2014 0520   CHOLHDL 6.3 04/12/2014 0520   VLDL 33 04/12/2014 0520   LDLCALC 104* 04/12/2014 0520   LDLDIRECT 138.0 01/26/2009 1336      Wt Readings from Last 3 Encounters:  01/20/15 252 lb (114.306 kg)  12/19/14 248 lb 1.9 oz (112.546 kg)  08/01/14 247 lb (112.038 kg)      Current medicines are reviewed  The patient understands his medications well.     ASSESSMENT AND PLAN:

## 2015-01-20 NOTE — Assessment & Plan Note (Signed)
The patient has known coronary disease by cath in 2006. His coronaries will be reassessed before his aortic valve surgery. He will most likely require bypass in addition to his aortic valve replacement.

## 2015-01-20 NOTE — Assessment & Plan Note (Addendum)
The patient has critical aortic stenosis. Echo today reveals a mean gradient as high as 72 mmHg. EF is maintained but he does have some focal wall motion abnormalities. The patient needs aortic valve surgery. He is in agreement. 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 He has not been able to get help to free him up for his own health care. I made it clear to him that he must move forward towards taking care of his own health. He will be talking with his son about the possibility of coming to visit to help with his care. We are trying to help with social service advice concerning getting help in the home. He will have a follow-up appointment with Dr. Prescott Gum to reestablish and talk about the scheduling for aortic valve surgery, possibly in July. I hope to workout with Dr. Prescott Gum the optimal timing for the patient's cardiac catheterization. We will be flexible and available to try to arrange this at the appropriate time before his surgery.  As part of today's evaluation I have spent greater than 25 minutes with his total care. More than half of this time has been with direct discussion with the patient about the need to follow through with heart surgery and how we might arrange this.

## 2015-01-20 NOTE — Telephone Encounter (Signed)
PCP ordered echo on this patient who is followed by Dr. Ron Parker and now shows critical aortic stenosis

## 2015-01-20 NOTE — Telephone Encounter (Signed)
This encounter was created in error - please disregard.

## 2015-01-20 NOTE — Assessment & Plan Note (Signed)
He is anticoagulated with a history of pulmonary emboli. We will help coordinate his anticoagulation around the time of cath in preparation for valve surgery.

## 2015-02-02 ENCOUNTER — Other Ambulatory Visit: Payer: Self-pay | Admitting: *Deleted

## 2015-02-02 ENCOUNTER — Ambulatory Visit (INDEPENDENT_AMBULATORY_CARE_PROVIDER_SITE_OTHER): Payer: Medicare Other | Admitting: Cardiothoracic Surgery

## 2015-02-02 ENCOUNTER — Encounter: Payer: Self-pay | Admitting: Cardiothoracic Surgery

## 2015-02-02 VITALS — BP 157/80 | HR 66 | Resp 16 | Ht 71.0 in | Wt 252.0 lb

## 2015-02-02 DIAGNOSIS — I2510 Atherosclerotic heart disease of native coronary artery without angina pectoris: Secondary | ICD-10-CM

## 2015-02-02 DIAGNOSIS — I350 Nonrheumatic aortic (valve) stenosis: Secondary | ICD-10-CM

## 2015-02-02 NOTE — Progress Notes (Signed)
PCP is Olga Millers, MD Referring Provider is Carlena Bjornstad, MD  Chief Complaint  Patient presents with  . Aortic Stenosis    RE-EVAL FOR SURGERY PER DR. KATZ'S REQUEST...has had a recent ECHO..01/20/15    HPI:the patient presents for evaluation of his aortic stenosis. I actually saw the patient several months ago after echocardiogram showed significant, severe aortic stenosis. He never agreed to cardiac catheterization but did have dental extractions. He has discussed his situation with Dr. Ron Parker and has now agreed to undergo cardiac catheterization prior to aVR. The patient had cardiac catheterization 10 years in [place] and at that time had moderate coronary disease, not surgical. His EF is normal but he has a bicuspid aortic valve with severe aortic stenosis.  His symptoms are mainly dizziness which he feels his vertigo. He denies syncope or falls. He denies shortness of breath or chest pain.  Patient has chronic venous insufficiency and DVT with history of poor embolus several years ago on chronic Coumadin. No bleeding complications from Coumadin. He stopped smoking cigarettes one year ago.  Past Medical History  Diagnosis Date  . Hypertension   . Hernia   . COPD (chronic obstructive pulmonary disease)   . CAD (coronary artery disease)     Catheterization, [place], July, 2006, EF 45%,  50% LAD, 50% RCA, 90% marginal, possible collaterals from LAD  to a branch of the PDA, medical therapy recommended  . GI bleed     2006, no significant abnormalities per the patient  . PAD (peripheral artery disease)     Arterial leg Dopplers, April, 2014, normal  . Ejection fraction     EF 45%, catheterization, 2006  //   EF 55%, echo, March, 2012, wall abnormality at the base of the inferior wall  . Aortic stenosis     severe echo 01/2013- cannot rule out bicuspid aortic valve  //   moderately severe, echo,  March, 2012 ( progression since 2009)  . RBBB   . Pulmonary emboli 2007   Significant, 2007, Coumadin therapy  . Warfarin anticoagulation     History pulmonary emboli  . Bradycardia   . Tobacco abuse   . Carotid artery disease     Doppler, March, 2012,  0-39% bilateral  . Dyslipidemia   . Heart murmur   . History of hiatal hernia   . GERD (gastroesophageal reflux disease)   . Sinus headache   . Migraine     "not very often at all" (07/13/2014)  . Chronic lower back pain     Past Surgical History  Procedure Laterality Date  . Back surgery  1988, 1990, 2006  . Cholecystectomy  1998  . Tonsillectomy    . Carpal tunnel release Left ~ 1994  . Lumbar disc surgery  1988; 1990  . Posterior lumbar fusion  2006    Family History  Problem Relation Age of Onset  . Diabetes Father   . Cancer Father     large cell lung cancer  . Heart disease Mother     Social History History  Substance Use Topics  . Smoking status: Former Smoker -- 0.50 packs/day for 20 years    Types: Cigarettes    Quit date: 07/13/2014  . Smokeless tobacco: Never Used  . Alcohol Use: 0.0 oz/week    0 Standard drinks or equivalent per week     Comment: 07/13/2014 "1 drink a month"    Current Outpatient Prescriptions  Medication Sig Dispense Refill  . acetaminophen (TYLENOL) 500  MG tablet Take 1,500 mg by mouth every 6 (six) hours as needed (pain).    Marland Kitchen aspirin EC 81 MG EC tablet Take 1 tablet (81 mg total) by mouth daily.    . Coenzyme Q10 (COQ10) 100 MG CAPS Take 1 capsule by mouth daily.     . fish oil-omega-3 fatty acids 1000 MG capsule Take 1 g by mouth daily.    . fluticasone (FLONASE) 50 MCG/ACT nasal spray Place 1 spray into both nostrils daily. 16 g 2  . ibuprofen (ADVIL,MOTRIN) 200 MG tablet Take 600 mg by mouth every 6 (six) hours as needed (pain).    . isosorbide mononitrate (IMDUR) 30 MG 24 hr tablet Take 30 mg by mouth daily.    Marland Kitchen losartan (COZAAR) 50 MG tablet Take 1 tablet (50 mg total) by mouth daily. 90 tablet 3  . warfarin (COUMADIN) 5 MG tablet TAKE AS  DIRECTED BY ANTICOAGULATION CLINIC 90 tablet 0  . atorvastatin (LIPITOR) 80 MG tablet Take 1 tablet (80 mg total) by mouth daily. (Patient not taking: Reported on 12/19/2014) 30 tablet 11  . [DISCONTINUED] LISINOPRIL PO Take by mouth daily.       No current facility-administered medications for this visit.    No Known Allergies  Review of Systems    Review of Systems  General:  No weight loss   no fever   no decreased energy  no night sweats Cardiac:  -Chest pain with exertion -- resting chest pain   -SOB with exertion -  Orthopnea                -  PND  -ankle edema-  syncope Pulmonary:  no dyspnea,no cough, no productive cough no home oxygen no hemoptysis GI: no difficulty swallowing  no GERD no jaundice  no melena  no hematemesis no        abdominal pain GU:  no dysuria  no hematuria  no frequent UTI no BPH Vascular:  no claudication  No TIA   ++varicose veins ++++ DVT Neuro:  no sroke no seizures no TIA no head trauma no vision changes, left-hand-dominant Musculoskeletal:  normal mobility no arthritis  no gout  no joint swelling Skin: no rash  no skin ulceration  no skin cancer Endocrine: diabetes  no thyroid didease Hematologic: no easy bruising  no blood transfusions  no frequent epistaxis ENT : no painful teeth no dentures no loose teeth Psych : no anxiety  no depression  no psych hospitalizations        BP 157/80 mmHg  Pulse 66  Resp 16  Ht 5\' 11"  (1.803 m)  Wt 252 lb (114.306 kg)  BMI 35.16 kg/m2  SpO2 98% Physical Exam      Physical Exam  General: obese Caucasian male no acute distress HEENT: Normocephalic pupils equal , full dental plates-edentulous Neck: Supple without JVD, adenopathy, or bruit Chest: Clear to auscultation, symmetrical breath sounds, no rhonchi, no tenderness             or deformity Cardiovascular: Regular rate and rhythm, 3/6 systolic ejection murmur, no gallop, peripheral pulses             palpable in all extremities Abdomen:  Soft,  nontender, obese,no palpable mass or organomegaly Extremities: Warm, well-perfused, no clubbing cyanosis edema or tenderness,            Moderate-severevenous stasis changes of both legs Rectal/GU: Deferred Neuro: Grossly non--focal and symmetrical throughout Skin: Clean and dry without rash or ulceration  Diagnostic Tests: Echocardiogram performed last month personally reviewed showing no severe aortic stenosis, mild AI and preserved LV function  Impression: Progression of disease and probable bicuspid aortic valve Patient will need aortic valve replacement with probable combined CABG. Prior to surgery we will obtain CT scan of his thoracic aorta for possible aneurysm. I'll see the patient back after he obtains cardiac catheterization  Plan:return for discussion of surgery and options for mechanical versus biologic valve after right-left heart cath   Len Childs, MD [facility] [phone]

## 2015-02-06 ENCOUNTER — Encounter: Payer: Self-pay | Admitting: Cardiology

## 2015-02-06 ENCOUNTER — Other Ambulatory Visit: Payer: Self-pay | Admitting: *Deleted

## 2015-02-06 ENCOUNTER — Other Ambulatory Visit: Payer: Self-pay | Admitting: Cardiology

## 2015-02-06 ENCOUNTER — Other Ambulatory Visit: Payer: Self-pay

## 2015-02-06 DIAGNOSIS — I350 Nonrheumatic aortic (valve) stenosis: Secondary | ICD-10-CM

## 2015-02-06 DIAGNOSIS — Z7901 Long term (current) use of anticoagulants: Secondary | ICD-10-CM

## 2015-02-06 DIAGNOSIS — I712 Thoracic aortic aneurysm, without rupture, unspecified: Secondary | ICD-10-CM

## 2015-02-06 DIAGNOSIS — Z01812 Encounter for preprocedural laboratory examination: Secondary | ICD-10-CM

## 2015-02-06 NOTE — H&P (Signed)
Date: 01/20/2015   ID: Chris Reyes, DOB 19-Jul-1951, MRN [id number]  PCP: Olga Millers, MD Cardiologist: Dola Argyle, MD   Chief Complaint  Patient presents with  . Appointment    Follow-up aortic stenosis     History of Present Illness: Chris Reyes is a 64 y.o. male who presents to follow-up severe/critical aortic stenosis. I saw the patient last October, 2015. He had artery seen Dr. Prescott Gum for surgical consultation for aortic valve replacement. The plan was to proceed with surgery. However the patient told me that it would have to be delayed and could not be done before January of 2016. For that reason I did not proceed with catheterization at that time. The patient has delayed further because of difficulties getting support for a family member. Most recently he was seen by Dr. Doug Sou who urged him to proceed with evaluation again. He had an echo today. He has critical aortic stenosis.  The patient tells me he still active. He says that he is still able to work in a garden. This seems unusual with the severity of disease. However I'm very concerned that he could have a sudden cardiac event with excess physical activity.  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 He provides all of her care and has not been able to find anyone to help while he has his aortic surgery.    Past Medical History  Diagnosis Date  . Hypertension   . Hernia   . COPD (chronic obstructive pulmonary disease)   . CAD (coronary artery disease)     Catheterization, [place], July, 2006, EF 45%, 50% LAD, 50% RCA, 90% marginal, possible collaterals from LAD to a branch of the PDA, medical therapy recommended  . GI bleed     2006, no significant abnormalities per the patient  . PAD (peripheral artery disease)     Arterial leg Dopplers, April, 2014, normal  . Ejection fraction     EF 45%,  catheterization, 2006 // EF 55%, echo, March, 2012, wall abnormality at the base of the inferior wall  . Aortic stenosis     severe echo 01/2013- cannot rule out bicuspid aortic valve // moderately severe, echo, March, 2012 ( progression since 2009)  . RBBB   . Pulmonary emboli 2007    Significant, 2007, Coumadin therapy  . Warfarin anticoagulation     History pulmonary emboli  . Bradycardia   . Tobacco abuse   . Carotid artery disease     Doppler, March, 2012, 0-39% bilateral  . Dyslipidemia   . Heart murmur   . History of hiatal hernia   . GERD (gastroesophageal reflux disease)   . Sinus headache   . Migraine     "not very often at all" (07/13/2014)  . Chronic lower back pain     Past Surgical History  Procedure Laterality Date  . Back surgery  1988, 1990, 2006  . Cholecystectomy  1998  . Tonsillectomy    . Carpal tunnel release Left ~ 1994  . Lumbar disc surgery  1988; 1990  . Posterior lumbar fusion  2006    Patient Active Problem List   Diagnosis Date Noted  . Encounter for therapeutic drug monitoring 01/10/2015  . Vertigo 07/13/2014  . Peripheral neuropathy 02/01/2014  . Obesity, Class II, BMI 35-39.9 02/02/2013  . Healthcare maintenance 02/02/2013  . PAD (peripheral artery disease)   . CAD (coronary artery disease)   . GI bleed   .  Ejection fraction   . Aortic stenosis   . Pulmonary emboli   . Warfarin anticoagulation   . Carotid artery disease   . RBBB   . HERNIA, UMBILICAL 01/60/1093  . TOBACCO ABUSE 09/25/2010  . Essential hypertension 09/24/2010  . COPD 09/24/2010  . BRADYCARDIA 01/26/2009  . Hyperlipidemia 06/20/2007  . CARPAL TUNNEL SYNDROME 06/20/2007      Current Outpatient Prescriptions  Medication Sig Dispense Refill  . acetaminophen (TYLENOL) 500 MG tablet Take 1,500 mg  by mouth every 6 (six) hours as needed (pain).    Marland Kitchen aspirin EC 81 MG EC tablet Take 1 tablet (81 mg total) by mouth daily.    Marland Kitchen atorvastatin (LIPITOR) 80 MG tablet Take 1 tablet (80 mg total) by mouth daily. (Patient not taking: Reported on 12/19/2014) 30 tablet 11  . Coenzyme Q10 (COQ10) 100 MG CAPS Take 1 capsule by mouth daily.     . fish oil-omega-3 fatty acids 1000 MG capsule Take 1 g by mouth daily.    . fluticasone (FLONASE) 50 MCG/ACT nasal spray Place 1 spray into both nostrils daily. 16 g 2  . ibuprofen (ADVIL,MOTRIN) 200 MG tablet Take 600 mg by mouth every 6 (six) hours as needed (pain).    . isosorbide mononitrate (IMDUR) 30 MG 24 hr tablet Take 30 mg by mouth daily.    Marland Kitchen losartan (COZAAR) 50 MG tablet Take 1 tablet (50 mg total) by mouth daily. 90 tablet 3  . warfarin (COUMADIN) 5 MG tablet TAKE AS DIRECTED BY ANTICOAGULATION CLINIC 90 tablet 0  . [DISCONTINUED] LISINOPRIL PO Take by mouth daily.      No current facility-administered medications for this visit.    Allergies: Review of patient's allergies indicates no known allergies.    Social History: The patient  reports that he quit smoking about 6 months ago. His smoking use included Cigarettes. He has a 10 pack-year smoking history. He has never used smokeless tobacco. He reports that he drinks alcohol. He reports that he does not use illicit drugs.   Family History: The patient's family history includes Cancer in his father; Diabetes in his father; Heart disease in his mother.    ROS: Please see the history of present illness. Patient denies fever, chills, headache, sweats, rash, change in vision, change in hearing, chest pain, cough, nausea or vomiting, urinary symptoms. All other systems are reviewed and are negative.    PHYSICAL EXAM: VS: BP 124/60 mmHg  Pulse 62  Ht 5\' 11"  (1.803 m)  Wt 252 lb (114.306 kg)  BMI 35.16 kg/m2  SpO2 97% , Patient is  oriented to person time and place. Affect is normal. He is really quite stable. Head is atraumatic. Sclera and conjunctiva are normal. There is no jugular venous distention. Lungs are clear. Respiratory effort is not labored. Cardiac exam reveals a harsh crescendo decrescendo systolic murmur with a late peak. This is consistent with severe aortic stenosis. The murmur radiates to both sides of his neck. Carotid upstrokes are delayed. The abdomen is soft. There is no peripheral edema. There are no musculoskeletal deformities. There are no skin rashes.  EKG: EKG is not done today.   Recent Labs: 07/15/2014: ALT 28; BUN 13; Creatinine 0.80; Potassium 3.9; Sodium 132* 07/16/2014: Hemoglobin 16.4; Platelets 161    Lipid Panel  Labs (Brief)       Component Value Date/Time   CHOL 163 04/12/2014 0520   TRIG 163* 04/12/2014 0520   HDL 26* 04/12/2014 0520   CHOLHDL 6.3  04/12/2014 0520   VLDL 33 04/12/2014 0520   LDLCALC 104* 04/12/2014 0520   LDLDIRECT 138.0 01/26/2009 1336       Wt Readings from Last 3 Encounters:  01/20/15 252 lb (114.306 kg)  12/19/14 248 lb 1.9 oz (112.546 kg)  08/01/14 247 lb (112.038 kg)      Current medicines are reviewed The patient understands his medications well.     ASSESSMENT AND PLAN:              Aortic stenosis - Carlena Bjornstad, MD at 01/20/2015 4:46 PM     Status: Chris Reyes Related Problem: Aortic stenosis   Expand All Collapse All   The patient has critical aortic stenosis. Echo today reveals a mean gradient as high as 72 mmHg. EF is maintained but he does have some focal wall motion abnormalities. The patient needs aortic valve surgery. He is in agreement. 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 He has not been able to get help to free him up for his own health care. I made it clear to him that he must move forward towards taking care  of his own health. He will be talking with his son about the possibility of coming to visit to help with his care. We are trying to help with social service advice concerning getting help in the home. He will have a follow-up appointment with Dr. Prescott Gum to reestablish and talk about the scheduling for aortic valve surgery, possibly in July. I hope to workout with Dr. Prescott Gum the optimal timing for the patient's cardiac catheterization. We will be flexible and available to try to arrange this at the appropriate time before his surgery.  As part of today's evaluation I have spent greater than 25 minutes with his total care. More than half of this time has been with direct discussion with the patient about the need to follow through with heart surgery and how we might arrange this.         Revision History       Date/Time User Action    > 01/20/2015 4:48 PM Carlena Bjornstad, MD Edit     01/20/2015 4:46 PM Carlena Bjornstad, MD Create              CAD (coronary artery disease) - Carlena Bjornstad, MD at 01/20/2015 4:46 PM     Status: Written Related Problem: CAD (coronary artery disease)   Expand All Collapse All   The patient has known coronary disease by cath in 2006. His coronaries will be reassessed before his aortic valve surgery. He will most likely require bypass in addition to his aortic valve replacement.            Warfarin anticoagulation - Carlena Bjornstad, MD at 01/20/2015 4:47 PM     Status: Written Related Problem: Warfarin anticoagulation   Expand All Collapse All   He is anticoagulated with a history of pulmonary emboli. We will help coordinate his anticoagulation around the time of cath in preparation for valve surgery.         02/06/2015 The patient has been seen by Dr. Prescott Gum. It is now time to proceed with right and left heart cath in preparation for aortic valve surgery and possible bypass. Criss Rosales will be to proceed on June 14. The patient's last  Coumadin dose will be June 7.

## 2015-02-06 NOTE — Progress Notes (Unsigned)
The patient has been seen by Dr. Prescott Gum. We will arrange right and left heart catheterization for Tuesday, June 14. He will take his last dose of Coumadin on Tuesday, June 7.  Daryel November, MD

## 2015-02-08 ENCOUNTER — Other Ambulatory Visit: Payer: Medicare Other

## 2015-02-08 ENCOUNTER — Telehealth: Payer: Self-pay | Admitting: Cardiology

## 2015-02-08 NOTE — Telephone Encounter (Signed)
New Prob    Pt has some questions regarding upcoming cardiac cath procedure. Please call.

## 2015-02-08 NOTE — Telephone Encounter (Signed)
Called patient back after 3pm as pt requested. Informed him of Dr. Ron Parker' notations below regarding the need to have someone else drive him home after the procedure and the need to be very clear what s/s to watch for and report/or/call 381 for should complications arise post-procedure. Patient verified that he did indeed STOP the Coumadin, as ordered. Patient now has further questions regarding who Dr. Lucianne Lei Tright recommended to him (the pt) to do his procedure. Informed him that we would have no way of knowing what his conversation was with Dr. Nils Pyle, however he could call Dr. Lucianne Lei Tright's office to ask him. Informed him that Dr. Tamala Julian was scheduled to do his Cardiac Cath on the 14th though. Pt is stating that he wants Dr. Tamala Julian to go in through his "wrist and not my groin". Informed him that would be up to Dr. Thompson Caul discretion. Pt asks if we could notify Dr. Tamala Julian that it is his wish/preference to do that because he spoke to Dr. Nils Pyle about that method. Advised patient that I would send his request to Dr. Tamala Julian.  Patient also asking why he needs to come to hospital two hours before the procedure because "surely they can prep me faster than that". Informed patient that the check-in/prep time does require such early arrival so that he can check-in, complete paperwork, complete insurance forms, complete consents, undergo medical and nursing assessment for patient baseline status, anesthesia assessment/consent, education, ensure all pre-procedure lab work or tests are complete and reviewed and then there is the time needed for set-up done by Dr. Tamala Julian. Patient verbalized understanding but said he didn't know why he couldn't do most of that in advance. At this time, I am going to close encounter and send Dr. Tamala Julian a staff message with the patient's request for Cardiac Cath using the "wrist".  Dr. Tamala Julian can address as appropriate/needed.

## 2015-02-08 NOTE — Telephone Encounter (Signed)
Patient rec'd instruction letter regarding scheduled Cardiac Catheterization on 02/14/15 with Dr. Tamala Julian.  Pt. voices concern over instruction to have someone to drive him to/from Cardiac Cath procedure and to remain at home with him for 24 hours post-procedure.  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 He has no other family members or neighbors that can assist him.  Reviewed by Dr. Ron Parker. He advises that 1) patient WILL need someone to drive him home after the procedure and 2) if he stopped his Coumadin correctly, as ordered, then staying at home without assistance/someone is "okay", however he needs to be very clear on discharge instructions and what s/s to call MD or 911 for in the event of bleeding, increased dyspnea/SOB or other complications.

## 2015-02-09 ENCOUNTER — Ambulatory Visit
Admission: RE | Admit: 2015-02-09 | Discharge: 2015-02-09 | Disposition: A | Discharge status: Home / Self Care | Payer: Medicare Other | Source: Ambulatory Visit | Attending: Cardiothoracic Surgery | Admitting: Cardiothoracic Surgery

## 2015-02-09 ENCOUNTER — Other Ambulatory Visit: Payer: Medicare Other

## 2015-02-09 ENCOUNTER — Telehealth: Payer: Self-pay | Admitting: *Deleted

## 2015-02-09 DIAGNOSIS — I712 Thoracic aortic aneurysm, without rupture, unspecified: Secondary | ICD-10-CM

## 2015-02-09 MED ORDER — IOPAMIDOL (ISOVUE-300) INJECTION 61%
100.0000 mL | Freq: Once | INTRAVENOUS | Status: AC | PRN
  Administered 2015-02-09: 100 mL via INTRAVENOUS

## 2015-02-09 NOTE — Telephone Encounter (Signed)
Notifited patient that Dr. Tamala Julian replied that his plan was to perform the Cardiac Cath using the "wrist".  I did advise the patient that should Dr. Tamala Julian start the procedure and determine the wrist site can not be used, he will use his discretion based on safety and effectiveness criteria to change the site. Patient verbalized understanding.

## 2015-02-09 NOTE — Telephone Encounter (Signed)
-----   Message from Belva Crome, MD sent at 02/08/2015  4:16 PM EDT ----- Regarding: RE: Cardiac Cath scheduled for 6/14 - patient question That would be my plan. ----- Message -----    From: Antonieta Iba, RN    Sent: 02/08/2015   4:07 PM      To: Belva Crome, MD Subject: Cardiac Cath scheduled for 6/14 - patient qu#  Patient request to know if you will be considering using his "wrist" for the Cardiac Cath scheduled on 6/14. He states that is his preference. Just a FYI.  Thanks!  Toni Amend, RN

## 2015-02-13 ENCOUNTER — Other Ambulatory Visit (INDEPENDENT_AMBULATORY_CARE_PROVIDER_SITE_OTHER): Payer: Medicare Other

## 2015-02-13 DIAGNOSIS — Z7901 Long term (current) use of anticoagulants: Secondary | ICD-10-CM

## 2015-02-13 DIAGNOSIS — Z01812 Encounter for preprocedural laboratory examination: Secondary | ICD-10-CM

## 2015-02-13 DIAGNOSIS — I350 Nonrheumatic aortic (valve) stenosis: Secondary | ICD-10-CM

## 2015-02-13 LAB — BASIC METABOLIC PANEL
BUN: 14 mg/dL (ref 6–23)
CHLORIDE: 102 meq/L (ref 96–112)
CO2: 31 mEq/L (ref 19–32)
Calcium: 9.5 mg/dL (ref 8.4–10.5)
Creatinine, Ser: 0.89 mg/dL (ref 0.40–1.50)
GFR: 91.21 mL/min (ref >60.00)
Glucose, Bld: 113 mg/dL — ABNORMAL HIGH (ref 70–99)
POTASSIUM: 4.6 meq/L (ref 3.5–5.1)
SODIUM: 136 meq/L (ref 135–145)

## 2015-02-13 LAB — CBC WITH DIFFERENTIAL/PLATELET
BASOS ABS: 0 10*3/uL (ref 0.0–0.1)
Basophils Relative: 0.5 % (ref 0.0–3.0)
Eosinophils Absolute: 0.2 10*3/uL (ref 0.0–0.7)
Eosinophils Relative: 1.5 % (ref 0.0–5.0)
HEMATOCRIT: 51.5 % (ref 39.0–52.0)
HEMOGLOBIN: 17.4 g/dL — AB (ref 13.0–17.0)
LYMPHS PCT: 20.8 % (ref 12.0–46.0)
Lymphs Abs: 2.1 10*3/uL (ref 0.7–4.0)
MCHC: 33.8 g/dL (ref 30.0–36.0)
MCV: 94.9 fl (ref 78.0–100.0)
MONOS PCT: 8.6 % (ref 3.0–12.0)
Monocytes Absolute: 0.9 10*3/uL (ref 0.1–1.0)
NEUTROS ABS: 7.1 10*3/uL (ref 1.4–7.7)
Neutrophils Relative %: 68.6 % (ref 43.0–77.0)
Platelets: 238 10*3/uL (ref 150.0–400.0)
RBC: 5.42 Mil/uL (ref 4.22–5.81)
RDW: 14.2 % (ref 11.5–15.5)
WBC: 10.3 10*3/uL (ref 4.0–10.5)

## 2015-02-13 LAB — PROTIME-INR
INR: 1.1 ratio — ABNORMAL HIGH (ref 0.8–1.0)
Prothrombin Time: 11.8 s (ref 9.6–13.1)

## 2015-02-14 ENCOUNTER — Encounter (HOSPITAL_COMMUNITY)
Admission: RE | Disposition: A | Discharge status: Home / Self Care | Payer: Medicare Other | Source: Ambulatory Visit | Attending: Interventional Cardiology

## 2015-02-14 ENCOUNTER — Encounter (HOSPITAL_COMMUNITY): Payer: Self-pay | Admitting: Interventional Cardiology

## 2015-02-14 ENCOUNTER — Ambulatory Visit (HOSPITAL_COMMUNITY)
Admission: RE | Admit: 2015-02-14 | Discharge: 2015-02-14 | Disposition: A | Discharge status: Home / Self Care | Payer: Medicare Other | Source: Ambulatory Visit | Attending: Interventional Cardiology | Admitting: Interventional Cardiology

## 2015-02-14 DIAGNOSIS — Z87891 Personal history of nicotine dependence: Secondary | ICD-10-CM | POA: Insufficient documentation

## 2015-02-14 DIAGNOSIS — G629 Polyneuropathy, unspecified: Secondary | ICD-10-CM | POA: Insufficient documentation

## 2015-02-14 DIAGNOSIS — K219 Gastro-esophageal reflux disease without esophagitis: Secondary | ICD-10-CM | POA: Insufficient documentation

## 2015-02-14 DIAGNOSIS — M545 Low back pain: Secondary | ICD-10-CM | POA: Diagnosis not present

## 2015-02-14 DIAGNOSIS — G8929 Other chronic pain: Secondary | ICD-10-CM | POA: Diagnosis not present

## 2015-02-14 DIAGNOSIS — I779 Disorder of arteries and arterioles, unspecified: Secondary | ICD-10-CM | POA: Diagnosis present

## 2015-02-14 DIAGNOSIS — Z7982 Long term (current) use of aspirin: Secondary | ICD-10-CM | POA: Insufficient documentation

## 2015-02-14 DIAGNOSIS — E785 Hyperlipidemia, unspecified: Secondary | ICD-10-CM | POA: Insufficient documentation

## 2015-02-14 DIAGNOSIS — Z6835 Body mass index (BMI) 35.0-35.9, adult: Secondary | ICD-10-CM | POA: Insufficient documentation

## 2015-02-14 DIAGNOSIS — G43909 Migraine, unspecified, not intractable, without status migrainosus: Secondary | ICD-10-CM | POA: Diagnosis not present

## 2015-02-14 DIAGNOSIS — E669 Obesity, unspecified: Secondary | ICD-10-CM | POA: Insufficient documentation

## 2015-02-14 DIAGNOSIS — R42 Dizziness and giddiness: Secondary | ICD-10-CM | POA: Insufficient documentation

## 2015-02-14 DIAGNOSIS — Z86711 Personal history of pulmonary embolism: Secondary | ICD-10-CM | POA: Insufficient documentation

## 2015-02-14 DIAGNOSIS — I739 Peripheral vascular disease, unspecified: Secondary | ICD-10-CM | POA: Diagnosis not present

## 2015-02-14 DIAGNOSIS — I350 Nonrheumatic aortic (valve) stenosis: Secondary | ICD-10-CM

## 2015-02-14 DIAGNOSIS — R001 Bradycardia, unspecified: Secondary | ICD-10-CM | POA: Diagnosis not present

## 2015-02-14 DIAGNOSIS — I2510 Atherosclerotic heart disease of native coronary artery without angina pectoris: Secondary | ICD-10-CM | POA: Diagnosis not present

## 2015-02-14 DIAGNOSIS — J449 Chronic obstructive pulmonary disease, unspecified: Secondary | ICD-10-CM | POA: Diagnosis not present

## 2015-02-14 DIAGNOSIS — I10 Essential (primary) hypertension: Secondary | ICD-10-CM | POA: Diagnosis not present

## 2015-02-14 DIAGNOSIS — I2582 Chronic total occlusion of coronary artery: Secondary | ICD-10-CM | POA: Insufficient documentation

## 2015-02-14 DIAGNOSIS — I4510 Unspecified right bundle-branch block: Secondary | ICD-10-CM | POA: Diagnosis not present

## 2015-02-14 DIAGNOSIS — Z7901 Long term (current) use of anticoagulants: Secondary | ICD-10-CM | POA: Insufficient documentation

## 2015-02-14 HISTORY — PX: CARDIAC CATHETERIZATION: SHX172

## 2015-02-14 LAB — PROTIME-INR
INR: 1.03 (ref 0.00–1.49)
Prothrombin Time: 13.7 seconds (ref 11.6–15.2)

## 2015-02-14 SURGERY — RIGHT/LEFT HEART CATH AND CORONARY ANGIOGRAPHY
Anesthesia: LOCAL

## 2015-02-14 MED ORDER — CEFAZOLIN SODIUM 1-5 GM-% IV SOLN
INTRAVENOUS | Status: DC | PRN
  Administered 2015-02-14: 1 g via INTRAVENOUS

## 2015-02-14 MED ORDER — SODIUM CHLORIDE 0.9 % IJ SOLN: 3.0000 mL | Freq: Two times a day (BID) | INTRAMUSCULAR | Status: DC

## 2015-02-14 MED ORDER — SODIUM CHLORIDE 0.9 % WEIGHT BASED INFUSION
3.0000 mL/kg/h | INTRAVENOUS | Status: AC
  Administered 2015-02-14: 3 mL/kg/h via INTRAVENOUS

## 2015-02-14 MED ORDER — HEPARIN (PORCINE) IN NACL 2-0.9 UNIT/ML-% IJ SOLN
INTRAMUSCULAR | Status: AC
  Filled 2015-02-14: qty 1000

## 2015-02-14 MED ORDER — SODIUM CHLORIDE 0.9 % WEIGHT BASED INFUSION: 3.0000 mL/kg/h | INTRAVENOUS | Status: AC

## 2015-02-14 MED ORDER — SODIUM CHLORIDE 0.9 % IJ SOLN: 3.0000 mL | INTRAMUSCULAR | Status: DC | PRN

## 2015-02-14 MED ORDER — VERAPAMIL HCL 2.5 MG/ML IV SOLN
INTRAVENOUS | Status: DC | PRN
  Administered 2015-02-14: 08:00:00 via INTRA_ARTERIAL

## 2015-02-14 MED ORDER — SODIUM CHLORIDE 0.9 % IV SOLN: 250.0000 mL | INTRAVENOUS | Status: DC | PRN

## 2015-02-14 MED ORDER — ACETAMINOPHEN 325 MG PO TABS: 650.0000 mg | ORAL_TABLET | ORAL | Status: DC | PRN

## 2015-02-14 MED ORDER — FENTANYL CITRATE (PF) 100 MCG/2ML IJ SOLN
INTRAMUSCULAR | Status: AC
  Filled 2015-02-14: qty 2

## 2015-02-14 MED ORDER — VERAPAMIL HCL 2.5 MG/ML IV SOLN
INTRAVENOUS | Status: AC
  Filled 2015-02-14: qty 2

## 2015-02-14 MED ORDER — ASPIRIN 81 MG PO CHEW
81.0000 mg | CHEWABLE_TABLET | ORAL | Status: AC
  Administered 2015-02-14: 81 mg via ORAL

## 2015-02-14 MED ORDER — MIDAZOLAM HCL 2 MG/2ML IJ SOLN
INTRAMUSCULAR | Status: AC
  Filled 2015-02-14: qty 2

## 2015-02-14 MED ORDER — NITROGLYCERIN 1 MG/10 ML FOR IR/CATH LAB
INTRA_ARTERIAL | Status: AC
  Filled 2015-02-14: qty 10

## 2015-02-14 MED ORDER — MIDAZOLAM HCL 2 MG/2ML IJ SOLN
INTRAMUSCULAR | Status: DC | PRN
  Administered 2015-02-14: 1 mg via INTRAVENOUS

## 2015-02-14 MED ORDER — LIDOCAINE HCL (PF) 1 % IJ SOLN
INTRAMUSCULAR | Status: DC | PRN
  Administered 2015-02-14 (×3): 5 mL via INTRADERMAL

## 2015-02-14 MED ORDER — HEPARIN SODIUM (PORCINE) 1000 UNIT/ML IJ SOLN
INTRAMUSCULAR | Status: DC | PRN
  Administered 2015-02-14: 5000 [IU] via INTRAVENOUS

## 2015-02-14 MED ORDER — SODIUM CHLORIDE 0.9 % WEIGHT BASED INFUSION: 1.0000 mL/kg/h | INTRAVENOUS | Status: DC

## 2015-02-14 MED ORDER — OXYCODONE-ACETAMINOPHEN 5-325 MG PO TABS: 1.0000 | ORAL_TABLET | ORAL | Status: DC | PRN

## 2015-02-14 MED ORDER — ONDANSETRON HCL 4 MG/2ML IJ SOLN: 4.0000 mg | Freq: Four times a day (QID) | INTRAMUSCULAR | Status: DC | PRN

## 2015-02-14 MED ORDER — CEFAZOLIN SODIUM 1-5 GM-% IV SOLN
INTRAVENOUS | Status: AC
  Filled 2015-02-14: qty 50

## 2015-02-14 MED ORDER — FENTANYL CITRATE (PF) 100 MCG/2ML IJ SOLN
INTRAMUSCULAR | Status: DC | PRN
  Administered 2015-02-14: 50 ug via INTRAVENOUS

## 2015-02-14 MED ORDER — IOHEXOL 350 MG/ML SOLN
INTRAVENOUS | Status: DC | PRN
  Administered 2015-02-14: 90 mL via INTRACARDIAC

## 2015-02-14 MED ORDER — ASPIRIN 81 MG PO CHEW
CHEWABLE_TABLET | ORAL | Status: AC
  Filled 2015-02-14: qty 1

## 2015-02-14 MED ORDER — LIDOCAINE HCL (PF) 1 % IJ SOLN
INTRAMUSCULAR | Status: AC
  Filled 2015-02-14: qty 30

## 2015-02-14 SURGICAL SUPPLY — 21 items
CATH BALLN WEDGE 5F 110CM (CATHETERS) ×1 IMPLANT
CATH INFINITI 5 FR JL3.5 (CATHETERS) ×2 IMPLANT
CATH INFINITI JR4 5F (CATHETERS) ×2 IMPLANT
CATH SITESEER 5F MULTI A 2 (CATHETERS) IMPLANT
CATH SWAN GANZ 7F STRAIGHT (CATHETERS) IMPLANT
DEVICE RAD COMP TR BAND LRG (VASCULAR PRODUCTS) ×2 IMPLANT
DEVICE TORQUE .014-.018 (MISCELLANEOUS) IMPLANT
GLIDESHEATH SLEND A-KIT 6F 22G (SHEATH) ×2 IMPLANT
GUIDEWIRE .025 260CM (WIRE) ×1 IMPLANT
KIT HEART LEFT (KITS) ×3 IMPLANT
KIT HEART RIGHT NAMIC (KITS) ×1 IMPLANT
PACK CARDIAC CATHETERIZATION (CUSTOM PROCEDURE TRAY) ×2 IMPLANT
SHEATH FAST CATH BRACH 5F 5CM (SHEATH) ×2 IMPLANT
SHEATH PINNACLE 5F 10CM (SHEATH) IMPLANT
SHEATH PINNACLE 7F 10CM (SHEATH) IMPLANT
TORQUE DEVICE .014-.018 (MISCELLANEOUS) ×2
TRANSDUCER W/STOPCOCK (MISCELLANEOUS) ×2 IMPLANT
TUBING CIL FLEX 10 FLL-RA (TUBING) ×2 IMPLANT
WIRE EMERALD 3MM-J .035X150CM (WIRE) IMPLANT
WIRE EMERALD ST .035X260CM (WIRE) ×1 IMPLANT
WIRE SAFE-T 1.5MM-J .035X260CM (WIRE) ×2 IMPLANT

## 2015-02-14 NOTE — H&P (View-Only) (Signed)
Date: 01/20/2015   ID: Chris Reyes, DOB 1951/01/13, MRN [id number]  PCP: Olga Millers, MD Cardiologist: Dola Argyle, MD   Chief Complaint  Patient presents with  . Appointment    Follow-up aortic stenosis     History of Present Illness: Chris Reyes is a 64 y.o. male who presents to follow-up severe/critical aortic stenosis. I saw the patient last October, 2015. He had artery seen Dr. Prescott Gum for surgical consultation for aortic valve replacement. The plan was to proceed with surgery. However the patient told me that it would have to be delayed and could not be done before January of 2016. For that reason I did not proceed with catheterization at that time. The patient has delayed further because of difficulties getting support for a family member. Most recently he was seen by Dr. Doug Sou who urged him to proceed with evaluation again. He had an echo today. He has critical aortic stenosis.  The patient tells me he still active. He says that he is still able to work in a garden. This seems unusual with the severity of disease. However I'm very concerned that he could have a sudden cardiac event with excess physical activity.  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 He provides all of her care and has not been able to find anyone to help while he has his aortic surgery.    Past Medical History  Diagnosis Date  . Hypertension   . Hernia   . COPD (chronic obstructive pulmonary disease)   . CAD (coronary artery disease)     Catheterization, [place], July, 2006, EF 45%, 50% LAD, 50% RCA, 90% marginal, possible collaterals from LAD to a branch of the PDA, medical therapy recommended  . GI bleed     2006, no significant abnormalities per the patient  . PAD (peripheral artery disease)     Arterial leg Dopplers, April, 2014, normal  . Ejection fraction     EF 45%,  catheterization, 2006 // EF 55%, echo, March, 2012, wall abnormality at the base of the inferior wall  . Aortic stenosis     severe echo 01/2013- cannot rule out bicuspid aortic valve // moderately severe, echo, March, 2012 ( progression since 2009)  . RBBB   . Pulmonary emboli 2007    Significant, 2007, Coumadin therapy  . Warfarin anticoagulation     History pulmonary emboli  . Bradycardia   . Tobacco abuse   . Carotid artery disease     Doppler, March, 2012, 0-39% bilateral  . Dyslipidemia   . Heart murmur   . History of hiatal hernia   . GERD (gastroesophageal reflux disease)   . Sinus headache   . Migraine     "not very often at all" (07/13/2014)  . Chronic lower back pain     Past Surgical History  Procedure Laterality Date  . Back surgery  1988, 1990, 2006  . Cholecystectomy  1998  . Tonsillectomy    . Carpal tunnel release Left ~ 1994  . Lumbar disc surgery  1988; 1990  . Posterior lumbar fusion  2006    Patient Active Problem List   Diagnosis Date Noted  . Encounter for therapeutic drug monitoring 01/10/2015  . Vertigo 07/13/2014  . Peripheral neuropathy 02/01/2014  . Obesity, Class II, BMI 35-39.9 02/02/2013  . Healthcare maintenance 02/02/2013  . PAD (peripheral artery disease)   . CAD (coronary artery disease)   . GI bleed   .  Ejection fraction   . Aortic stenosis   . Pulmonary emboli   . Warfarin anticoagulation   . Carotid artery disease   . RBBB   . HERNIA, UMBILICAL 81/19/1478  . TOBACCO ABUSE 09/25/2010  . Essential hypertension 09/24/2010  . COPD 09/24/2010  . BRADYCARDIA 01/26/2009  . Hyperlipidemia 06/20/2007  . CARPAL TUNNEL SYNDROME 06/20/2007      Current Outpatient Prescriptions  Medication Sig Dispense Refill  . acetaminophen (TYLENOL) 500 MG tablet Take 1,500 mg  by mouth every 6 (six) hours as needed (pain).    Marland Kitchen aspirin EC 81 MG EC tablet Take 1 tablet (81 mg total) by mouth daily.    Marland Kitchen atorvastatin (LIPITOR) 80 MG tablet Take 1 tablet (80 mg total) by mouth daily. (Patient not taking: Reported on 12/19/2014) 30 tablet 11  . Coenzyme Q10 (COQ10) 100 MG CAPS Take 1 capsule by mouth daily.     . fish oil-omega-3 fatty acids 1000 MG capsule Take 1 g by mouth daily.    . fluticasone (FLONASE) 50 MCG/ACT nasal spray Place 1 spray into both nostrils daily. 16 g 2  . ibuprofen (ADVIL,MOTRIN) 200 MG tablet Take 600 mg by mouth every 6 (six) hours as needed (pain).    . isosorbide mononitrate (IMDUR) 30 MG 24 hr tablet Take 30 mg by mouth daily.    Marland Kitchen losartan (COZAAR) 50 MG tablet Take 1 tablet (50 mg total) by mouth daily. 90 tablet 3  . warfarin (COUMADIN) 5 MG tablet TAKE AS DIRECTED BY ANTICOAGULATION CLINIC 90 tablet 0  . [DISCONTINUED] LISINOPRIL PO Take by mouth daily.      No current facility-administered medications for this visit.    Allergies: Review of patient's allergies indicates no known allergies.    Social History: The patient  reports that he quit smoking about 6 months ago. His smoking use included Cigarettes. He has a 10 pack-year smoking history. He has never used smokeless tobacco. He reports that he drinks alcohol. He reports that he does not use illicit drugs.   Family History: The patient's family history includes Cancer in his father; Diabetes in his father; Heart disease in his mother.    ROS: Please see the history of present illness. Patient denies fever, chills, headache, sweats, rash, change in vision, change in hearing, chest pain, cough, nausea or vomiting, urinary symptoms. All other systems are reviewed and are negative.    PHYSICAL EXAM: VS: BP 124/60 mmHg  Pulse 62  Ht 5\' 11"  (1.803 m)  Wt 252 lb (114.306 kg)  BMI 35.16 kg/m2  SpO2 97% , Patient is  oriented to person time and place. Affect is normal. He is really quite stable. Head is atraumatic. Sclera and conjunctiva are normal. There is no jugular venous distention. Lungs are clear. Respiratory effort is not labored. Cardiac exam reveals a harsh crescendo decrescendo systolic murmur with a late peak. This is consistent with severe aortic stenosis. The murmur radiates to both sides of his neck. Carotid upstrokes are delayed. The abdomen is soft. There is no peripheral edema. There are no musculoskeletal deformities. There are no skin rashes.  EKG: EKG is not done today.   Recent Labs: 07/15/2014: ALT 28; BUN 13; Creatinine 0.80; Potassium 3.9; Sodium 132* 07/16/2014: Hemoglobin 16.4; Platelets 161    Lipid Panel  Labs (Brief)       Component Value Date/Time   CHOL 163 04/12/2014 0520   TRIG 163* 04/12/2014 0520   HDL 26* 04/12/2014 0520   CHOLHDL 6.3  04/12/2014 0520   VLDL 33 04/12/2014 0520   LDLCALC 104* 04/12/2014 0520   LDLDIRECT 138.0 01/26/2009 1336       Wt Readings from Last 3 Encounters:  01/20/15 252 lb (114.306 kg)  12/19/14 248 lb 1.9 oz (112.546 kg)  08/01/14 247 lb (112.038 kg)      Current medicines are reviewed The patient understands his medications well.     ASSESSMENT AND PLAN:              Aortic stenosis - Carlena Bjornstad, MD at 01/20/2015 4:46 PM     Status: Alison Stalling Related Problem: Aortic stenosis   Expand All Collapse All   The patient has critical aortic stenosis. Echo today reveals a mean gradient as high as 72 mmHg. EF is maintained but he does have some focal wall motion abnormalities. The patient needs aortic valve surgery. He is in agreement. 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 He has not been able to get help to free him up for his own health care. I made it clear to him that he must move forward towards taking care  of his own health. He will be talking with his son about the possibility of coming to visit to help with his care. We are trying to help with social service advice concerning getting help in the home. He will have a follow-up appointment with Dr. Prescott Gum to reestablish and talk about the scheduling for aortic valve surgery, possibly in July. I hope to workout with Dr. Prescott Gum the optimal timing for the patient's cardiac catheterization. We will be flexible and available to try to arrange this at the appropriate time before his surgery.  As part of today's evaluation I have spent greater than 25 minutes with his total care. More than half of this time has been with direct discussion with the patient about the need to follow through with heart surgery and how we might arrange this.         Revision History       Date/Time User Action    > 01/20/2015 4:48 PM Carlena Bjornstad, MD Edit     01/20/2015 4:46 PM Carlena Bjornstad, MD Create              CAD (coronary artery disease) - Carlena Bjornstad, MD at 01/20/2015 4:46 PM     Status: Written Related Problem: CAD (coronary artery disease)   Expand All Collapse All   The patient has known coronary disease by cath in 2006. His coronaries will be reassessed before his aortic valve surgery. He will most likely require bypass in addition to his aortic valve replacement.            Warfarin anticoagulation - Carlena Bjornstad, MD at 01/20/2015 4:47 PM     Status: Written Related Problem: Warfarin anticoagulation   Expand All Collapse All   He is anticoagulated with a history of pulmonary emboli. We will help coordinate his anticoagulation around the time of cath in preparation for valve surgery.         02/06/2015 The patient has been seen by Dr. Prescott Gum. It is now time to proceed with right and left heart cath in preparation for aortic valve surgery and possible bypass. Criss Rosales will be to proceed on June 14. The patient's last  Coumadin dose will be June 7.

## 2015-02-14 NOTE — Discharge Instructions (Signed)
Radial Site Care °Refer to this sheet in the next few weeks. These instructions provide you with information on caring for yourself after your procedure. Your caregiver may also give you more specific instructions. Your treatment has been planned according to current medical practices, but problems sometimes occur. Call your caregiver if you have any problems or questions after your procedure. °HOME CARE INSTRUCTIONS °· You may shower the day after the procedure. Remove the bandage (dressing) and gently wash the site with plain soap and water. Gently pat the site dry. °· Do not apply powder or lotion to the site. °· Do not submerge the affected site in water for 3 to 5 days. °· Inspect the site at least twice daily. °· Do not flex or bend the affected arm for 24 hours. °· No lifting over 5 pounds (2.3 kg) for 5 days after your procedure. °· Do not drive home if you are discharged the same day of the procedure. Have someone else drive you. °· You may drive 24 hours after the procedure unless otherwise instructed by your caregiver. °· Do not operate machinery or power tools for 24 hours. °· A responsible adult should be with you for the first 24 hours after you arrive home. °What to expect: °· Any bruising will usually fade within 1 to 2 weeks. °· Blood that collects in the tissue (hematoma) may be painful to the touch. It should usually decrease in size and tenderness within 1 to 2 weeks. °SEEK IMMEDIATE MEDICAL CARE IF: °· You have unusual pain at the radial site. °· You have redness, warmth, swelling, or pain at the radial site. °· You have drainage (other than a small amount of blood on the dressing). °· You have chills. °· You have a fever or persistent symptoms for more than 72 hours. °· You have a fever and your symptoms suddenly get worse. °· Your arm becomes pale, cool, tingly, or numb. °· You have heavy bleeding from the site. Hold pressure on the site. °Document Released: 09/21/2010 Document Revised:  11/11/2011 Document Reviewed: 09/21/2010 °ExitCare® Patient Information ©2015 [employer]. This information is not intended to replace advice given to you by your health care provider. Make sure you discuss any questions you have with your health care provider. ° °

## 2015-02-14 NOTE — Interval H&P Note (Signed)
Cath Lab Visit (complete for each Cath Lab visit)  Clinical Evaluation Leading to the Procedure:   ACS: No.  Non-ACS:    Anginal Classification: CCS III  Anti-ischemic medical therapy: Minimal Therapy (1 class of medications)  Non-Invasive Test Results: No non-invasive testing performed  Prior CABG: No previous CABG      History and Physical Interval Note:  02/14/2015 7:07 AM  Laverda Sorenson  has presented today for surgery, with the diagnosis of sob  The various methods of treatment have been discussed with the patient and family. After consideration of risks, benefits and other options for treatment, the patient has consented to  Procedure(s): Right/Left Heart Cath and Coronary Angiography (N/A) as a surgical intervention .  The patient's history has been reviewed, patient examined, no change in status, stable for surgery.  I have reviewed the patient's chart and labs.  Questions were answered to the patient's satisfaction.     Sinclair Grooms

## 2015-02-15 LAB — POCT I-STAT 3, ART BLOOD GAS (G3+)
ACID-BASE DEFICIT: 2 mmol/L (ref 0.0–2.0)
BICARBONATE: 22.8 meq/L (ref 20.0–24.0)
O2 Saturation: 94 %
TCO2: 24 mmol/L (ref 0–100)
pCO2 arterial: 39.6 mmHg (ref 35.0–45.0)
pH, Arterial: 7.368 (ref 7.350–7.450)
pO2, Arterial: 75 mmHg — ABNORMAL LOW (ref 80.0–100.0)

## 2015-02-15 LAB — POCT I-STAT 3, VENOUS BLOOD GAS (G3P V)
Acid-base deficit: 2 mmol/L (ref 0.0–2.0)
Bicarbonate: 24.5 mEq/L — ABNORMAL HIGH (ref 20.0–24.0)
O2 SAT: 64 %
TCO2: 26 mmol/L (ref 0–100)
pCO2, Ven: 45.7 mmHg (ref 45.0–50.0)
pH, Ven: 7.337 — ABNORMAL HIGH (ref 7.250–7.300)
pO2, Ven: 35 mmHg (ref 30.0–45.0)

## 2015-02-15 MED FILL — Heparin Sodium (Porcine) 2 Unit/ML in Sodium Chloride 0.9%: INTRAMUSCULAR | Qty: 1000 | Status: AC

## 2015-02-18 ENCOUNTER — Other Ambulatory Visit: Payer: Self-pay | Admitting: Internal Medicine

## 2015-02-20 ENCOUNTER — Other Ambulatory Visit: Payer: Self-pay | Admitting: General Practice

## 2015-02-20 MED ORDER — WARFARIN SODIUM 5 MG PO TABS: ORAL_TABLET | ORAL | Status: AC

## 2015-02-21 ENCOUNTER — Ambulatory Visit (INDEPENDENT_AMBULATORY_CARE_PROVIDER_SITE_OTHER): Payer: Medicare Other | Admitting: General Practice

## 2015-02-21 DIAGNOSIS — I2699 Other pulmonary embolism without acute cor pulmonale: Secondary | ICD-10-CM

## 2015-02-21 DIAGNOSIS — Z5181 Encounter for therapeutic drug level monitoring: Secondary | ICD-10-CM | POA: Diagnosis not present

## 2015-02-21 LAB — POCT INR: INR: 1.6

## 2015-02-21 NOTE — Progress Notes (Signed)
Pre visit review using our clinic review tool, if applicable. No additional management support is needed unless otherwise documented below in the visit note. 

## 2015-02-21 NOTE — Progress Notes (Signed)
I have reviewed and agree with the plan. 

## 2015-02-22 ENCOUNTER — Other Ambulatory Visit: Payer: Self-pay | Admitting: Internal Medicine

## 2015-02-22 ENCOUNTER — Other Ambulatory Visit: Payer: Self-pay | Admitting: *Deleted

## 2015-02-22 ENCOUNTER — Ambulatory Visit (INDEPENDENT_AMBULATORY_CARE_PROVIDER_SITE_OTHER): Payer: Medicare Other | Admitting: Cardiothoracic Surgery

## 2015-02-22 VITALS — BP 141/75 | HR 58 | Resp 16 | Ht 71.0 in | Wt 252.0 lb

## 2015-02-22 DIAGNOSIS — I77810 Thoracic aortic ectasia: Secondary | ICD-10-CM

## 2015-02-22 DIAGNOSIS — I712 Thoracic aortic aneurysm, without rupture: Secondary | ICD-10-CM

## 2015-02-22 DIAGNOSIS — I350 Nonrheumatic aortic (valve) stenosis: Secondary | ICD-10-CM | POA: Diagnosis not present

## 2015-02-22 DIAGNOSIS — I2510 Atherosclerotic heart disease of native coronary artery without angina pectoris: Secondary | ICD-10-CM

## 2015-02-23 NOTE — Progress Notes (Signed)
PCP is Olga Millers, MD Referring Provider is Carlena Bjornstad, MD  Chief Complaint  Patient presents with  . Follow-up    2 wks after CT CHEST and CATH    PTW:SFKCLEX returns for further discussion of his severe cardiac disease-severe aortic bicuspid stenosis and multivessel coronary disease  Since the patient's last visit he has undergone right and left heart cardiac catheterization.this demonstrated a 90% stenosis of the dominant RCA, 75% stenosis of the diagonal, 90% stenosis of the circumflex and PA pressures of 33/7 with a wedge of 20 and LVEDP23 mmHg with cardiac index 1.9 and Peak aortic gradient 74 mmHg   procedure was performed via right radial artery and her no complications or hematoma.  The patient previously has had all his teeth excised.  The patient is stop smoking. He denies any active pulmonary symptoms.  The patient wishes to have surgery scheduled in late July so that his son can be present to help care for his invalid wife. We discussed the scheduled date of surgery-July 25 and that he will stop his Coumadin for history of DVT-pulmonary emboli 5 days prior to surgery. He will have vein mapping completed prior to his surgery.  Past Medical History  Diagnosis Date  . Hypertension   . Hernia   . COPD (chronic obstructive pulmonary disease)   . CAD (coronary artery disease)     Catheterization, [place], July, 2006, EF 45%,  50% LAD, 50% RCA, 90% marginal, possible collaterals from LAD  to a branch of the PDA, medical therapy recommended  . GI bleed     2006, no significant abnormalities per the patient  . PAD (peripheral artery disease)     Arterial leg Dopplers, April, 2014, normal  . Ejection fraction     EF 45%, catheterization, 2006  //   EF 55%, echo, March, 2012, wall abnormality at the base of the inferior wall  . Aortic stenosis     severe echo 01/2013- cannot rule out bicuspid aortic valve  //   moderately severe, echo,  March, 2012 ( progression  since 2009)  . RBBB   . Pulmonary emboli 2007    Significant, 2007, Coumadin therapy  . Warfarin anticoagulation     History pulmonary emboli  . Bradycardia   . Tobacco abuse   . Carotid artery disease     Doppler, March, 2012,  0-39% bilateral  . Dyslipidemia   . Heart murmur   . History of hiatal hernia   . GERD (gastroesophageal reflux disease)   . Sinus headache   . Migraine     "not very often at all" (07/13/2014)  . Chronic lower back pain     Past Surgical History  Procedure Laterality Date  . Back surgery  1988, 1990, 2006  . Cholecystectomy  1998  . Tonsillectomy    . Carpal tunnel release Left ~ 1994  . Lumbar disc surgery  1988; 1990  . Posterior lumbar fusion  2006  . Cardiac catheterization N/A 02/14/2015    Procedure: Right/Left Heart Cath and Coronary Angiography;  Surgeon: Belva Crome, MD;  Location: [facility];  Service: Cardiovascular;  Laterality: N/A;    Family History  Problem Relation Age of Onset  . Diabetes Father   . Cancer Father     large cell lung cancer  . Heart disease Mother     Social History History  Substance Use Topics  . Smoking status: Former Smoker -- 0.50 packs/day for 20 years  Types: Cigarettes    Quit date: 07/13/2014  . Smokeless tobacco: Never Used  . Alcohol Use: 0.0 oz/week    0 Standard drinks or equivalent per week     Comment: 07/13/2014 "1 drink a month"    Current Outpatient Prescriptions  Medication Sig Dispense Refill  . aspirin EC 81 MG EC tablet Take 1 tablet (81 mg total) by mouth daily.    . Coenzyme Q10 (COQ10) 100 MG CAPS Take 1 capsule by mouth daily.     . fish oil-omega-3 fatty acids 1000 MG capsule Take 1 g by mouth daily.    . fluticasone (FLONASE) 50 MCG/ACT nasal spray Place 1 spray into both nostrils daily. 16 g 2  . ibuprofen (ADVIL,MOTRIN) 200 MG tablet Take 600 mg by mouth every 6 (six) hours as needed (pain).    . isosorbide mononitrate (IMDUR) 30 MG 24 hr tablet Take 30 mg by  mouth daily.    Marland Kitchen losartan (COZAAR) 50 MG tablet TAKE 1 TABLET (50 MG TOTAL) BY MOUTH DAILY. 90 tablet 3  . warfarin (COUMADIN) 5 MG tablet TAKE AS DIRECTED BY ANTICOAGULATION CLINIC 90 tablet 0  . [DISCONTINUED] LISINOPRIL PO Take by mouth daily.       No current facility-administered medications for this visit.    No Known Allergies  Review of Systems   Review of Systems No significant changes since last visit  General:  No weight loss   no fever   no decreased energy  no night sweats Cardiac:  -Chest pain with exertion - resting chest pain   +SOB with exertion -  Orthopnea                  PND  ankle edema  syncope Pulmonary:  no dyspnea,no cough, no productive cough no home oxygen no hemoptysis GI: no difficulty swallowing  no GERD no jaundice  no melena  no hematemesis no        abdominal pain GU:  no dysuria  no hematuria  no frequent UTI no BPH Vascular:  no claudication  No TIA +++varicose veins +hxDVT Neuro:  no sroke no seizures no TIA no head trauma no vision changes Musculoskeletal:  normal mobility no arthritis  no gout  no joint swelling Skin: no rash  no skin ulceration  no skin cancer Endocrine: diabetes  no thyroid didease Hematologic: no easy bruising  no blood transfusions  no frequent epistaxis ENT : no painful teeth no dentures no loose teeth Psych : no anxiety  no depression no psych hospitalizations      \  BP 141/75 mmHg  Pulse 58  Resp 16  Ht 5\' 11"  (1.803 m)  Wt 252 lb (114.306 kg)  BMI 35.16 kg/m2  SpO2 98% Physical Exam      Physical Exam  General: middle-aged Caucasian male who appears older than his stated age 64: Normocephalic pupils equal , dentition adequate Neck: Supple without JVD, adenopathy, or bruit Chest: Clear to auscultation, symmetrical breath sounds, no rhonchi, no tenderness             or deformity Cardiovascular: Regular rate and rhythm, 4/6 aortic stenosis murmur, no gallop, peripheral pulses             palpable in  all extremities-severe bilateral varicose veins below the knees Abdomen:  Soft, nontender, no palpable mass or organomegaly Extremities: Warm, well-perfused, no clubbing cyanosis edema or tenderness,             ++venous stasis  changes of the legs Rectal/GU: Deferred Neuro: Grossly non--focal and symmetrical throughout Skin: Clean and dry without rash or ulceration  Diagnostic Tests: Results of cardiac cath discuss with patient in detail. The patient understands he will need aortic valve replacement with a bioprosthetic valve. CT scan of the chest showed no evidence of aneurysmal dilatation of the ascending aorta, also no evidence of pulmonary nodules or mass. Will plan on bypass grafts to the diagonal, circumflex, and RCA. Vein harvest may be problematic and the patient will receive vein mapping prior to surgery.  Impression: Severe AS, significant multivessel CAD Moderate LV dysfunction Plan:tissue aVR with combined CABG schedule for July 25 at [facility] hospital   Len Childs, MD [facility] [phone]

## 2015-03-21 ENCOUNTER — Ambulatory Visit (HOSPITAL_COMMUNITY)
Admission: RE | Admit: 2015-03-21 | Discharge: 2015-03-21 | Disposition: A | Discharge status: Home / Self Care | Payer: Medicare Other | Source: Ambulatory Visit | Attending: Cardiothoracic Surgery | Admitting: Cardiothoracic Surgery

## 2015-03-21 ENCOUNTER — Ambulatory Visit: Payer: Medicare Other

## 2015-03-21 ENCOUNTER — Ambulatory Visit: Payer: Medicare Other | Admitting: Cardiology

## 2015-03-21 DIAGNOSIS — Z01818 Encounter for other preprocedural examination: Secondary | ICD-10-CM | POA: Diagnosis present

## 2015-03-21 DIAGNOSIS — I2510 Atherosclerotic heart disease of native coronary artery without angina pectoris: Secondary | ICD-10-CM | POA: Diagnosis not present

## 2015-03-21 DIAGNOSIS — Z87891 Personal history of nicotine dependence: Secondary | ICD-10-CM | POA: Diagnosis not present

## 2015-03-21 LAB — PULMONARY FUNCTION TEST
DL/VA % pred: 101 %
DL/VA: 4.74 ml/min/mmHg/L
DLCO unc % pred: 85 %
DLCO unc: 28.94 ml/min/mmHg
FEF 25-75 Post: 2.46 L/sec
FEF 25-75 Pre: 2.48 L/sec
FEF2575-%Change-Post: 0 %
FEF2575-%Pred-Post: 87 %
FEF2575-%Pred-Pre: 87 %
FEV1-%Change-Post: -1 %
FEV1-%Pred-Post: 86 %
FEV1-%Pred-Pre: 87 %
FEV1-Post: 3.1 L
FEV1-Pre: 3.13 L
FEV1FVC-%Change-Post: 3 %
FEV1FVC-%Pred-Pre: 101 %
FEV6-%Change-Post: -3 %
FEV6-%Pred-Post: 87 %
FEV6-%Pred-Pre: 90 %
FEV6-Post: 3.95 L
FEV6-Pre: 4.11 L
FEV6FVC-%Change-Post: 0 %
FEV6FVC-%Pred-Post: 105 %
FEV6FVC-%Pred-Pre: 104 %
FVC-%Change-Post: -4 %
FVC-%Pred-Post: 82 %
FVC-%Pred-Pre: 86 %
FVC-Post: 3.96 L
FVC-Pre: 4.13 L
Post FEV1/FVC ratio: 78 %
Post FEV6/FVC ratio: 100 %
Pre FEV1/FVC ratio: 76 %
Pre FEV6/FVC Ratio: 100 %
RV % pred: 114 %
RV: 2.75 L
TLC % pred: 97 %
TLC: 7.05 L

## 2015-03-21 MED ORDER — ALBUTEROL SULFATE (2.5 MG/3ML) 0.083% IN NEBU
2.5000 mg | INHALATION_SOLUTION | Freq: Once | RESPIRATORY_TRACT | Status: AC
  Administered 2015-03-21: 2.5 mg via RESPIRATORY_TRACT

## 2015-03-22 ENCOUNTER — Encounter (HOSPITAL_COMMUNITY): Payer: Self-pay | Admitting: Pharmacy Technician

## 2015-03-23 ENCOUNTER — Encounter (HOSPITAL_COMMUNITY): Payer: Self-pay

## 2015-03-23 ENCOUNTER — Ambulatory Visit (HOSPITAL_BASED_OUTPATIENT_CLINIC_OR_DEPARTMENT_OTHER)
Admission: RE | Admit: 2015-03-23 | Discharge: 2015-03-23 | Disposition: A | Discharge status: Home / Self Care | Payer: Medicare Other | Source: Ambulatory Visit | Attending: Cardiothoracic Surgery | Admitting: Cardiothoracic Surgery

## 2015-03-23 ENCOUNTER — Encounter (HOSPITAL_COMMUNITY)
Admission: RE | Admit: 2015-03-23 | Discharge: 2015-03-23 | Disposition: A | Discharge status: Home / Self Care | Payer: Medicare Other | Source: Ambulatory Visit | Attending: Cardiothoracic Surgery | Admitting: Cardiothoracic Surgery

## 2015-03-23 ENCOUNTER — Ambulatory Visit (HOSPITAL_COMMUNITY)
Admission: RE | Admit: 2015-03-23 | Discharge: 2015-03-23 | Disposition: A | Discharge status: Home / Self Care | Payer: Medicare Other | Source: Ambulatory Visit | Attending: Cardiothoracic Surgery | Admitting: Cardiothoracic Surgery

## 2015-03-23 ENCOUNTER — Other Ambulatory Visit (HOSPITAL_COMMUNITY): Payer: Medicare Other

## 2015-03-23 VITALS — BP 127/73 | HR 73 | Temp 98.7°F | Resp 20 | Ht 71.0 in | Wt 252.6 lb

## 2015-03-23 DIAGNOSIS — J449 Chronic obstructive pulmonary disease, unspecified: Secondary | ICD-10-CM | POA: Insufficient documentation

## 2015-03-23 DIAGNOSIS — I10 Essential (primary) hypertension: Secondary | ICD-10-CM | POA: Insufficient documentation

## 2015-03-23 DIAGNOSIS — Z87891 Personal history of nicotine dependence: Secondary | ICD-10-CM | POA: Diagnosis not present

## 2015-03-23 DIAGNOSIS — I2510 Atherosclerotic heart disease of native coronary artery without angina pectoris: Secondary | ICD-10-CM | POA: Diagnosis not present

## 2015-03-23 DIAGNOSIS — Z0183 Encounter for blood typing: Secondary | ICD-10-CM | POA: Insufficient documentation

## 2015-03-23 DIAGNOSIS — Z7901 Long term (current) use of anticoagulants: Secondary | ICD-10-CM | POA: Diagnosis not present

## 2015-03-23 DIAGNOSIS — Z79899 Other long term (current) drug therapy: Secondary | ICD-10-CM | POA: Diagnosis not present

## 2015-03-23 DIAGNOSIS — Z01812 Encounter for preprocedural laboratory examination: Secondary | ICD-10-CM | POA: Diagnosis not present

## 2015-03-23 DIAGNOSIS — I350 Nonrheumatic aortic (valve) stenosis: Secondary | ICD-10-CM | POA: Diagnosis not present

## 2015-03-23 DIAGNOSIS — Z01818 Encounter for other preprocedural examination: Secondary | ICD-10-CM | POA: Insufficient documentation

## 2015-03-23 DIAGNOSIS — I4510 Unspecified right bundle-branch block: Secondary | ICD-10-CM | POA: Insufficient documentation

## 2015-03-23 HISTORY — DX: Reserved for inherently not codable concepts without codable children: IMO0001

## 2015-03-23 HISTORY — DX: Personal history of other diseases of the digestive system: Z87.19

## 2015-03-23 HISTORY — DX: Personal history of pneumonia (recurrent): Z87.01

## 2015-03-23 LAB — URINALYSIS, ROUTINE W REFLEX MICROSCOPIC
Bilirubin Urine: NEGATIVE
Glucose, UA: NEGATIVE mg/dL
Hgb urine dipstick: NEGATIVE
Ketones, ur: NEGATIVE mg/dL
Leukocytes, UA: NEGATIVE
Nitrite: NEGATIVE
Protein, ur: NEGATIVE mg/dL
Specific Gravity, Urine: 1.019 (ref 1.005–1.030)
Urobilinogen, UA: 1 mg/dL (ref 0.0–1.0)
pH: 6 (ref 5.0–8.0)

## 2015-03-23 LAB — ABO/RH: ABO/RH(D): O POS

## 2015-03-23 LAB — BLOOD GAS, ARTERIAL
Acid-Base Excess: 1 mmol/L (ref 0.0–2.0)
Bicarbonate: 24.3 mEq/L — ABNORMAL HIGH (ref 20.0–24.0)
Drawn by: 428831
FIO2: 0.21 %
O2 Saturation: 98.9 %
Patient temperature: 98.6
TCO2: 25.3 mmol/L (ref 0–100)
pCO2 arterial: 33.1 mmHg — ABNORMAL LOW (ref 35.0–45.0)
pH, Arterial: 7.478 — ABNORMAL HIGH (ref 7.350–7.450)
pO2, Arterial: 111 mmHg — ABNORMAL HIGH (ref 80.0–100.0)

## 2015-03-23 LAB — COMPREHENSIVE METABOLIC PANEL
ALT: 19 U/L (ref 17–63)
AST: 21 U/L (ref 15–41)
Albumin: 3.6 g/dL (ref 3.5–5.0)
Alkaline Phosphatase: 51 U/L (ref 38–126)
Anion gap: 8 (ref 5–15)
BUN: 14 mg/dL (ref 6–20)
CO2: 25 mmol/L (ref 22–32)
Calcium: 9 mg/dL (ref 8.9–10.3)
Chloride: 103 mmol/L (ref 101–111)
Creatinine, Ser: 0.95 mg/dL (ref 0.61–1.24)
GFR calc Af Amer: >60 mL/min (ref >60)
GFR calc non Af Amer: >60 mL/min (ref >60)
Glucose, Bld: 113 mg/dL — ABNORMAL HIGH (ref 65–99)
Potassium: 4.1 mmol/L (ref 3.5–5.1)
Sodium: 136 mmol/L (ref 135–145)
Total Bilirubin: 1 mg/dL (ref 0.3–1.2)
Total Protein: 7.4 g/dL (ref 6.5–8.1)

## 2015-03-23 LAB — CBC
HCT: 44.9 % (ref 39.0–52.0)
Hemoglobin: 16.4 g/dL (ref 13.0–17.0)
MCH: 33.2 pg (ref 26.0–34.0)
MCHC: 36.5 g/dL — ABNORMAL HIGH (ref 30.0–36.0)
MCV: 90.9 fL (ref 78.0–100.0)
Platelets: 101 10*3/uL — ABNORMAL LOW (ref 150–400)
RBC: 4.94 MIL/uL (ref 4.22–5.81)
RDW: 13.2 % (ref 11.5–15.5)
WBC: 9.5 10*3/uL (ref 4.0–10.5)

## 2015-03-23 LAB — PROTIME-INR
INR: 1.13 (ref 0.00–1.49)
Prothrombin Time: 14.6 seconds (ref 11.6–15.2)

## 2015-03-23 LAB — SURGICAL PCR SCREEN
MRSA, PCR: NEGATIVE
Staphylococcus aureus: NEGATIVE

## 2015-03-23 LAB — APTT: aPTT: 28 seconds (ref 24–37)

## 2015-03-23 MED ORDER — CHLORHEXIDINE GLUCONATE 4 % EX LIQD: 30.0000 mL | CUTANEOUS | Status: DC

## 2015-03-23 NOTE — Progress Notes (Addendum)
VASCULAR LAB PRELIMINARY  PRELIMINARY  PRELIMINARY  PRELIMINARY  Right Lower Extremity Vein Map    Right Great Saphenous Vein   Segment Diameter  1. Origin 9.34mm  2. High Thigh 4.51mm  3. Mid Thigh 4.43mm  4. Low Thigh 5.86mm  5. At Knee 3.78mm  6. High Calf 4.87mm  7. Low Calf 3.0 mm with branch  8. Ankle 3.9 mm with branches     Left Lower Extremity Vein Map    Left Great Saphenous Vein   Segment Diameter  1. Origin 6.61mm  2. High Thigh 4.49mm  3. Mid Thigh 4.54mm  4. Low Thigh 3.66mm  5. At Knee 2.60mm  6. High Calf 3.9mm  7. Low Calf 2.55mm  8. Ankle 3.45mm with branch   All areas of the vein bilateally were thrombus free and compressible. The greater saphenous vein runs quite medial in the thigh bilaterally.    Zinnia Tindall, RVS 03/23/2015, 4:20 PM

## 2015-03-23 NOTE — Progress Notes (Signed)
   03/23/15 1344  OBSTRUCTIVE SLEEP APNEA  Have you ever been diagnosed with sleep apnea through a sleep study? No  Do you snore loudly (loud enough to be heard through closed doors)?  0  Do you often feel tired, fatigued, or sleepy during the daytime? 0  Has anyone observed you stop breathing during your sleep? 0  Do you have, or are you being treated for high blood pressure? 1  BMI more than 35 kg/m2? 1  Age over 65 years old? 1  Neck circumference greater than 40 cm/16 inches? 1  Gender: 1  Obstructive Sleep Apnea Score 5

## 2015-03-23 NOTE — Progress Notes (Signed)
VASCULAR LAB PRELIMINARY  PRELIMINARY  PRELIMINARY  PRELIMINARY  Pre-op Cardiac Surgery  Carotid Findings:  Right :  40-59% internal carotid artery stenosis.  Left : 1% to 39% ICA stenosis. Bilateral : Vertebral artery flow is antegrade.  Upper Extremity Right Left  Brachial Pressures 141 Triphasic 115 Triphasic  Radial Waveforms Triphasic Triphasic  Ulnar Waveforms Triphasic Triphasic  Palmar Arch (Allen's Test) Normal Normal   Findings:  DFoppler waveforms remained normal bilaterally with both radial and ulnar compressions    Lower  Extremity Right Left  Dorsalis Pedis 114 Monophasic 110 Monophasic  Posterior Tibial 147 Biphasic 127 Triphasic  Ankle/Brachial Indices 1.04 0.82    Findings:  ABIs are within normal limits on the right and indicate a mild reduction in arterial flow on the left   Chris Reyes, RVS 03/23/2015, 3:27 PM

## 2015-03-23 NOTE — Progress Notes (Signed)
PCP- Dr. Vertell Novak  Cardiologist - Dr. Ron Parker EKG- 03/23/15 - Epic CXR- 03/23/15 - Epic  Echo- 01/20/15 - Epic Cardiac Cath - 02/14/15 - Epic

## 2015-03-23 NOTE — Pre-Procedure Instructions (Addendum)
    Chris Reyes  03/23/2015      [id number] - [place], [place] - [address] [place] [address] Phone: [phone] Fax: [phone]    Your procedure is scheduled on Monday, July 25th, 2016 at 7:30 AM.  Report to [place] at 5:30 A.M.  Call this number if you have problems the morning of surgery:  [phone]   Remember:  Do not eat food or drink liquids after midnight.   Take these medicines the morning of surgery with A SIP OF WATER: Isosorbide Mononitrate (Imdur), Fluticasone (Flonase)  Stop taking: Aspirin, NSAIDs, Ibuprofen, Naproxen, Aleve, BC's, Goody's, fish oil, all herbal medications, and all vitamins.    Do not wear jewelry.  Do not wear lotions, powders, or colognes.  You may NOT wear deodorant.  Men may shave face and neck.  Do not bring valuables to the hospital.  [facility] is not responsible for any belongings or valuables.  Contacts, dentures or bridgework may not be worn into surgery.  Leave your suitcase in the car.  After surgery it may be brought to your room.  For patients admitted to the hospital, discharge time will be determined by your treatment team.  Patients discharged the day of surgery will not be allowed to drive home.   Special instructions:  See attached.   Please read over the following fact sheets that you were given. Pain Booklet, Coughing and Deep Breathing, Blood Transfusion Information, Open Heart Packet, MRSA Information and Surgical Site Infection Prevention

## 2015-03-24 LAB — HEMOGLOBIN A1C
Hgb A1c MFr Bld: 5.6 % (ref 4.8–5.6)
Mean Plasma Glucose: 114 mg/dL

## 2015-03-26 MED ORDER — SODIUM CHLORIDE 0.9 % IV SOLN
INTRAVENOUS | Status: AC
  Administered 2015-03-27: 1.6 [IU]/h via INTRAVENOUS
  Filled 2015-03-26: qty 2.5

## 2015-03-26 MED ORDER — DEXTROSE 5 % IV SOLN
750.0000 mg | INTRAVENOUS | Status: DC
  Filled 2015-03-26: qty 750

## 2015-03-26 MED ORDER — DEXMEDETOMIDINE HCL IN NACL 400 MCG/100ML IV SOLN
0.1000 ug/kg/h | INTRAVENOUS | Status: AC
  Administered 2015-03-27: .3 ug/kg/h via INTRAVENOUS
  Administered 2015-03-27: 12:00:00 via INTRAVENOUS
  Filled 2015-03-26: qty 100

## 2015-03-26 MED ORDER — PHENYLEPHRINE HCL 10 MG/ML IJ SOLN
30.0000 ug/min | INTRAMUSCULAR | Status: AC
  Administered 2015-03-27: 10 ug/min via INTRAVENOUS
  Filled 2015-03-26: qty 2

## 2015-03-26 MED ORDER — MAGNESIUM SULFATE 50 % IJ SOLN
40.0000 meq | INTRAMUSCULAR | Status: DC
  Filled 2015-03-26: qty 10

## 2015-03-26 MED ORDER — NITROGLYCERIN IN D5W 200-5 MCG/ML-% IV SOLN
2.0000 ug/min | INTRAVENOUS | Status: AC
  Administered 2015-03-27: 5 ug/min via INTRAVENOUS
  Filled 2015-03-26: qty 250

## 2015-03-26 MED ORDER — SODIUM CHLORIDE 0.9 % IV SOLN
INTRAVENOUS | Status: DC
  Filled 2015-03-26: qty 30

## 2015-03-26 MED ORDER — VANCOMYCIN HCL 10 G IV SOLR
1500.0000 mg | INTRAVENOUS | Status: AC
  Administered 2015-03-27: 1500 mg via INTRAVENOUS
  Filled 2015-03-26: qty 1500

## 2015-03-26 MED ORDER — POTASSIUM CHLORIDE 2 MEQ/ML IV SOLN
80.0000 meq | INTRAVENOUS | Status: DC
  Filled 2015-03-26: qty 40

## 2015-03-26 MED ORDER — CEFUROXIME SODIUM 1.5 G IJ SOLR
1.5000 g | INTRAMUSCULAR | Status: AC
  Administered 2015-03-27: 1.5 g via INTRAVENOUS
  Administered 2015-03-27: .75 g via INTRAVENOUS
  Filled 2015-03-26 (×2): qty 1.5

## 2015-03-26 MED ORDER — PLASMA-LYTE 148 IV SOLN
INTRAVENOUS | Status: AC
  Administered 2015-03-27: 500 mL
  Filled 2015-03-26: qty 2.5

## 2015-03-26 MED ORDER — METOPROLOL TARTRATE 12.5 MG HALF TABLET
12.5000 mg | ORAL_TABLET | Freq: Once | ORAL | Status: AC
  Administered 2015-03-27: 12.5 mg via ORAL
  Filled 2015-03-26: qty 1

## 2015-03-26 MED ORDER — DOPAMINE-DEXTROSE 3.2-5 MG/ML-% IV SOLN
0.0000 ug/kg/min | INTRAVENOUS | Status: AC
  Administered 2015-03-27: 2 ug/kg/min via INTRAVENOUS
  Filled 2015-03-26: qty 250

## 2015-03-26 MED ORDER — EPINEPHRINE HCL 1 MG/ML IJ SOLN
0.0000 ug/min | INTRAVENOUS | Status: DC
  Filled 2015-03-26: qty 4

## 2015-03-26 MED ORDER — SODIUM CHLORIDE 0.9 % IV SOLN
INTRAVENOUS | Status: AC
  Administered 2015-03-27: 12:00:00 via INTRAVENOUS
  Administered 2015-03-27: 69.8 mL/h via INTRAVENOUS
  Filled 2015-03-26: qty 40

## 2015-03-27 ENCOUNTER — Inpatient Hospital Stay (HOSPITAL_COMMUNITY): Payer: Medicare Other

## 2015-03-27 ENCOUNTER — Inpatient Hospital Stay (HOSPITAL_COMMUNITY): Payer: Medicare Other | Admitting: Anesthesiology

## 2015-03-27 ENCOUNTER — Encounter (HOSPITAL_COMMUNITY): Admission: RE | Disposition: E | Payer: Medicare Other | Source: Ambulatory Visit | Attending: Cardiothoracic Surgery

## 2015-03-27 ENCOUNTER — Encounter (HOSPITAL_COMMUNITY): Payer: Self-pay | Admitting: *Deleted

## 2015-03-27 ENCOUNTER — Inpatient Hospital Stay (HOSPITAL_COMMUNITY)
Admission: RE | Admit: 2015-03-27 | Discharge: 2015-04-03 | DRG: 219 | Disposition: E | Payer: Medicare Other | Source: Ambulatory Visit | Attending: Cardiothoracic Surgery | Admitting: Cardiothoracic Surgery

## 2015-03-27 DIAGNOSIS — J9600 Acute respiratory failure, unspecified whether with hypoxia or hypercapnia: Secondary | ICD-10-CM | POA: Diagnosis not present

## 2015-03-27 DIAGNOSIS — R791 Abnormal coagulation profile: Secondary | ICD-10-CM | POA: Diagnosis not present

## 2015-03-27 DIAGNOSIS — E11649 Type 2 diabetes mellitus with hypoglycemia without coma: Secondary | ICD-10-CM | POA: Diagnosis not present

## 2015-03-27 DIAGNOSIS — I350 Nonrheumatic aortic (valve) stenosis: Secondary | ICD-10-CM | POA: Diagnosis present

## 2015-03-27 DIAGNOSIS — E1165 Type 2 diabetes mellitus with hyperglycemia: Secondary | ICD-10-CM | POA: Diagnosis not present

## 2015-03-27 DIAGNOSIS — Z515 Encounter for palliative care: Secondary | ICD-10-CM | POA: Diagnosis not present

## 2015-03-27 DIAGNOSIS — I5042 Chronic combined systolic (congestive) and diastolic (congestive) heart failure: Secondary | ICD-10-CM | POA: Diagnosis present

## 2015-03-27 DIAGNOSIS — K219 Gastro-esophageal reflux disease without esophagitis: Secondary | ICD-10-CM | POA: Diagnosis present

## 2015-03-27 DIAGNOSIS — I352 Nonrheumatic aortic (valve) stenosis with insufficiency: Principal | ICD-10-CM | POA: Diagnosis present

## 2015-03-27 DIAGNOSIS — N179 Acute kidney failure, unspecified: Secondary | ICD-10-CM | POA: Diagnosis not present

## 2015-03-27 DIAGNOSIS — J9601 Acute respiratory failure with hypoxia: Secondary | ICD-10-CM | POA: Diagnosis not present

## 2015-03-27 DIAGNOSIS — E876 Hypokalemia: Secondary | ICD-10-CM | POA: Diagnosis not present

## 2015-03-27 DIAGNOSIS — K5720 Diverticulitis of large intestine with perforation and abscess without bleeding: Secondary | ICD-10-CM | POA: Diagnosis present

## 2015-03-27 DIAGNOSIS — N170 Acute kidney failure with tubular necrosis: Secondary | ICD-10-CM | POA: Diagnosis not present

## 2015-03-27 DIAGNOSIS — I469 Cardiac arrest, cause unspecified: Secondary | ICD-10-CM | POA: Diagnosis not present

## 2015-03-27 DIAGNOSIS — I25110 Atherosclerotic heart disease of native coronary artery with unstable angina pectoris: Secondary | ICD-10-CM | POA: Diagnosis not present

## 2015-03-27 DIAGNOSIS — Y95 Nosocomial condition: Secondary | ICD-10-CM | POA: Diagnosis present

## 2015-03-27 DIAGNOSIS — A419 Sepsis, unspecified organism: Secondary | ICD-10-CM | POA: Diagnosis not present

## 2015-03-27 DIAGNOSIS — I739 Peripheral vascular disease, unspecified: Secondary | ICD-10-CM | POA: Diagnosis present

## 2015-03-27 DIAGNOSIS — J690 Pneumonitis due to inhalation of food and vomit: Secondary | ICD-10-CM | POA: Diagnosis not present

## 2015-03-27 DIAGNOSIS — J189 Pneumonia, unspecified organism: Secondary | ICD-10-CM | POA: Diagnosis not present

## 2015-03-27 DIAGNOSIS — Z951 Presence of aortocoronary bypass graft: Secondary | ICD-10-CM

## 2015-03-27 DIAGNOSIS — R579 Shock, unspecified: Secondary | ICD-10-CM | POA: Insufficient documentation

## 2015-03-27 DIAGNOSIS — Z87891 Personal history of nicotine dependence: Secondary | ICD-10-CM

## 2015-03-27 DIAGNOSIS — I2510 Atherosclerotic heart disease of native coronary artery without angina pectoris: Secondary | ICD-10-CM | POA: Diagnosis present

## 2015-03-27 DIAGNOSIS — Z952 Presence of prosthetic heart valve: Secondary | ICD-10-CM

## 2015-03-27 DIAGNOSIS — Z931 Gastrostomy status: Secondary | ICD-10-CM

## 2015-03-27 DIAGNOSIS — I481 Persistent atrial fibrillation: Secondary | ICD-10-CM | POA: Diagnosis not present

## 2015-03-27 DIAGNOSIS — R6521 Severe sepsis with septic shock: Secondary | ICD-10-CM | POA: Diagnosis not present

## 2015-03-27 DIAGNOSIS — Z4682 Encounter for fitting and adjustment of non-vascular catheter: Secondary | ICD-10-CM

## 2015-03-27 DIAGNOSIS — R1110 Vomiting, unspecified: Secondary | ICD-10-CM

## 2015-03-27 DIAGNOSIS — Z86718 Personal history of other venous thrombosis and embolism: Secondary | ICD-10-CM

## 2015-03-27 DIAGNOSIS — R069 Unspecified abnormalities of breathing: Secondary | ICD-10-CM

## 2015-03-27 DIAGNOSIS — R34 Anuria and oliguria: Secondary | ICD-10-CM | POA: Diagnosis not present

## 2015-03-27 DIAGNOSIS — Z6834 Body mass index (BMI) 34.0-34.9, adult: Secondary | ICD-10-CM

## 2015-03-27 DIAGNOSIS — J449 Chronic obstructive pulmonary disease, unspecified: Secondary | ICD-10-CM | POA: Diagnosis present

## 2015-03-27 DIAGNOSIS — Z954 Presence of other heart-valve replacement: Secondary | ICD-10-CM | POA: Diagnosis not present

## 2015-03-27 DIAGNOSIS — K9189 Other postprocedural complications and disorders of digestive system: Secondary | ICD-10-CM

## 2015-03-27 DIAGNOSIS — E669 Obesity, unspecified: Secondary | ICD-10-CM | POA: Diagnosis present

## 2015-03-27 DIAGNOSIS — E872 Acidosis: Secondary | ICD-10-CM | POA: Diagnosis not present

## 2015-03-27 DIAGNOSIS — Z86711 Personal history of pulmonary embolism: Secondary | ICD-10-CM

## 2015-03-27 DIAGNOSIS — K659 Peritonitis, unspecified: Secondary | ICD-10-CM | POA: Diagnosis not present

## 2015-03-27 DIAGNOSIS — K913 Postprocedural intestinal obstruction: Secondary | ICD-10-CM | POA: Diagnosis not present

## 2015-03-27 DIAGNOSIS — Z452 Encounter for adjustment and management of vascular access device: Secondary | ICD-10-CM

## 2015-03-27 DIAGNOSIS — R0602 Shortness of breath: Secondary | ICD-10-CM | POA: Insufficient documentation

## 2015-03-27 DIAGNOSIS — Z9911 Dependence on respirator [ventilator] status: Secondary | ICD-10-CM

## 2015-03-27 DIAGNOSIS — K567 Ileus, unspecified: Secondary | ICD-10-CM | POA: Insufficient documentation

## 2015-03-27 HISTORY — PX: TEE WITHOUT CARDIOVERSION: SHX5443

## 2015-03-27 HISTORY — PX: AORTIC VALVE REPLACEMENT: SHX41

## 2015-03-27 HISTORY — PX: CORONARY ARTERY BYPASS GRAFT: SHX141

## 2015-03-27 LAB — POCT I-STAT 3, ART BLOOD GAS (G3+)
Acid-base deficit: 1 mmol/L (ref 0.0–2.0)
Acid-base deficit: 3 mmol/L — ABNORMAL HIGH (ref 0.0–2.0)
Acid-base deficit: 2 mmol/L (ref 0.0–2.0)
Acid-base deficit: 1 mmol/L (ref 0.0–2.0)
Acid-base deficit: 3 mmol/L — ABNORMAL HIGH (ref 0.0–2.0)
Bicarbonate: 25.4 mEq/L — ABNORMAL HIGH (ref 20.0–24.0)
Bicarbonate: 22.9 mEq/L (ref 20.0–24.0)
Bicarbonate: 23.9 mEq/L (ref 20.0–24.0)
Bicarbonate: 23.5 mEq/L (ref 20.0–24.0)
Bicarbonate: 22.2 mEq/L (ref 20.0–24.0)
O2 Saturation: 100 %
O2 Saturation: 99 %
O2 Saturation: 89 %
O2 Saturation: 95 %
O2 Saturation: 93 %
Patient temperature: 36.3
Patient temperature: 37.3
Patient temperature: 37.6
TCO2: 27 mmol/L (ref 0–100)
TCO2: 24 mmol/L (ref 0–100)
TCO2: 25 mmol/L (ref 0–100)
TCO2: 25 mmol/L (ref 0–100)
TCO2: 23 mmol/L (ref 0–100)
pCO2 arterial: 50.9 mmHg — ABNORMAL HIGH (ref 35.0–45.0)
pCO2 arterial: 42.5 mmHg (ref 35.0–45.0)
pCO2 arterial: 43.6 mmHg (ref 35.0–45.0)
pCO2 arterial: 39.3 mmHg (ref 35.0–45.0)
pCO2 arterial: 40.9 mmHg (ref 35.0–45.0)
pH, Arterial: 7.307 — ABNORMAL LOW (ref 7.350–7.450)
pH, Arterial: 7.34 — ABNORMAL LOW (ref 7.350–7.450)
pH, Arterial: 7.344 — ABNORMAL LOW (ref 7.350–7.450)
pH, Arterial: 7.386 (ref 7.350–7.450)
pH, Arterial: 7.346 — ABNORMAL LOW (ref 7.350–7.450)
pO2, Arterial: 288 mmHg — ABNORMAL HIGH (ref 80.0–100.0)
pO2, Arterial: 162 mmHg — ABNORMAL HIGH (ref 80.0–100.0)
pO2, Arterial: 57 mmHg — ABNORMAL LOW (ref 80.0–100.0)
pO2, Arterial: 76 mmHg — ABNORMAL LOW (ref 80.0–100.0)
pO2, Arterial: 74 mmHg — ABNORMAL LOW (ref 80.0–100.0)

## 2015-03-27 LAB — GLUCOSE, CAPILLARY
Glucose-Capillary: 116 mg/dL — ABNORMAL HIGH (ref 65–99)
Glucose-Capillary: 113 mg/dL — ABNORMAL HIGH (ref 65–99)
Glucose-Capillary: 135 mg/dL — ABNORMAL HIGH (ref 65–99)
Glucose-Capillary: 138 mg/dL — ABNORMAL HIGH (ref 65–99)

## 2015-03-27 LAB — POCT I-STAT GLUCOSE
Glucose, Bld: 147 mg/dL — ABNORMAL HIGH (ref 65–99)
Operator id: 3406

## 2015-03-27 LAB — POCT I-STAT, CHEM 8
BUN: 16 mg/dL (ref 6–20)
BUN: 14 mg/dL (ref 6–20)
BUN: 13 mg/dL (ref 6–20)
BUN: 15 mg/dL (ref 6–20)
BUN: 14 mg/dL (ref 6–20)
BUN: 13 mg/dL (ref 6–20)
Calcium, Ion: 1.27 mmol/L (ref 1.13–1.30)
Calcium, Ion: 1.23 mmol/L (ref 1.13–1.30)
Calcium, Ion: 1.04 mmol/L — ABNORMAL LOW (ref 1.13–1.30)
Calcium, Ion: 1.12 mmol/L — ABNORMAL LOW (ref 1.13–1.30)
Calcium, Ion: 1.19 mmol/L (ref 1.13–1.30)
Calcium, Ion: 1.16 mmol/L (ref 1.13–1.30)
Chloride: 101 mmol/L (ref 101–111)
Chloride: 101 mmol/L (ref 101–111)
Chloride: 96 mmol/L — ABNORMAL LOW (ref 101–111)
Chloride: 102 mmol/L (ref 101–111)
Chloride: 102 mmol/L (ref 101–111)
Chloride: 103 mmol/L (ref 101–111)
Creatinine, Ser: 0.7 mg/dL (ref 0.61–1.24)
Creatinine, Ser: 0.6 mg/dL — ABNORMAL LOW (ref 0.61–1.24)
Creatinine, Ser: 0.7 mg/dL (ref 0.61–1.24)
Creatinine, Ser: 0.8 mg/dL (ref 0.61–1.24)
Creatinine, Ser: 0.7 mg/dL (ref 0.61–1.24)
Creatinine, Ser: 0.7 mg/dL (ref 0.61–1.24)
Glucose, Bld: 138 mg/dL — ABNORMAL HIGH (ref 65–99)
Glucose, Bld: 123 mg/dL — ABNORMAL HIGH (ref 65–99)
Glucose, Bld: 112 mg/dL — ABNORMAL HIGH (ref 65–99)
Glucose, Bld: 162 mg/dL — ABNORMAL HIGH (ref 65–99)
Glucose, Bld: 140 mg/dL — ABNORMAL HIGH (ref 65–99)
Glucose, Bld: 193 mg/dL — ABNORMAL HIGH (ref 65–99)
HCT: 43 % (ref 39.0–52.0)
HCT: 42 % (ref 39.0–52.0)
HCT: 33 % — ABNORMAL LOW (ref 39.0–52.0)
HCT: 35 % — ABNORMAL LOW (ref 39.0–52.0)
HCT: 34 % — ABNORMAL LOW (ref 39.0–52.0)
HCT: 43 % (ref 39.0–52.0)
Hemoglobin: 14.6 g/dL (ref 13.0–17.0)
Hemoglobin: 14.3 g/dL (ref 13.0–17.0)
Hemoglobin: 11.2 g/dL — ABNORMAL LOW (ref 13.0–17.0)
Hemoglobin: 11.9 g/dL — ABNORMAL LOW (ref 13.0–17.0)
Hemoglobin: 11.6 g/dL — ABNORMAL LOW (ref 13.0–17.0)
Hemoglobin: 14.6 g/dL (ref 13.0–17.0)
Potassium: 4.2 mmol/L (ref 3.5–5.1)
Potassium: 4.3 mmol/L (ref 3.5–5.1)
Potassium: 4.3 mmol/L (ref 3.5–5.1)
Potassium: 5.5 mmol/L — ABNORMAL HIGH (ref 3.5–5.1)
Potassium: 4.5 mmol/L (ref 3.5–5.1)
Potassium: 4 mmol/L (ref 3.5–5.1)
Sodium: 137 mmol/L (ref 135–145)
Sodium: 137 mmol/L (ref 135–145)
Sodium: 134 mmol/L — ABNORMAL LOW (ref 135–145)
Sodium: 134 mmol/L — ABNORMAL LOW (ref 135–145)
Sodium: 134 mmol/L — ABNORMAL LOW (ref 135–145)
Sodium: 136 mmol/L (ref 135–145)
TCO2: 26 mmol/L (ref 0–100)
TCO2: 26 mmol/L (ref 0–100)
TCO2: 26 mmol/L (ref 0–100)
TCO2: 23 mmol/L (ref 0–100)
TCO2: 21 mmol/L (ref 0–100)
TCO2: 20 mmol/L (ref 0–100)

## 2015-03-27 LAB — CBC
HCT: 42.4 % (ref 39.0–52.0)
HCT: 41.1 % (ref 39.0–52.0)
Hemoglobin: 14.8 g/dL (ref 13.0–17.0)
Hemoglobin: 14.4 g/dL (ref 13.0–17.0)
MCH: 32.5 pg (ref 26.0–34.0)
MCH: 32.4 pg (ref 26.0–34.0)
MCHC: 34.9 g/dL (ref 30.0–36.0)
MCHC: 35 g/dL (ref 30.0–36.0)
MCV: 93.2 fL (ref 78.0–100.0)
MCV: 92.4 fL (ref 78.0–100.0)
Platelets: 121 10*3/uL — ABNORMAL LOW (ref 150–400)
Platelets: 120 10*3/uL — ABNORMAL LOW (ref 150–400)
RBC: 4.55 MIL/uL (ref 4.22–5.81)
RBC: 4.45 MIL/uL (ref 4.22–5.81)
RDW: 13.3 % (ref 11.5–15.5)
RDW: 13.2 % (ref 11.5–15.5)
WBC: 20.8 10*3/uL — ABNORMAL HIGH (ref 4.0–10.5)
WBC: 15 10*3/uL — ABNORMAL HIGH (ref 4.0–10.5)

## 2015-03-27 LAB — CREATININE, SERUM
Creatinine, Ser: 0.84 mg/dL (ref 0.61–1.24)
GFR calc Af Amer: >60 mL/min (ref >60)
GFR calc non Af Amer: >60 mL/min (ref >60)

## 2015-03-27 LAB — POCT I-STAT 4, (NA,K, GLUC, HGB,HCT)
Glucose, Bld: 131 mg/dL — ABNORMAL HIGH (ref 65–99)
HCT: 43 % (ref 39.0–52.0)
Hemoglobin: 14.6 g/dL (ref 13.0–17.0)
Potassium: 4.2 mmol/L (ref 3.5–5.1)
Sodium: 137 mmol/L (ref 135–145)

## 2015-03-27 LAB — PROTIME-INR
INR: 1.47 (ref 0.00–1.49)
Prothrombin Time: 17.9 seconds — ABNORMAL HIGH (ref 11.6–15.2)

## 2015-03-27 LAB — HEMOGLOBIN AND HEMATOCRIT, BLOOD
HCT: 34.5 % — ABNORMAL LOW (ref 39.0–52.0)
Hemoglobin: 12.2 g/dL — ABNORMAL LOW (ref 13.0–17.0)

## 2015-03-27 LAB — APTT: aPTT: 31 seconds (ref 24–37)

## 2015-03-27 LAB — PLATELET COUNT: Platelets: 124 10*3/uL — ABNORMAL LOW (ref 150–400)

## 2015-03-27 LAB — MAGNESIUM: Magnesium: 2.5 mg/dL — ABNORMAL HIGH (ref 1.7–2.4)

## 2015-03-27 SURGERY — REPLACEMENT, AORTIC VALVE, OPEN
Anesthesia: General | Site: Chest

## 2015-03-27 MED ORDER — DEXTROSE 5 % IV SOLN
1.5000 g | Freq: Two times a day (BID) | INTRAVENOUS | Status: AC
  Administered 2015-03-28 – 2015-03-29 (×4): 1.5 g via INTRAVENOUS
  Filled 2015-03-27 (×4): qty 1.5

## 2015-03-27 MED ORDER — DEXMEDETOMIDINE HCL IN NACL 200 MCG/50ML IV SOLN
0.0000 ug/kg/h | INTRAVENOUS | Status: DC
  Administered 2015-03-27: 0.5 ug/kg/h via INTRAVENOUS
  Administered 2015-03-27: 0.7 ug/kg/h via INTRAVENOUS
  Filled 2015-03-27: qty 50

## 2015-03-27 MED ORDER — MIDAZOLAM HCL 5 MG/5ML IJ SOLN
INTRAMUSCULAR | Status: DC | PRN
  Administered 2015-03-27 (×6): 1 mg via INTRAVENOUS
  Administered 2015-03-27: 2 mg via INTRAVENOUS
  Administered 2015-03-27 (×2): 1 mg via INTRAVENOUS

## 2015-03-27 MED ORDER — VECURONIUM BROMIDE 10 MG IV SOLR
INTRAVENOUS | Status: DC | PRN
Start: 2015-03-27 — End: 2015-03-27
  Administered 2015-03-27: 10 mg via INTRAVENOUS
  Administered 2015-03-27 (×2): 4 mg via INTRAVENOUS
  Administered 2015-03-27: 10 mg via INTRAVENOUS

## 2015-03-27 MED ORDER — METOPROLOL TARTRATE 12.5 MG HALF TABLET
12.5000 mg | ORAL_TABLET | Freq: Two times a day (BID) | ORAL | Status: DC
  Administered 2015-03-28 – 2015-03-31 (×6): 12.5 mg via ORAL
  Filled 2015-03-27 (×9): qty 1

## 2015-03-27 MED ORDER — POTASSIUM CHLORIDE 10 MEQ/50ML IV SOLN: 10.0000 meq | INTRAVENOUS | Status: AC

## 2015-03-27 MED ORDER — BISACODYL 5 MG PO TBEC
10.0000 mg | DELAYED_RELEASE_TABLET | Freq: Every day | ORAL | Status: DC
  Administered 2015-03-28 – 2015-03-30 (×3): 10 mg via ORAL
  Filled 2015-03-27 (×3): qty 2

## 2015-03-27 MED ORDER — PROTAMINE SULFATE 10 MG/ML IV SOLN
INTRAVENOUS | Status: AC
  Filled 2015-03-27: qty 5

## 2015-03-27 MED ORDER — FLUTICASONE PROPIONATE 50 MCG/ACT NA SUSP
1.0000 | Freq: Every day | NASAL | Status: DC
  Administered 2015-03-29 – 2015-03-31 (×2): 1 via NASAL
  Filled 2015-03-27: qty 16

## 2015-03-27 MED ORDER — MILRINONE IN DEXTROSE 20 MG/100ML IV SOLN
0.1250 ug/kg/min | INTRAVENOUS | Status: DC
  Administered 2015-03-28: 0.3 ug/kg/min via INTRAVENOUS
  Administered 2015-03-29: 0.125 ug/kg/min via INTRAVENOUS
  Filled 2015-03-27 (×4): qty 100

## 2015-03-27 MED ORDER — HEMOSTATIC AGENTS (NO CHARGE) OPTIME
TOPICAL | Status: DC | PRN
  Administered 2015-03-27 (×2): 1 via TOPICAL

## 2015-03-27 MED ORDER — SODIUM CHLORIDE 0.45 % IV SOLN
INTRAVENOUS | Status: DC | PRN
  Administered 2015-03-27: 15:00:00 via INTRAVENOUS

## 2015-03-27 MED ORDER — FENTANYL CITRATE (PF) 250 MCG/5ML IJ SOLN
INTRAMUSCULAR | Status: AC
  Filled 2015-03-27: qty 5

## 2015-03-27 MED ORDER — ALBUMIN HUMAN 5 % IV SOLN
INTRAVENOUS | Status: DC | PRN
  Administered 2015-03-27: 13:00:00 via INTRAVENOUS

## 2015-03-27 MED ORDER — SODIUM CHLORIDE 0.9 % IV SOLN
INTRAVENOUS | Status: DC
  Administered 2015-03-27: 15:00:00 via INTRAVENOUS
  Administered 2015-03-28: 20 mL/h via INTRAVENOUS

## 2015-03-27 MED ORDER — ACETAMINOPHEN 160 MG/5ML PO SOLN: 1000.0000 mg | Freq: Four times a day (QID) | ORAL | Status: DC

## 2015-03-27 MED ORDER — ASPIRIN EC 325 MG PO TBEC
325.0000 mg | DELAYED_RELEASE_TABLET | Freq: Every day | ORAL | Status: DC
  Administered 2015-03-29 – 2015-03-31 (×3): 325 mg via ORAL
  Filled 2015-03-27 (×4): qty 1

## 2015-03-27 MED ORDER — CALCIUM CHLORIDE 10 % IV SOLN
INTRAVENOUS | Status: DC | PRN
  Administered 2015-03-27: 300 mg via INTRAVENOUS

## 2015-03-27 MED ORDER — METOPROLOL TARTRATE 25 MG/10 ML ORAL SUSPENSION
12.5000 mg | Freq: Two times a day (BID) | ORAL | Status: DC
  Filled 2015-03-27 (×9): qty 5

## 2015-03-27 MED ORDER — FENTANYL CITRATE (PF) 250 MCG/5ML IJ SOLN
INTRAMUSCULAR | Status: DC | PRN
  Administered 2015-03-27 (×2): 150 ug via INTRAVENOUS
  Administered 2015-03-27: 100 ug via INTRAVENOUS
  Administered 2015-03-27: 250 ug via INTRAVENOUS
  Administered 2015-03-27: 50 ug via INTRAVENOUS
  Administered 2015-03-27: 100 ug via INTRAVENOUS
  Administered 2015-03-27: 250 ug via INTRAVENOUS
  Administered 2015-03-27: 150 ug via INTRAVENOUS
  Administered 2015-03-27: 50 ug via INTRAVENOUS
  Administered 2015-03-27: 150 ug via INTRAVENOUS
  Administered 2015-03-27: 100 ug via INTRAVENOUS

## 2015-03-27 MED ORDER — TRAMADOL HCL 50 MG PO TABS
50.0000 mg | ORAL_TABLET | ORAL | Status: DC | PRN
  Administered 2015-03-28 – 2015-03-31 (×5): 100 mg via ORAL
  Filled 2015-03-27 (×5): qty 2

## 2015-03-27 MED ORDER — PROPOFOL 10 MG/ML IV BOLUS
INTRAVENOUS | Status: DC | PRN
  Administered 2015-03-27: 140 mg via INTRAVENOUS
  Administered 2015-03-27: 30 mg via INTRAVENOUS

## 2015-03-27 MED ORDER — MIDAZOLAM HCL 2 MG/2ML IJ SOLN: 2.0000 mg | INTRAMUSCULAR | Status: DC | PRN

## 2015-03-27 MED ORDER — ACETAMINOPHEN 650 MG RE SUPP
650.0000 mg | Freq: Once | RECTAL | Status: AC
  Administered 2015-03-27: 650 mg via RECTAL

## 2015-03-27 MED ORDER — ONDANSETRON HCL 4 MG/2ML IJ SOLN
4.0000 mg | Freq: Four times a day (QID) | INTRAMUSCULAR | Status: DC | PRN
  Administered 2015-03-30 (×2): 4 mg via INTRAVENOUS
  Filled 2015-03-27 (×3): qty 2

## 2015-03-27 MED ORDER — SUCCINYLCHOLINE CHLORIDE 20 MG/ML IJ SOLN
INTRAMUSCULAR | Status: AC
  Filled 2015-03-27: qty 1

## 2015-03-27 MED ORDER — DEXMEDETOMIDINE HCL IN NACL 200 MCG/50ML IV SOLN
0.4000 ug/kg/h | INTRAVENOUS | Status: AC
Start: 2015-03-27 — End: 2015-03-28
  Filled 2015-03-27 (×2): qty 50

## 2015-03-27 MED ORDER — SODIUM CHLORIDE 0.9 % IV SOLN
INTRAVENOUS | Status: DC | PRN
  Administered 2015-03-27: 13:00:00 via INTRAVENOUS

## 2015-03-27 MED ORDER — EPHEDRINE SULFATE 50 MG/ML IJ SOLN
INTRAMUSCULAR | Status: AC
  Filled 2015-03-27: qty 1

## 2015-03-27 MED ORDER — ALBUMIN HUMAN 5 % IV SOLN
250.0000 mL | INTRAVENOUS | Status: AC | PRN
  Administered 2015-03-27 (×2): 250 mL via INTRAVENOUS

## 2015-03-27 MED ORDER — CHLORHEXIDINE GLUCONATE 0.12 % MT SOLN
15.0000 mL | Freq: Two times a day (BID) | OROMUCOSAL | Status: DC
  Administered 2015-03-27 – 2015-03-29 (×5): 15 mL via OROMUCOSAL
  Filled 2015-03-27 (×4): qty 15

## 2015-03-27 MED ORDER — METOPROLOL TARTRATE 1 MG/ML IV SOLN
2.5000 mg | INTRAVENOUS | Status: DC | PRN
  Administered 2015-03-31: 2.5 mg via INTRAVENOUS
  Filled 2015-03-27: qty 5

## 2015-03-27 MED ORDER — SODIUM CHLORIDE 0.9 % IJ SOLN
INTRAMUSCULAR | Status: DC | PRN
  Administered 2015-03-27 (×3): 4 mL via TOPICAL

## 2015-03-27 MED ORDER — BISACODYL 10 MG RE SUPP: 10.0000 mg | Freq: Every day | RECTAL | Status: DC

## 2015-03-27 MED ORDER — NITROGLYCERIN IN D5W 200-5 MCG/ML-% IV SOLN: 0.0000 ug/min | INTRAVENOUS | Status: DC

## 2015-03-27 MED ORDER — LACTATED RINGERS IV SOLN: 500.0000 mL | Freq: Once | INTRAVENOUS | Status: DC | PRN

## 2015-03-27 MED ORDER — GLYCOPYRROLATE 0.2 MG/ML IJ SOLN
INTRAMUSCULAR | Status: AC
  Filled 2015-03-27: qty 2

## 2015-03-27 MED ORDER — MILRINONE IN DEXTROSE 20 MG/100ML IV SOLN
0.3750 ug/kg/min | INTRAVENOUS | Status: DC
  Filled 2015-03-27: qty 100

## 2015-03-27 MED ORDER — PHENYLEPHRINE 40 MCG/ML (10ML) SYRINGE FOR IV PUSH (FOR BLOOD PRESSURE SUPPORT)
PREFILLED_SYRINGE | INTRAVENOUS | Status: AC
  Filled 2015-03-27: qty 10

## 2015-03-27 MED ORDER — PLASMA-LYTE 148 IV SOLN
INTRAVENOUS | Status: AC
  Administered 2015-03-27: 500 mL
  Filled 2015-03-27: qty 2.5

## 2015-03-27 MED ORDER — DOPAMINE-DEXTROSE 3.2-5 MG/ML-% IV SOLN: 0.0000 ug/kg/min | INTRAVENOUS | Status: DC

## 2015-03-27 MED ORDER — CETYLPYRIDINIUM CHLORIDE 0.05 % MT LIQD
7.0000 mL | Freq: Four times a day (QID) | OROMUCOSAL | Status: DC
  Administered 2015-03-28 – 2015-03-29 (×6): 7 mL via OROMUCOSAL

## 2015-03-27 MED ORDER — VANCOMYCIN HCL IN DEXTROSE 1-5 GM/200ML-% IV SOLN
1000.0000 mg | Freq: Two times a day (BID) | INTRAVENOUS | Status: AC
  Administered 2015-03-27 – 2015-03-28 (×3): 1000 mg via INTRAVENOUS
  Filled 2015-03-27 (×3): qty 200

## 2015-03-27 MED ORDER — SODIUM CHLORIDE 0.9 % IJ SOLN: 10.0000 mL | INTRAMUSCULAR | Status: DC | PRN

## 2015-03-27 MED ORDER — HEPARIN SODIUM (PORCINE) 1000 UNIT/ML IJ SOLN
INTRAMUSCULAR | Status: AC
  Filled 2015-03-27: qty 1

## 2015-03-27 MED ORDER — PROTAMINE SULFATE 10 MG/ML IV SOLN
INTRAVENOUS | Status: DC | PRN
  Administered 2015-03-27 (×2): 40 mg via INTRAVENOUS
  Administered 2015-03-27 (×2): 20 mg via INTRAVENOUS
  Administered 2015-03-27: 40 mg via INTRAVENOUS
  Administered 2015-03-27: 30 mg via INTRAVENOUS
  Administered 2015-03-27: 20 mg via INTRAVENOUS
  Administered 2015-03-27: 40 mg via INTRAVENOUS

## 2015-03-27 MED ORDER — VANCOMYCIN HCL IN DEXTROSE 1-5 GM/200ML-% IV SOLN
1000.0000 mg | Freq: Once | INTRAVENOUS | Status: DC
  Filled 2015-03-27: qty 200

## 2015-03-27 MED ORDER — BUDESONIDE-FORMOTEROL FUMARATE 160-4.5 MCG/ACT IN AERO
2.0000 | INHALATION_SPRAY | Freq: Two times a day (BID) | RESPIRATORY_TRACT | Status: DC
  Administered 2015-03-28 – 2015-03-30 (×5): 2 via RESPIRATORY_TRACT
  Filled 2015-03-27: qty 6

## 2015-03-27 MED ORDER — FAMOTIDINE IN NACL 20-0.9 MG/50ML-% IV SOLN
20.0000 mg | Freq: Two times a day (BID) | INTRAVENOUS | Status: AC
  Administered 2015-03-27 (×2): 20 mg via INTRAVENOUS
  Filled 2015-03-27: qty 50

## 2015-03-27 MED ORDER — GLYCOPYRROLATE 0.2 MG/ML IJ SOLN
INTRAMUSCULAR | Status: DC | PRN
  Administered 2015-03-27 (×2): 0.2 mg via INTRAVENOUS

## 2015-03-27 MED ORDER — PROPOFOL 10 MG/ML IV BOLUS
INTRAVENOUS | Status: AC
  Filled 2015-03-27: qty 20

## 2015-03-27 MED ORDER — SODIUM CHLORIDE 0.9 % IJ SOLN
INTRAMUSCULAR | Status: AC
Start: 2015-03-27 — End: 2015-03-27
  Filled 2015-03-27: qty 10

## 2015-03-27 MED ORDER — MILRINONE LOAD VIA INFUSION
INTRAVENOUS | Status: DC | PRN
  Administered 2015-03-27: 5700 ug via INTRAVENOUS

## 2015-03-27 MED ORDER — MILRINONE IN DEXTROSE 20 MG/100ML IV SOLN
INTRAVENOUS | Status: DC | PRN
  Administered 2015-03-27: .3 ug/kg/min via INTRAVENOUS

## 2015-03-27 MED ORDER — LACTATED RINGERS IV SOLN: INTRAVENOUS | Status: DC

## 2015-03-27 MED ORDER — MORPHINE SULFATE 2 MG/ML IJ SOLN
2.0000 mg | INTRAMUSCULAR | Status: DC | PRN
  Administered 2015-03-28: 2 mg via INTRAVENOUS
  Administered 2015-03-28: 4 mg via INTRAVENOUS
  Administered 2015-03-28: 2 mg via INTRAVENOUS
  Administered 2015-03-28 (×2): 4 mg via INTRAVENOUS
  Filled 2015-03-27 (×2): qty 1
  Filled 2015-03-27 (×3): qty 2

## 2015-03-27 MED ORDER — METOCLOPRAMIDE HCL 5 MG/ML IJ SOLN
10.0000 mg | Freq: Four times a day (QID) | INTRAMUSCULAR | Status: AC
  Administered 2015-03-27 – 2015-03-29 (×7): 10 mg via INTRAVENOUS
  Filled 2015-03-27 (×8): qty 2

## 2015-03-27 MED ORDER — ROCURONIUM BROMIDE 50 MG/5ML IV SOLN
INTRAVENOUS | Status: AC
  Filled 2015-03-27: qty 1

## 2015-03-27 MED ORDER — SODIUM CHLORIDE 0.9 % IV SOLN
INTRAVENOUS | Status: DC
  Administered 2015-03-27: 21:00:00 via INTRAVENOUS
  Filled 2015-03-27: qty 2.5

## 2015-03-27 MED ORDER — LIDOCAINE HCL (CARDIAC) 20 MG/ML IV SOLN
INTRAVENOUS | Status: AC
  Filled 2015-03-27: qty 5

## 2015-03-27 MED ORDER — VECURONIUM BROMIDE 10 MG IV SOLR
INTRAVENOUS | Status: AC
  Filled 2015-03-27: qty 10

## 2015-03-27 MED ORDER — DOCUSATE SODIUM 100 MG PO CAPS
200.0000 mg | ORAL_CAPSULE | Freq: Every day | ORAL | Status: DC
  Administered 2015-03-28 – 2015-03-30 (×3): 200 mg via ORAL
  Filled 2015-03-27 (×3): qty 2

## 2015-03-27 MED ORDER — SODIUM CHLORIDE 0.9 % IJ SOLN
3.0000 mL | INTRAMUSCULAR | Status: DC | PRN
  Administered 2015-03-27: 3 mL via INTRAVENOUS
  Filled 2015-03-27: qty 3

## 2015-03-27 MED ORDER — DEXMEDETOMIDINE HCL IN NACL 200 MCG/50ML IV SOLN
INTRAVENOUS | Status: AC
  Filled 2015-03-27: qty 50

## 2015-03-27 MED ORDER — 0.9 % SODIUM CHLORIDE (POUR BTL) OPTIME
TOPICAL | Status: DC | PRN
Start: 2015-03-27 — End: 2015-03-27
  Administered 2015-03-27: 8000 mL

## 2015-03-27 MED ORDER — ACETAMINOPHEN 500 MG PO TABS
1000.0000 mg | ORAL_TABLET | Freq: Four times a day (QID) | ORAL | Status: DC
  Administered 2015-03-27 – 2015-03-31 (×14): 1000 mg via ORAL
  Filled 2015-03-27 (×20): qty 2

## 2015-03-27 MED ORDER — PROTAMINE SULFATE 10 MG/ML IV SOLN
INTRAVENOUS | Status: AC
  Filled 2015-03-27: qty 25

## 2015-03-27 MED ORDER — VECURONIUM BROMIDE 10 MG IV SOLR
INTRAVENOUS | Status: AC
  Filled 2015-03-27: qty 20

## 2015-03-27 MED ORDER — LACTATED RINGERS IV SOLN
INTRAVENOUS | Status: DC | PRN
  Administered 2015-03-27: 06:00:00 via INTRAVENOUS

## 2015-03-27 MED ORDER — SODIUM CHLORIDE 0.9 % IV SOLN: 250.0000 mL | INTRAVENOUS | Status: DC

## 2015-03-27 MED ORDER — MIDAZOLAM HCL 10 MG/2ML IJ SOLN
INTRAMUSCULAR | Status: AC
  Filled 2015-03-27: qty 2

## 2015-03-27 MED ORDER — LACTATED RINGERS IV SOLN
INTRAVENOUS | Status: DC | PRN
  Administered 2015-03-27 (×2): via INTRAVENOUS

## 2015-03-27 MED ORDER — ASPIRIN 81 MG PO CHEW
324.0000 mg | CHEWABLE_TABLET | Freq: Every day | ORAL | Status: DC
  Administered 2015-03-28: 324 mg
  Filled 2015-03-27: qty 4

## 2015-03-27 MED ORDER — ROCURONIUM BROMIDE 100 MG/10ML IV SOLN
INTRAVENOUS | Status: DC | PRN
  Administered 2015-03-27: 50 mg via INTRAVENOUS

## 2015-03-27 MED ORDER — MORPHINE SULFATE 2 MG/ML IJ SOLN
1.0000 mg | INTRAMUSCULAR | Status: AC | PRN
  Administered 2015-03-27 (×3): 2 mg via INTRAVENOUS
  Filled 2015-03-27 (×3): qty 1

## 2015-03-27 MED ORDER — OXYCODONE HCL 5 MG PO TABS
5.0000 mg | ORAL_TABLET | ORAL | Status: DC | PRN
Start: 2015-03-27 — End: 2015-03-30
  Administered 2015-03-28 – 2015-03-29 (×5): 10 mg via ORAL
  Filled 2015-03-27 (×6): qty 2

## 2015-03-27 MED ORDER — MAGNESIUM SULFATE 4 GM/100ML IV SOLN
4.0000 g | Freq: Once | INTRAVENOUS | Status: AC
  Administered 2015-03-27: 4 g via INTRAVENOUS
  Filled 2015-03-27: qty 100

## 2015-03-27 MED ORDER — PANTOPRAZOLE SODIUM 40 MG PO TBEC
40.0000 mg | DELAYED_RELEASE_TABLET | Freq: Every day | ORAL | Status: DC
  Administered 2015-03-29 – 2015-03-31 (×3): 40 mg via ORAL
  Filled 2015-03-27 (×3): qty 1

## 2015-03-27 MED ORDER — SODIUM CHLORIDE 0.9 % IJ SOLN
3.0000 mL | Freq: Two times a day (BID) | INTRAMUSCULAR | Status: DC
Start: 2015-03-28 — End: 2015-03-29
  Administered 2015-03-27 – 2015-03-28 (×3): 3 mL via INTRAVENOUS

## 2015-03-27 MED ORDER — ARTIFICIAL TEARS OP OINT
TOPICAL_OINTMENT | OPHTHALMIC | Status: AC
  Filled 2015-03-27: qty 3.5

## 2015-03-27 MED ORDER — SODIUM CHLORIDE 0.9 % IV SOLN
INTRAVENOUS | Status: DC
  Filled 2015-03-27: qty 40

## 2015-03-27 MED ORDER — LEVALBUTEROL HCL 1.25 MG/0.5ML IN NEBU
1.2500 mg | INHALATION_SOLUTION | Freq: Four times a day (QID) | RESPIRATORY_TRACT | Status: DC
Start: 2015-03-27 — End: 2015-03-31
  Administered 2015-03-27 – 2015-03-30 (×11): 1.25 mg via RESPIRATORY_TRACT
  Filled 2015-03-27 (×22): qty 0.5

## 2015-03-27 MED ORDER — INSULIN REGULAR BOLUS VIA INFUSION
0.0000 [IU] | Freq: Three times a day (TID) | INTRAVENOUS | Status: DC
  Filled 2015-03-27: qty 10

## 2015-03-27 MED ORDER — DEXTROSE 5 % IV SOLN
0.0000 ug/min | INTRAVENOUS | Status: DC
  Filled 2015-03-27: qty 2

## 2015-03-27 MED ORDER — HEPARIN SODIUM (PORCINE) 1000 UNIT/ML IJ SOLN
INTRAMUSCULAR | Status: DC | PRN
  Administered 2015-03-27: 3000 [IU] via INTRAVENOUS
  Administered 2015-03-27: 2000 [IU] via INTRAVENOUS
  Administered 2015-03-27: 30000 [IU] via INTRAVENOUS

## 2015-03-27 MED ORDER — PHENYLEPHRINE HCL 10 MG/ML IJ SOLN
INTRAMUSCULAR | Status: DC | PRN
  Administered 2015-03-27: 80 ug via INTRAVENOUS
  Administered 2015-03-27: 40 ug via INTRAVENOUS
  Administered 2015-03-27 (×3): 80 ug via INTRAVENOUS

## 2015-03-27 MED ORDER — ACETAMINOPHEN 160 MG/5ML PO SOLN: 650.0000 mg | Freq: Once | ORAL | Status: AC

## 2015-03-27 MED ORDER — SODIUM CHLORIDE 0.9 % IJ SOLN
10.0000 mL | Freq: Two times a day (BID) | INTRAMUSCULAR | Status: DC
  Administered 2015-03-27 – 2015-03-29 (×3): 10 mL via INTRAVENOUS

## 2015-03-27 MED ORDER — ARTIFICIAL TEARS OP OINT
TOPICAL_OINTMENT | OPHTHALMIC | Status: DC | PRN
Start: 2015-03-27 — End: 2015-03-27
  Administered 2015-03-27: 1 via OPHTHALMIC

## 2015-03-27 MED ORDER — LACTATED RINGERS IV SOLN
INTRAVENOUS | Status: DC | PRN
  Administered 2015-03-27 (×2): via INTRAVENOUS

## 2015-03-27 MED ORDER — LACTATED RINGERS IV SOLN
INTRAVENOUS | Status: DC
  Administered 2015-03-27: 15:00:00 via INTRAVENOUS
  Administered 2015-03-28: 20 mL/h via INTRAVENOUS

## 2015-03-27 MED FILL — Lidocaine HCl IV Inj 20 MG/ML: INTRAVENOUS | Qty: 5 | Status: AC

## 2015-03-27 MED FILL — Heparin Sodium (Porcine) Inj 1000 Unit/ML: INTRAMUSCULAR | Qty: 10 | Status: AC

## 2015-03-27 MED FILL — Sodium Bicarbonate IV Soln 8.4%: INTRAVENOUS | Qty: 50 | Status: AC

## 2015-03-27 MED FILL — Sodium Chloride IV Soln 0.9%: INTRAVENOUS | Qty: 3000 | Status: AC

## 2015-03-27 MED FILL — Mannitol IV Soln 20%: INTRAVENOUS | Qty: 500 | Status: AC

## 2015-03-27 MED FILL — Electrolyte-R (PH 7.4) Solution: INTRAVENOUS | Qty: 4000 | Status: AC

## 2015-03-27 SURGICAL SUPPLY — 115 items
ADAPTER CARDIO PERF ANTE/RETRO (ADAPTER) ×4 IMPLANT
ADH SKN CLS APL DERMABOND .7 (GAUZE/BANDAGES/DRESSINGS) ×2
ADH SRG 12 PREFL SYR 3 SPRDR (MISCELLANEOUS)
ADPR PRFSN 84XANTGRD RTRGD (ADAPTER) ×2
BAG DECANTER FOR FLEXI CONT (MISCELLANEOUS) ×4 IMPLANT
BANDAGE ELASTIC 4 VELCRO ST LF (GAUZE/BANDAGES/DRESSINGS) ×4 IMPLANT
BANDAGE ELASTIC 6 VELCRO ST LF (GAUZE/BANDAGES/DRESSINGS) ×4 IMPLANT
BASKET HEART  (ORDER IN 25'S) (MISCELLANEOUS) ×1
BASKET HEART (ORDER IN 25'S) (MISCELLANEOUS) ×1
BASKET HEART (ORDER IN 25S) (MISCELLANEOUS) ×2 IMPLANT
BLADE STERNUM SYSTEM 6 (BLADE) ×4 IMPLANT
BLADE SURG 11 STRL SS (BLADE) ×2 IMPLANT
BLADE SURG 12 STRL SS (BLADE) ×4 IMPLANT
BLADE SURG 15 STRL LF DISP TIS (BLADE) ×2 IMPLANT
BLADE SURG 15 STRL SS (BLADE) ×4
BLADE SURG ROTATE 9660 (MISCELLANEOUS) ×2 IMPLANT
BNDG GAUZE ELAST 4 BULKY (GAUZE/BANDAGES/DRESSINGS) ×4 IMPLANT
CANISTER SUCTION 2500CC (MISCELLANEOUS) ×4 IMPLANT
CANNULA ARTERIAL NVNT 3/8 22FR (MISCELLANEOUS) ×2 IMPLANT
CANNULA GUNDRY RCSP 15FR (MISCELLANEOUS) ×4 IMPLANT
CANNULA VESSEL 3MM BLUNT TIP (CANNULA) ×2 IMPLANT
CATH CPB KIT VANTRIGT (MISCELLANEOUS) ×4 IMPLANT
CATH HEART VENT LEFT (CATHETERS) ×2 IMPLANT
CATH RETROPLEGIA CORONARY 14FR (CATHETERS) ×2 IMPLANT
CATH ROBINSON RED A/P 18FR (CATHETERS) ×12 IMPLANT
CATH THORACIC 36FR RT ANG (CATHETERS) ×4 IMPLANT
CLIP FOGARTY SPRING 6M (CLIP) ×2 IMPLANT
CLIP TI WIDE RED SMALL 24 (CLIP) ×2 IMPLANT
CONT SPEC 4OZ CLIKSEAL STRL BL (MISCELLANEOUS) ×2 IMPLANT
COVER SURGICAL LIGHT HANDLE (MISCELLANEOUS) ×4 IMPLANT
CRADLE DONUT ADULT HEAD (MISCELLANEOUS) ×4 IMPLANT
DERMABOND ADVANCED (GAUZE/BANDAGES/DRESSINGS) ×2
DERMABOND ADVANCED .7 DNX12 (GAUZE/BANDAGES/DRESSINGS) IMPLANT
DRAIN CHANNEL 32F RND 10.7 FF (WOUND CARE) ×4 IMPLANT
DRAPE CARDIOVASCULAR INCISE (DRAPES) ×4
DRAPE SLUSH/WARMER DISC (DRAPES) ×4 IMPLANT
DRAPE SRG 135X102X78XABS (DRAPES) ×2 IMPLANT
DRSG AQUACEL AG ADV 3.5X14 (GAUZE/BANDAGES/DRESSINGS) ×4 IMPLANT
ELECT BLADE 4.0 EZ CLEAN MEGAD (MISCELLANEOUS) ×4
ELECT BLADE 6.5 EXT (BLADE) ×4 IMPLANT
ELECT CAUTERY BLADE 6.4 (BLADE) ×4 IMPLANT
ELECT REM PT RETURN 9FT ADLT (ELECTROSURGICAL) ×8
ELECTRODE BLDE 4.0 EZ CLN MEGD (MISCELLANEOUS) ×2 IMPLANT
ELECTRODE REM PT RTRN 9FT ADLT (ELECTROSURGICAL) ×4 IMPLANT
GAUZE SPONGE 4X4 12PLY STRL (GAUZE/BANDAGES/DRESSINGS) ×8 IMPLANT
GLOVE BIO SURGEON STRL SZ 6.5 (GLOVE) ×4 IMPLANT
GLOVE BIO SURGEON STRL SZ7.5 (GLOVE) ×14 IMPLANT
GLOVE BIO SURGEONS STRL SZ 6.5 (GLOVE) ×4
GLOVE BIOGEL PI IND STRL 6.5 (GLOVE) IMPLANT
GLOVE BIOGEL PI IND STRL 7.0 (GLOVE) IMPLANT
GLOVE BIOGEL PI INDICATOR 6.5 (GLOVE) ×2
GLOVE BIOGEL PI INDICATOR 7.0 (GLOVE) ×16
GOWN STRL REUS W/ TWL LRG LVL3 (GOWN DISPOSABLE) ×8 IMPLANT
GOWN STRL REUS W/TWL LRG LVL3 (GOWN DISPOSABLE) ×28
HEMOSTAT POWDER SURGIFOAM 1G (HEMOSTASIS) ×12 IMPLANT
HEMOSTAT SURGICEL 2X14 (HEMOSTASIS) ×4 IMPLANT
INSERT FOGARTY XLG (MISCELLANEOUS) ×2 IMPLANT
KIT BASIN OR (CUSTOM PROCEDURE TRAY) ×4 IMPLANT
KIT ROOM TURNOVER OR (KITS) ×4 IMPLANT
KIT SUCTION CATH 14FR (SUCTIONS) ×4 IMPLANT
KIT VASOVIEW W/TROCAR VH 2000 (KITS) ×4 IMPLANT
LEAD PACING MYOCARDI (MISCELLANEOUS) ×4 IMPLANT
MARKER GRAFT CORONARY BYPASS (MISCELLANEOUS) ×12 IMPLANT
NS IRRIG 1000ML POUR BTL (IV SOLUTION) ×28 IMPLANT
PACK OPEN HEART (CUSTOM PROCEDURE TRAY) ×4 IMPLANT
PAD ARMBOARD 7.5X6 YLW CONV (MISCELLANEOUS) ×8 IMPLANT
PAD ELECT DEFIB RADIOL ZOLL (MISCELLANEOUS) ×4 IMPLANT
PENCIL BUTTON HOLSTER BLD 10FT (ELECTRODE) ×4 IMPLANT
PUNCH AORTIC ROTATE  4.5MM 8IN (MISCELLANEOUS) ×2 IMPLANT
PUNCH AORTIC ROTATE 4.0MM (MISCELLANEOUS) IMPLANT
PUNCH AORTIC ROTATE 4.5MM 8IN (MISCELLANEOUS) IMPLANT
PUNCH AORTIC ROTATE 5MM 8IN (MISCELLANEOUS) IMPLANT
SOLUTION ANTI FOG 6CC (MISCELLANEOUS) ×2 IMPLANT
SURGIFLO W/THROMBIN 8M KIT (HEMOSTASIS) ×6 IMPLANT
SUT BONE WAX W31G (SUTURE) ×4 IMPLANT
SUT ETHIBON 2 0 V 52N 30 (SUTURE) ×12 IMPLANT
SUT ETHIBOND 2 0 SH (SUTURE) ×4
SUT ETHIBOND 2 0 SH 36X2 (SUTURE) ×2 IMPLANT
SUT MNCRL AB 4-0 PS2 18 (SUTURE) ×4 IMPLANT
SUT PROLENE 3 0 RB 1 (SUTURE) ×4 IMPLANT
SUT PROLENE 3 0 SH 1 (SUTURE) IMPLANT
SUT PROLENE 3 0 SH DA (SUTURE) IMPLANT
SUT PROLENE 3 0 SH1 36 (SUTURE) IMPLANT
SUT PROLENE 4 0 RB 1 (SUTURE) ×28
SUT PROLENE 4 0 SH DA (SUTURE) ×8 IMPLANT
SUT PROLENE 4-0 RB1 .5 CRCL 36 (SUTURE) ×6 IMPLANT
SUT PROLENE 5 0 C 1 36 (SUTURE) ×2 IMPLANT
SUT PROLENE 6 0 C 1 30 (SUTURE) ×12 IMPLANT
SUT PROLENE 6 0 CC (SUTURE) ×12 IMPLANT
SUT PROLENE 8 0 BV175 6 (SUTURE) IMPLANT
SUT PROLENE BLUE 7 0 (SUTURE) ×8 IMPLANT
SUT SILK  1 MH (SUTURE) ×4
SUT SILK 1 MH (SUTURE) IMPLANT
SUT SILK 2 0 SH CR/8 (SUTURE) ×2 IMPLANT
SUT SILK 3 0 SH CR/8 (SUTURE) ×2 IMPLANT
SUT STEEL 6MS V (SUTURE) ×8 IMPLANT
SUT STEEL SZ 6 DBL 3X14 BALL (SUTURE) ×4 IMPLANT
SUT VIC AB 1 CTX 36 (SUTURE) ×16
SUT VIC AB 1 CTX36XBRD ANBCTR (SUTURE) ×4 IMPLANT
SUT VIC AB 2-0 CT1 27 (SUTURE) ×4
SUT VIC AB 2-0 CT1 TAPERPNT 27 (SUTURE) IMPLANT
SUT VIC AB 2-0 CTX 27 (SUTURE) IMPLANT
SUT VIC AB 3-0 X1 27 (SUTURE) IMPLANT
SUTURE E-PAK OPEN HEART (SUTURE) ×4 IMPLANT
SYR 10ML KIT SKIN ADHESIVE (MISCELLANEOUS) IMPLANT
SYSTEM SAHARA CHEST DRAIN ATS (WOUND CARE) ×4 IMPLANT
TAPE CLOTH SURG 4X10 WHT LF (GAUZE/BANDAGES/DRESSINGS) ×2 IMPLANT
TOWEL OR 17X24 6PK STRL BLUE (TOWEL DISPOSABLE) ×8 IMPLANT
TOWEL OR 17X26 10 PK STRL BLUE (TOWEL DISPOSABLE) ×8 IMPLANT
TRAY FOLEY IC TEMP SENS 16FR (CATHETERS) ×4 IMPLANT
TUBING INSUFFLATION (TUBING) ×4 IMPLANT
UNDERPAD 30X30 INCONTINENT (UNDERPADS AND DIAPERS) ×4 IMPLANT
VALVE MAGNA EASE AORTIC 23MM (Prosthesis & Implant Heart) ×2 IMPLANT
VENT LEFT HEART 12002 (CATHETERS) ×4
WATER STERILE IRR 1000ML POUR (IV SOLUTION) ×8 IMPLANT

## 2015-03-27 NOTE — Progress Notes (Signed)
Rapid wean protocol started at this time.  Pt awake, following commands and tolerating vent changes well.  Rt will monitor

## 2015-03-27 NOTE — Transfer of Care (Signed)
Immediate Anesthesia Transfer of Care Note  Patient: Chris Reyes  Procedure(s) Performed: Procedure(s): AORTIC VALVE REPLACEMENT (AVR) (N/A) CORONARY ARTERY BYPASS GRAFTING (CABG), ON PUMP, TIMES THREE, USING LEFT INTERNAL MAMMARY ARTERY, RIGHT GREATER SAPHENOUS VEIN HARVESTED ENDOSCOPICALLY (N/A) TRANSESOPHAGEAL ECHOCARDIOGRAM (TEE) (N/A)  Patient Location: SICU  Anesthesia Type:General  Level of Consciousness: sedated and Patient remains intubated per anesthesia plan  Airway & Oxygen Therapy: Patient remains intubated per anesthesia plan and Patient placed on Ventilator (see vital sign flow sheet for setting)  Post-op Assessment: Report given to RN and Post -op Vital signs reviewed and stable  Post vital signs: Reviewed and stable  Last Vitals:  Filed Vitals:   03/06/2015 0600  BP:   Pulse: 67  Temp:   Resp:     Complications: No apparent anesthesia complications

## 2015-03-27 NOTE — Procedures (Signed)
Extubation Procedure Note  Patient Details:   Name: Chris Reyes DOB: 1951-06-28 MRN: [id number]   Airway Documentation:     Evaluation  O2 sats: stable throughout Complications: No apparent complications Patient did tolerate procedure well. Bilateral Breath Sounds: Clear   Yes Pt assessed for extubation. Alert and oriented, stable on documented settings. NIF -37 FVC 1116ml Pt extubated to 4L [place] without issues.  Darral Dash 03/26/2015, 8:13 PM

## 2015-03-27 NOTE — Progress Notes (Addendum)
The patient was examined and preop studies reviewed. There has been no change from the prior exam and the patient is ready for surgery  plan AVR-CABG on D [place]  his severe cardiac disease-severe aortic bicuspid stenosis and multivessel coronary disease     HISTORY AND PHYSICAL Since the patient's last visit he has undergone right and left heart cardiac catheterization.this demonstrated a 90% stenosis of the dominant RCA, 75% stenosis of the diagonal, 90% stenosis of the circumflex and PA pressures of 33/7 with a wedge of 20 and LVEDP23 mmHg with cardiac index 1.9 and Peak aortic gradient 74 mmHg   procedure was performed via right radial artery and her no complications or hematoma.  The patient previously has had all his teeth excised.  The patient is stop smoking. He denies any active pulmonary symptoms.  The patient wishes to have surgery scheduled in late July so that his son can be present to help care for his invalid wife. We discussed the scheduled date of surgery-July 25 and that he will stop his Coumadin for history of DVT-pulmonary emboli 5 days prior to surgery. He will have vein mapping completed prior to his surgery.   Past Medical History   Diagnosis  Date   .  Hypertension     .  Hernia     .  COPD (chronic obstructive pulmonary disease)     .  CAD (coronary artery disease)         Catheterization, [place], July, 2006, EF 45%,  50% LAD, 50% RCA, 90% marginal, possible collaterals from LAD  to a branch of the PDA, medical therapy recommended   .  GI bleed         2006, no significant abnormalities per the patient   .  PAD (peripheral artery disease)         Arterial leg Dopplers, April, 2014, normal   .  Ejection fraction         EF 45%, catheterization, 2006  //   EF 55%, echo, March, 2012, wall abnormality at the base of the inferior wall   .  Aortic stenosis         severe echo 01/2013- cannot rule out bicuspid aortic valve  //   moderately severe, echo,  March,  2012 ( progression since 2009)   .  RBBB     .  Pulmonary emboli  2007       Significant, 2007, Coumadin therapy   .  Warfarin anticoagulation         History pulmonary emboli   .  Bradycardia     .  Tobacco abuse     .  Carotid artery disease         Doppler, March, 2012,  0-39% bilateral   .  Dyslipidemia     .  Heart murmur     .  History of hiatal hernia     .  GERD (gastroesophageal reflux disease)     .  Sinus headache     .  Migraine         "not very often at all" (07/13/2014)   .  Chronic lower back pain         Past Surgical History   Procedure  Laterality  Date   .  Back surgery    1988, 1990, 2006   .  Cholecystectomy    1998   .  Tonsillectomy       .  Carpal tunnel release  Left  ~ 1994   .  Lumbar disc surgery    1988; 1990   .  Posterior lumbar fusion    2006   .  Cardiac catheterization  N/A  02/14/2015       Procedure: Right/Left Heart Cath and Coronary Angiography;  Surgeon: Belva Crome, MD;  Location: [facility];  Service: Cardiovascular;  Laterality: N/A;       Family History   Problem  Relation  Age of Onset   .  Diabetes  Father     .  Cancer  Father         large cell lung cancer   .  Heart disease  Mother       Social History History   Substance Use Topics   .  Smoking status:  Former Smoker -- 0.50 packs/day for 20 years       Types:  Cigarettes       Quit date:  07/13/2014   .  Smokeless tobacco:  Never Used   .  Alcohol Use:  0.0 oz/week       0 Standard drinks or equivalent per week         Comment: 07/13/2014 "1 drink a month"       Current Outpatient Prescriptions   Medication  Sig  Dispense  Refill   .  aspirin EC 81 MG EC tablet  Take 1 tablet (81 mg total) by mouth daily.       .  Coenzyme Q10 (COQ10) 100 MG CAPS  Take 1 capsule by mouth daily.        .  fish oil-omega-3 fatty acids 1000 MG capsule  Take 1 g by mouth daily.       .  fluticasone (FLONASE) 50 MCG/ACT nasal spray  Place 1 spray into both nostrils daily.   16 g  2   .  ibuprofen (ADVIL,MOTRIN) 200 MG tablet  Take 600 mg by mouth every 6 (six) hours as needed (pain).       .  isosorbide mononitrate (IMDUR) 30 MG 24 hr tablet  Take 30 mg by mouth daily.       Marland Kitchen  losartan (COZAAR) 50 MG tablet  TAKE 1 TABLET (50 MG TOTAL) BY MOUTH DAILY.  90 tablet  3   .  warfarin (COUMADIN) 5 MG tablet  TAKE AS DIRECTED BY ANTICOAGULATION CLINIC  90 tablet  0   .  [DISCONTINUED] LISINOPRIL PO  Take by mouth daily.            No current facility-administered medications for this visit.     No Known Allergies  Review of Systems   Review of Systems No significant changes since last visit  General:  No weight loss   no fever   no decreased energy  no night sweats Cardiac:  -Chest pain with exertion - resting chest pain   +SOB with exertion -  Orthopnea                  PND  ankle edema  syncope Pulmonary:  no dyspnea,no cough, no productive cough no home oxygen no hemoptysis GI: no difficulty swallowing  no GERD no jaundice  no melena  no hematemesis no         abdominal pain GU:  no dysuria  no hematuria  no frequent UTI no BPH Vascular:  no claudication  No TIA +++varicose veins +hxDVT Neuro:  no sroke no seizures no TIA  no head trauma no vision changes Musculoskeletal:  normal mobility no arthritis  no gout  no joint swelling Skin: no rash  no skin ulceration  no skin cancer Endocrine: diabetes  no thyroid didease Hematologic: no easy bruising  no blood transfusions  no frequent epistaxis ENT : no painful teeth no dentures no loose teeth Psych : no anxiety  no depression no psych hospitalizations       \  BP 141/75 mmHg  Pulse 58  Resp 16  Ht 5\' 11"  (1.803 m)  Wt 252 lb (114.306 kg)  BMI 35.16 kg/m2  SpO2 98% Physical Exam       Physical Exam  General: middle-aged Caucasian male who appears older than his stated age 64: Normocephalic pupils equal , dentition adequate Neck: Supple without JVD, adenopathy, or bruit Chest: Clear to  auscultation, symmetrical breath sounds, no rhonchi, no tenderness             or deformity Cardiovascular: Regular rate and rhythm, 4/6 aortic stenosis murmur, no gallop, peripheral pulses             palpable in all extremities-severe bilateral varicose veins below the knees Abdomen:  Soft, nontender, no palpable mass or organomegaly Extremities: Warm, well-perfused, no clubbing cyanosis edema or tenderness,             ++venous stasis changes of the legs Rectal/GU: Deferred Neuro: Grossly non--focal and symmetrical throughout Skin: Clean and dry without rash or ulceration  Diagnostic Tests: Results of cardiac cath discuss with patient in detail. The patient understands he will need aortic valve replacement with a bioprosthetic valve. CT scan of the chest showed no evidence of aneurysmal dilatation of the ascending aorta, also no evidence of pulmonary nodules or mass. Will plan on bypass grafts to the diagonal, circumflex, and RCA. Vein harvest may be problematic and the patient will receive vein mapping prior to surgery.  Impression: Severe AS, significant multivessel CAD Moderate LV dysfunction Plan:tissue aVR with combined CABG schedule for July 25 at [facility] hospital   Len Childs, MD [facility] [phone]

## 2015-03-27 NOTE — OR Nursing (Signed)
40 minute call to SICU charge nurse at 1307.

## 2015-03-27 NOTE — Progress Notes (Signed)
*  PRELIMINARY RESULTS* Echocardiogram Echocardiogram Transesophageal has been performed.  Leavy Cella 03/15/2015, 8:20 AM

## 2015-03-27 NOTE — Brief Op Note (Signed)
03/31/2015  12:10 PM  PATIENT:  Chris Reyes  64 y.o. male  PRE-OPERATIVE DIAGNOSIS:  AS CAD  POST-OPERATIVE DIAGNOSIS:  AS CAD  PROCEDURE:  Procedure(s):  AORTIC VALVE REPLACEMENT  -23 mm Edwards Life NIKE Ease Pericardial Tissue Valve  CORONARY ARTERY BYPASS GRAFTING x 3 -LIMA to LAD -SVG to DIAGONAL -SVG to DISTAL RCA   RIGHT GREATER SAPHENOUS VEIN HARVESTED ENDOSCOPICALLY   TRANSESOPHAGEAL ECHOCARDIOGRAM (TEE) (N/A)  SURGEON:  Surgeon(s) and Role:    * Ivin Poot, MD - Primary  PHYSICIAN ASSISTANT: Ellwood Handler PA-C  ANESTHESIA:   general  EBL:  Total I/O In: 1000 [I.V.:1000] Out: 800 [Urine:800]  BLOOD ADMINISTERED:  CELLSAVER  DRAINS: Left Pleural Chest Tube, Mediastinal Chest Drains   LOCAL MEDICATIONS USED:  NONE  SPECIMEN:  Source of Specimen:  Aortic Valve Leaflets  DISPOSITION OF SPECIMEN:  PATHOLOGY  COUNTS:  YES  TOURNIQUET:  * No tourniquets in log *  DICTATION: .Dragon Dictation  PLAN OF CARE: Admit to inpatient   PATIENT DISPOSITION:  ICU - intubated and hemodynamically stable.   Delay start of Pharmacological VTE agent (>24hrs) due to surgical blood loss or risk of bleeding: yes  Aortic Valve Etiology   Aortic Insufficiency:  Moderate  Aortic Valve Disease:  Yes.  Aortic Stenosis:  Yes. Smallest Aortic Valve Area: 0.71cm2; Highest Mean Gradient: 28mmHg.  Etiology (Choose at least one and up to  5 etiologies):  Degenerative - Calcified   Aortic Valve  Procedure Performed:  Replacement: Yes.  Bioprosthetic Valve. Implant Model Number:[id number], Size:23, Unique Device Identifier:[id number]  Repair/Reconstruction: No.   Aortic Annular Enlargement: No.

## 2015-03-27 NOTE — OR Nursing (Signed)
Off pump, one hour call to SICU charge nurse at 1248.

## 2015-03-27 NOTE — Anesthesia Preprocedure Evaluation (Addendum)
Anesthesia Evaluation  Patient identified by MRN, date of birth, ID band Patient awake    Reviewed: Allergy & Precautions, NPO status , Patient's Chart, lab work & pertinent test results  History of Anesthesia Complications Negative for: history of anesthetic complications  Airway Mallampati: II  TM Distance: >3 FB Neck ROM: Full    Dental  (+) Edentulous Upper, Edentulous Lower   Pulmonary shortness of breath, COPDformer smoker,  breath sounds clear to auscultation        Cardiovascular hypertension, Pt. on medications and Pt. on home beta blockers + CAD and + Peripheral Vascular Disease + dysrhythmias + Valvular Problems/Murmurs AS Rhythm:Regular Rate:Normal + Systolic murmurs    Neuro/Psych  Headaches,  Neuromuscular disease    GI/Hepatic hiatal hernia, GERD-  ,  Endo/Other    Renal/GU      Musculoskeletal   Abdominal (+) + obese,   Peds  Hematology   Anesthesia Other Findings   Reproductive/Obstetrics                            Anesthesia Physical Anesthesia Plan  ASA: IV  Anesthesia Plan: General   Post-op Pain Management:    Induction: Intravenous  Airway Management Planned: Oral ETT  Additional Equipment: Arterial line, PA Cath and TEE  Intra-op Plan:   Post-operative Plan: Post-operative intubation/ventilation  Informed Consent: I have reviewed the patients History and Physical, chart, labs and discussed the procedure including the risks, benefits and alternatives for the proposed anesthesia with the patient or authorized representative who has indicated his/her understanding and acceptance.     Plan Discussed with: CRNA and Surgeon  Anesthesia Plan Comments:         Anesthesia Quick Evaluation

## 2015-03-27 NOTE — Progress Notes (Signed)
      [address]       [place] [address]             [phone]      Intubated but starting to wake up  BP 92/61 mmHg  Pulse 79  Temp(Src) 98.4 F (36.9 C) (Oral)  Resp 16  Ht 5\' 11"  (1.803 m)  Wt 250 lb 7 oz (113.598 kg)  BMI 34.94 kg/m2  SpO2 97%   Intake/Output Summary (Last 24 hours) at 03/21/2015 1846 Last data filed at 03/16/2015 1800  Gross per 24 hour  Intake 5320.44 ml  Output   4330 ml  Net 990.44 ml   CBG well controlled  Hct= 42  Doing well early postop  Remo Lipps C. Roxan Hockey, MD [facility] [phone]

## 2015-03-27 NOTE — Anesthesia Procedure Notes (Signed)
Procedure Name: Intubation Date/Time: 03/26/2015 7:45 AM Performed by: Susa Loffler Pre-anesthesia Checklist: Patient identified, Timeout performed, Emergency Drugs available, Suction available and Patient being monitored Patient Re-evaluated:Patient Re-evaluated prior to inductionOxygen Delivery Method: Circle system utilized Preoxygenation: Pre-oxygenation with 100% oxygen Intubation Type: IV induction Ventilation: Mask ventilation without difficulty and Oral airway inserted - appropriate to patient size Laryngoscope Size: Mac and 4 Grade View: Grade I Tube type: Oral Tube size: 8.0 mm Number of attempts: 1 Airway Equipment and Method: Stylet and Oral airway Placement Confirmation: ETT inserted through vocal cords under direct vision,  positive ETCO2 and breath sounds checked- equal and bilateral Secured at: 23 cm Tube secured with: Tape Dental Injury: Teeth and Oropharynx as per pre-operative assessment

## 2015-03-27 NOTE — OR Nursing (Signed)
Twenty minute call to SICU charge nurse at 1345.

## 2015-03-28 ENCOUNTER — Encounter (HOSPITAL_COMMUNITY): Payer: Self-pay | Admitting: Cardiothoracic Surgery

## 2015-03-28 ENCOUNTER — Inpatient Hospital Stay (HOSPITAL_COMMUNITY): Payer: Medicare Other

## 2015-03-28 LAB — GLUCOSE, CAPILLARY
Glucose-Capillary: 106 mg/dL — ABNORMAL HIGH (ref 65–99)
Glucose-Capillary: 109 mg/dL — ABNORMAL HIGH (ref 65–99)
Glucose-Capillary: 114 mg/dL — ABNORMAL HIGH (ref 65–99)
Glucose-Capillary: 118 mg/dL — ABNORMAL HIGH (ref 65–99)
Glucose-Capillary: 121 mg/dL — ABNORMAL HIGH (ref 65–99)
Glucose-Capillary: 121 mg/dL — ABNORMAL HIGH (ref 65–99)
Glucose-Capillary: 123 mg/dL — ABNORMAL HIGH (ref 65–99)
Glucose-Capillary: 126 mg/dL — ABNORMAL HIGH (ref 65–99)
Glucose-Capillary: 127 mg/dL — ABNORMAL HIGH (ref 65–99)
Glucose-Capillary: 127 mg/dL — ABNORMAL HIGH (ref 65–99)
Glucose-Capillary: 128 mg/dL — ABNORMAL HIGH (ref 65–99)
Glucose-Capillary: 128 mg/dL — ABNORMAL HIGH (ref 65–99)
Glucose-Capillary: 130 mg/dL — ABNORMAL HIGH (ref 65–99)
Glucose-Capillary: 131 mg/dL — ABNORMAL HIGH (ref 65–99)
Glucose-Capillary: 133 mg/dL — ABNORMAL HIGH (ref 65–99)
Glucose-Capillary: 135 mg/dL — ABNORMAL HIGH (ref 65–99)
Glucose-Capillary: 136 mg/dL — ABNORMAL HIGH (ref 65–99)
Glucose-Capillary: 136 mg/dL — ABNORMAL HIGH (ref 65–99)
Glucose-Capillary: 152 mg/dL — ABNORMAL HIGH (ref 65–99)
Glucose-Capillary: 176 mg/dL — ABNORMAL HIGH (ref 65–99)
Glucose-Capillary: 95 mg/dL (ref 65–99)
Glucose-Capillary: 99 mg/dL (ref 65–99)

## 2015-03-28 LAB — POCT I-STAT, CHEM 8
BUN: 15 mg/dL (ref 6–20)
Calcium, Ion: 1.16 mmol/L (ref 1.13–1.30)
Chloride: 97 mmol/L — ABNORMAL LOW (ref 101–111)
Creatinine, Ser: 0.8 mg/dL (ref 0.61–1.24)
Glucose, Bld: 139 mg/dL — ABNORMAL HIGH (ref 65–99)
HCT: 41 % (ref 39.0–52.0)
Hemoglobin: 13.9 g/dL (ref 13.0–17.0)
Potassium: 3.9 mmol/L (ref 3.5–5.1)
Sodium: 134 mmol/L — ABNORMAL LOW (ref 135–145)
TCO2: 22 mmol/L (ref 0–100)

## 2015-03-28 LAB — POCT I-STAT 3, ART BLOOD GAS (G3+)
Acid-base deficit: 1 mmol/L (ref 0.0–2.0)
Bicarbonate: 23.3 mEq/L (ref 20.0–24.0)
O2 Saturation: 93 %
Patient temperature: 37.5
TCO2: 24 mmol/L (ref 0–100)
pCO2 arterial: 38.6 mmHg (ref 35.0–45.0)
pH, Arterial: 7.392 (ref 7.350–7.450)
pO2, Arterial: 70 mmHg — ABNORMAL LOW (ref 80.0–100.0)

## 2015-03-28 LAB — CBC
HCT: 39.5 % (ref 39.0–52.0)
HCT: 39 % (ref 39.0–52.0)
HEMOGLOBIN: 13.9 g/dL (ref 13.0–17.0)
Hemoglobin: 13.6 g/dL (ref 13.0–17.0)
MCH: 32.6 pg (ref 26.0–34.0)
MCH: 32.5 pg (ref 26.0–34.0)
MCHC: 35.2 g/dL (ref 30.0–36.0)
MCHC: 34.9 g/dL (ref 30.0–36.0)
MCV: 92.5 fL (ref 78.0–100.0)
MCV: 93.3 fL (ref 78.0–100.0)
PLATELETS: 116 10*3/uL — AB (ref 150–400)
Platelets: 109 10*3/uL — ABNORMAL LOW (ref 150–400)
RBC: 4.27 MIL/uL (ref 4.22–5.81)
RBC: 4.18 MIL/uL — ABNORMAL LOW (ref 4.22–5.81)
RDW: 13.3 % (ref 11.5–15.5)
RDW: 13.4 % (ref 11.5–15.5)
WBC: 14.5 10*3/uL — ABNORMAL HIGH (ref 4.0–10.5)
WBC: 16.1 10*3/uL — ABNORMAL HIGH (ref 4.0–10.5)

## 2015-03-28 LAB — CREATININE, SERUM
Creatinine, Ser: 0.97 mg/dL (ref 0.61–1.24)
GFR calc non Af Amer: >60 mL/min (ref >60)

## 2015-03-28 LAB — MAGNESIUM
MAGNESIUM: 1.9 mg/dL (ref 1.7–2.4)
Magnesium: 2 mg/dL (ref 1.7–2.4)

## 2015-03-28 LAB — BASIC METABOLIC PANEL
ANION GAP: 6 (ref 5–15)
BUN: 11 mg/dL (ref 6–20)
CHLORIDE: 105 mmol/L (ref 101–111)
CO2: 24 mmol/L (ref 22–32)
Calcium: 7.8 mg/dL — ABNORMAL LOW (ref 8.9–10.3)
Creatinine, Ser: 0.73 mg/dL (ref 0.61–1.24)
GFR calc non Af Amer: >60 mL/min (ref >60)
Glucose, Bld: 143 mg/dL — ABNORMAL HIGH (ref 65–99)
Potassium: 3.7 mmol/L (ref 3.5–5.1)
Sodium: 135 mmol/L (ref 135–145)

## 2015-03-28 MED ORDER — POTASSIUM CHLORIDE 10 MEQ/50ML IV SOLN
10.0000 meq | Freq: Once | INTRAVENOUS | Status: AC
  Administered 2015-03-28: 10 meq via INTRAVENOUS
  Filled 2015-03-28: qty 50

## 2015-03-28 MED ORDER — INSULIN DETEMIR 100 UNIT/ML ~~LOC~~ SOLN
18.0000 [IU] | Freq: Two times a day (BID) | SUBCUTANEOUS | Status: DC
  Administered 2015-03-28 – 2015-03-31 (×7): 18 [IU] via SUBCUTANEOUS
  Filled 2015-03-28 (×9): qty 0.18

## 2015-03-28 MED ORDER — WARFARIN - PHYSICIAN DOSING INPATIENT
Freq: Every day | Status: DC
  Administered 2015-03-28: 18:00:00

## 2015-03-28 MED ORDER — WARFARIN SODIUM 2.5 MG PO TABS
2.5000 mg | ORAL_TABLET | Freq: Every day | ORAL | Status: DC
  Administered 2015-03-28: 2.5 mg via ORAL
  Filled 2015-03-28 (×2): qty 1

## 2015-03-28 MED ORDER — MORPHINE SULFATE 2 MG/ML IJ SOLN: 1.0000 mg | INTRAMUSCULAR | Status: DC | PRN

## 2015-03-28 MED ORDER — FUROSEMIDE 10 MG/ML IJ SOLN
20.0000 mg | Freq: Two times a day (BID) | INTRAMUSCULAR | Status: DC
  Administered 2015-03-28 (×2): 20 mg via INTRAVENOUS
  Filled 2015-03-28 (×4): qty 2

## 2015-03-28 MED ORDER — INSULIN ASPART 100 UNIT/ML ~~LOC~~ SOLN
0.0000 [IU] | SUBCUTANEOUS | Status: DC
  Administered 2015-03-28 – 2015-03-30 (×5): 2 [IU] via SUBCUTANEOUS

## 2015-03-28 MED ORDER — POTASSIUM CHLORIDE 10 MEQ/50ML IV SOLN
10.0000 meq | INTRAVENOUS | Status: AC
  Administered 2015-03-28 (×2): 10 meq via INTRAVENOUS
  Filled 2015-03-28 (×2): qty 50

## 2015-03-28 MED ORDER — DOPAMINE-DEXTROSE 3.2-5 MG/ML-% IV SOLN: 0.0000 ug/kg/min | INTRAVENOUS | Status: DC

## 2015-03-28 MED ORDER — POTASSIUM CHLORIDE 10 MEQ/50ML IV SOLN
10.0000 meq | INTRAVENOUS | Status: AC
  Administered 2015-03-28 (×3): 10 meq via INTRAVENOUS

## 2015-03-28 MED ORDER — MECLIZINE HCL 12.5 MG PO TABS
12.5000 mg | ORAL_TABLET | Freq: Two times a day (BID) | ORAL | Status: DC
  Administered 2015-03-28 – 2015-03-29 (×4): 12.5 mg via ORAL
  Filled 2015-03-28 (×6): qty 1

## 2015-03-28 MED FILL — Heparin Sodium (Porcine) Inj 1000 Unit/ML: INTRAMUSCULAR | Qty: 30 | Status: AC

## 2015-03-28 MED FILL — Magnesium Sulfate Inj 50%: INTRAMUSCULAR | Qty: 10 | Status: AC

## 2015-03-28 MED FILL — Potassium Chloride Inj 2 mEq/ML: INTRAVENOUS | Qty: 40 | Status: AC

## 2015-03-28 NOTE — Progress Notes (Signed)
1 Day Post-Op Procedure(s) (LRB): AORTIC VALVE REPLACEMENT (AVR) (N/A) CORONARY ARTERY BYPASS GRAFTING (CABG), ON PUMP, TIMES THREE, USING LEFT INTERNAL MAMMARY ARTERY, RIGHT GREATER SAPHENOUS VEIN HARVESTED ENDOSCOPICALLY (N/A) TRANSESOPHAGEAL ECHOCARDIOGRAM (TEE) (N/A) Subjective: Alert and breathing comfortavbly nsr Wt up 10 # Objective: Vital signs in last 24 hours: Temp:  [97.3 F (36.3 C)-100 F (37.8 C)] 99 F (37.2 C) (07/26 0700) Pulse Rate:  [70-96] 81 (07/26 0700) Cardiac Rhythm:  [-] Normal sinus rhythm;Bundle branch block (07/26 0000) Resp:  [0-31] 21 (07/26 0700) BP: (79-122)/(52-78) 97/60 mmHg (07/26 0700) SpO2:  [91 %-99 %] 96 % (07/26 0715) Arterial Line BP: (93-148)/(47-70) 112/51 mmHg (07/26 0700) FiO2 (%):  [40 %-60 %] 40 % (07/25 1920) Weight:  [266 lb 1.5 oz (120.7 kg)] 266 lb 1.5 oz (120.7 kg) (07/26 0600)  Hemodynamic parameters for last 24 hours: PAP: (13-36)/(5-29) 25/20 mmHg CO:  [4.7 L/min-8.3 L/min] 8.3 L/min CI:  [2 L/min/m2-3.6 L/min/m2] 3.6 L/min/m2  Intake/Output from previous day: 07/25 0701 - 07/26 0700 In: 7932.8 [P.O.:720; I.V.:4767.8; Blood:1145; IV Piggyback:1300] Out: 9407 [Urine:3625; Blood:1690; Chest Tube:480] Intake/Output this shift:    Lungs clear abd soft extremities warm  Lab Results:  Recent Labs  03/12/2015 2041 03/28/15 0430  WBC 15.0* 14.5*  HGB 14.4 13.9  HCT 41.1 39.5  PLT 120* 116*   BMET:  Recent Labs  03/26/2015 2025 03/23/2015 2041 03/28/15 0430  NA 136  --  135  K 4.0  --  3.7  CL 103  --  105  CO2  --   --  24  GLUCOSE 193*  --  143*  BUN 13  --  11  CREATININE 0.70 0.84 0.73  CALCIUM  --   --  7.8*    PT/INR:  Recent Labs  03/03/2015 1459  LABPROT 17.9*  INR 1.47   ABG    Component Value Date/Time   PHART 7.392 03/28/2015 0404   HCO3 23.3 03/28/2015 0404   TCO2 24 03/28/2015 0404   ACIDBASEDEF 1.0 03/28/2015 0404   O2SAT 93.0 03/28/2015 0404   CBG (last 3)   Recent Labs   03/28/15 0453 03/28/15 0603 03/28/15 0705  GLUCAP 128* 127* 123*    Assessment/Plan: S/P Procedure(s) (LRB): AORTIC VALVE REPLACEMENT (AVR) (N/A) CORONARY ARTERY BYPASS GRAFTING (CABG), ON PUMP, TIMES THREE, USING LEFT INTERNAL MAMMARY ARTERY, RIGHT GREATER SAPHENOUS VEIN HARVESTED ENDOSCOPICALLY (N/A) TRANSESOPHAGEAL ECHOCARDIOGRAM (TEE) (N/A) Mobilize Diuresis Diabetes control d/c tubes/lines See progression orders   LOS: 1 day    Tharon Aquas Trigt III 03/28/2015

## 2015-03-28 NOTE — Progress Notes (Signed)
Utilization Review Completed.  

## 2015-03-28 NOTE — Progress Notes (Signed)
  TCTS BRIEF SICU PROGRESS NOTE  1 Day Post-Op  S/P Procedure(s) (LRB): AORTIC VALVE REPLACEMENT (AVR) (N/A) CORONARY ARTERY BYPASS GRAFTING (CABG), ON PUMP, TIMES THREE, USING LEFT INTERNAL MAMMARY ARTERY, RIGHT GREATER SAPHENOUS VEIN HARVESTED ENDOSCOPICALLY (N/A) TRANSESOPHAGEAL ECHOCARDIOGRAM (TEE) (N/A)   Stable day AV paced w/ stable BP O2 sats 94% on 3 L/min UOP adequate  Plan: Continue current plan  Rexene Alberts 03/28/2015 8:08 PM

## 2015-03-28 NOTE — Plan of Care (Signed)
Problem: Phase II - Intermediate Post-Op Goal: Wean to Extubate Outcome: Completed/Met Date Met:  03/28/15 Met and achieved Rapid Wean protocol, extubated to 4L [place] after 6 hrs and meeting protocol standards Goal: CBGs/Blood Glucose per SCIP Criteria Outcome: Progressing Insulin drip and Q1 CBGs per glucostabilizer.

## 2015-03-28 NOTE — Progress Notes (Signed)
K+= 3.7 and creat= 0.73 w/ urine o/p > 30cc/hr; TCTS KCL protocol initiated with 10 mEq KCL in 50cc IV x 3, each over one hour.

## 2015-03-28 NOTE — Op Note (Signed)
NAME:  KARON, HECKENDORN NO.:  [id number]  MEDICAL RECORD NO.:  [id number]  LOCATION:  2S01C                        FACILITY:  [facility]  PHYSICIAN:  Ivin Poot, M.D.  DATE OF BIRTH:  23-Dec-1950  DATE OF PROCEDURE:  03/10/2015 DATE OF DISCHARGE:                              OPERATIVE REPORT   OPERATIONS: 1. Aortic valve replacement with a 23-mm Edwards pericardial tissue     valve, serial [id number], model Magna Ease. 2. Coronary artery bypass grafting x3 (left internal mammary artery to     distal left anterior descending, saphenous vein graft to right     coronary artery, saphenous vein graft to first diagonal). 3. Endoscopic harvest of right leg greater saphenous vein with     exposure, but not harvested left leg greater saphenous vein which     was unusable.  PREOPERATIVE DIAGNOSES:  Severe aortic stenosis, multivessel coronary artery disease, class III congestive heart failure.  POSTOPERATIVE DIAGNOSES:  Severe aortic stenosis, multivessel coronary artery disease, class III congestive heart failure.  SURGEON:  Ivin Poot, M.D.  ASSISTANT:  Providence Crosby, PA-C.  ANESTHESIA:  General by Ala Dach, M.D.  INDICATIONS:  The patient is a 64 year old Caucasian male, ex-smoker, with known aortic stenosis and increasing symptoms of dyspnea and presyncope with increasing gradients on serial echocardiograms.  He was recommended to undergo full evaluation with cardiac catheterization and then be assessed for surgery, however, due to family situations, he was not willing to proceed down the recommended patient care pathway.  He presented with severe symptoms of heart failure and presyncope and saw his primary cardiologist, Dr. Ron Parker.  He subsequently underwent cardiac catheterization without crossing the aortic valve by Dr. Daneen Schick, documented severe 99% stenosis of a large right coronary, significant stenosis of a small distal circumflex marginal,  and 75% stenosis of a large diagonal with 60% stenosis of the distal LAD.  He was recommended for AVR-CABG.  I saw the patient in the office and reviewed the results of these studies and discussed the indications and expected benefits of combined AVR-CABG for treatment of his severe aortic stenosis and coronary artery disease.  I discussed with the patient the major aspects of the surgery including use of general anesthesia and cardiopulmonary bypass, the location of the surgical incisions, the expected postoperative recovery.  I discussed the risks to him of the operation including the risks of stroke, MI, bleeding requiring blood, transfusion requirements, postoperative pulmonary problems including pleural effusions, serious infections, or death.  After reviewing these issues, he demonstrated his understanding and agreed to proceed with surgery under what I felt was an informed consent.  OPERATIVE FINDINGS: 1. Large caliber vein.  The vein was examined on both legs.  The best     vein was taken from the right leg.  The left leg vein was very     severely dilated and not usable. 2. Distal circumflex margin was too small to graft. 3. Severe cardiomegaly preoperatively and diffuse global hypokinesia     which improved following AVR-CABG. 4. No intraoperative blood products were required for this procedure.  DESCRIPTION OF PROCEDURE:  The patient was brought to the operating  room and placed supine on the operating table.  General anesthesia was induced under invasive hemodynamic monitoring.  The chest, abdomen, and legs were prepped with Betadine and draped as a sterile field.  A transesophageal echo probe was placed by the anesthesiologist.  A proper time-out was performed.  A sternal incision was made as the saphenous vein was harvested endoscopically from the right leg.  The left internal mammary artery was harvested as a pedicle graft from its origin at the subclavian vessels.  It  had good flow.  The right greater saphenous vein was harvested endoscopically.  The sternal retractor was placed using the deep blades due to the patient's obese body habitus.  The pericardium was opened and suspended.  Pursestrings were placed in the ascending aorta and right atrium and heparin was administered.  When the ACT was documented as being therapeutic, the patient was cannulated and placed on cardiopulmonary bypass.  The coronaries were identified for grafting and cardioplegia cannulas were placed for both antegrade and retrograde cold blood cardioplegia.  The mammary artery and vein grafts were prepared for the distal anastomoses and the patient was cooled to 32 degrees.  The aortic crossclamp was applied. 1.2 L of cold blood cardioplegia was delivered in split doses between the antegrade aortic, retrograde coronary sinus catheters.  There was good cardioplegic arrest and septal temperature dropped less than 14 degrees.  Cardioplegia was delivered every 20 minutes or less while the crossclamp was in place.  The distal coronary anastomoses were performed.  The first distal anastomosis was to the distal RCA.  This was a 1.75 mm vessel, proximal 99% stenosis.  A reverse saphenous vein of an increased caliber was sewn end-to-side with running 7-0 Prolene with good flow through the graft.  Cardioplegia was redosed.  The second distal anastomosis was to the diagonal branch to LAD.  This was a 1.5 mm vessel, proximal 80% stenosis.  A reverse saphenous vein of increased caliber was sewn end-to-side with running 7-0 Prolene.  There was good flow through the graft.  Cardioplegia was redosed.  The third distal anastomosis was the distal LAD.  It was intramyocardial more proximally.  There was a 50% to 70% stenosis of the distal third and the anastomosis was placed beyond that area.  The left IMA was brought through an opening in the left lateral pericardium, was brought down onto the  LAD and sewn end-to-side with running 8-0 Prolene.  There was good flow through the anastomosis after briefly releasing the pedicle bulldog on the mammary artery.  The bulldog was reapplied and the pedicle was secured to the epicardium with 6-0 Prolene. Cardioplegia was redosed.  Attention was then directed to the aortic valve.  A transverse aortotomy was performed.  The aortic valve was inspected.  It had 3 leaflets which were heavily calcified.  These were excised and the heavy calcified annulus was carefully debrided.  The outflow tract was irrigated with copious amounts of cold saline.  The annulus was sized to a 23 mm Magna Ease valve.  Subannular 2-0 Ethibond pledgeted sutures were placed around the annulus, numbering 15 total.  The valve was prepared according to manufacturer's recommendations and protocol.  The sutures were then placed through the sewing ring of the valve and the valve was seated and the sutures were tied.  There was good confirmation of the valve to the annulus and no spaces were identified.  The coronary ostia were widely patent.  The aortotomy was closed in 2 layers with  running 4- 0 Prolene.  The patient was rewarmed and reperfused.  A dose of retrograde warm blood cardioplegia was used to remove any air from the left side of the heart or coronaries.  The crossclamp was then removed.  The vein grafts were de-aired and opened.  The cardioplegia cannula was removed.  The heart was cardioverted back to a regular rhythm.  Proximal and distal anastomoses were inspected and hemostasis was provided.  The patient was rewarmed and reperfused and temporary pacing wires were applied.  The lungs were expanded and ventilator was resumed.  The patient was reperfused and rewarmed.  Low-dose milrinone and dopamine were started and the patient was weaned successfully from cardiopulmonary bypass.  Global LV function and especially RV function have improved from preop echo.   Protamine was administered without adverse reaction.  The cannula was removed.  The mediastinum was irrigated.  Hemostasis was obtained.  The intramediastinal and left pleural chest tubes were placed and brought out through separate incisions.  The superior pericardial fat was closed over the aorta.  The sternum was closed with interrupted wire.  The pectoralis fascia was closed in running #1 Vicryl.  The subcutaneous and skin layers were closed in running Vicryl.  Sterile dressings were applied.  Total cardiopulmonary bypass time was 170 minutes.     Ivin Poot, M.D.     PV/MEDQ  D:  03/24/2015  T:  03/28/2015  Job:  [id number]  cc:   Carlena Bjornstad, MD, [facility]

## 2015-03-29 ENCOUNTER — Inpatient Hospital Stay (HOSPITAL_COMMUNITY): Payer: Medicare Other

## 2015-03-29 LAB — GLUCOSE, CAPILLARY
Glucose-Capillary: 112 mg/dL — ABNORMAL HIGH (ref 65–99)
Glucose-Capillary: 119 mg/dL — ABNORMAL HIGH (ref 65–99)
Glucose-Capillary: 104 mg/dL — ABNORMAL HIGH (ref 65–99)
Glucose-Capillary: 127 mg/dL — ABNORMAL HIGH (ref 65–99)
Glucose-Capillary: 133 mg/dL — ABNORMAL HIGH (ref 65–99)
Glucose-Capillary: 143 mg/dL — ABNORMAL HIGH (ref 65–99)
Glucose-Capillary: 121 mg/dL — ABNORMAL HIGH (ref 65–99)
Glucose-Capillary: 115 mg/dL — ABNORMAL HIGH (ref 65–99)

## 2015-03-29 LAB — CBC
HCT: 39 % (ref 39.0–52.0)
Hemoglobin: 13.7 g/dL (ref 13.0–17.0)
MCH: 32.8 pg (ref 26.0–34.0)
MCHC: 35.1 g/dL (ref 30.0–36.0)
MCV: 93.3 fL (ref 78.0–100.0)
Platelets: 92 10*3/uL — ABNORMAL LOW (ref 150–400)
RBC: 4.18 MIL/uL — ABNORMAL LOW (ref 4.22–5.81)
RDW: 13.5 % (ref 11.5–15.5)
WBC: 20.9 10*3/uL — ABNORMAL HIGH (ref 4.0–10.5)

## 2015-03-29 LAB — POCT I-STAT, CHEM 8
BUN: 23 mg/dL — ABNORMAL HIGH (ref 6–20)
Calcium, Ion: 1.2 mmol/L (ref 1.13–1.30)
Chloride: 93 mmol/L — ABNORMAL LOW (ref 101–111)
Creatinine, Ser: 0.9 mg/dL (ref 0.61–1.24)
Glucose, Bld: 141 mg/dL — ABNORMAL HIGH (ref 65–99)
HCT: 44 % (ref 39.0–52.0)
Hemoglobin: 15 g/dL (ref 13.0–17.0)
Potassium: 3.8 mmol/L (ref 3.5–5.1)
Sodium: 133 mmol/L — ABNORMAL LOW (ref 135–145)
TCO2: 26 mmol/L (ref 0–100)

## 2015-03-29 LAB — BASIC METABOLIC PANEL
Anion gap: 7 (ref 5–15)
BUN: 14 mg/dL (ref 6–20)
CO2: 24 mmol/L (ref 22–32)
Calcium: 7.8 mg/dL — ABNORMAL LOW (ref 8.9–10.3)
Chloride: 99 mmol/L — ABNORMAL LOW (ref 101–111)
Creatinine, Ser: 0.88 mg/dL (ref 0.61–1.24)
GFR calc Af Amer: >60 mL/min (ref >60)
GFR calc non Af Amer: >60 mL/min (ref >60)
Glucose, Bld: 130 mg/dL — ABNORMAL HIGH (ref 65–99)
Potassium: 4.4 mmol/L (ref 3.5–5.1)
Sodium: 130 mmol/L — ABNORMAL LOW (ref 135–145)

## 2015-03-29 LAB — PROTIME-INR
INR: 1.39 (ref 0.00–1.49)
PROTHROMBIN TIME: 17.2 s — AB (ref 11.6–15.2)

## 2015-03-29 MED ORDER — SODIUM CHLORIDE 0.9 % IJ SOLN
10.0000 mL | Freq: Two times a day (BID) | INTRAMUSCULAR | Status: DC
  Administered 2015-03-29 (×2): 10 mL
  Administered 2015-03-30: 20 mL
  Administered 2015-03-30 – 2015-03-31 (×2): 10 mL

## 2015-03-29 MED ORDER — FUROSEMIDE 10 MG/ML IJ SOLN
40.0000 mg | Freq: Once | INTRAMUSCULAR | Status: AC
  Administered 2015-03-29: 40 mg via INTRAVENOUS
  Filled 2015-03-29: qty 4

## 2015-03-29 MED ORDER — SODIUM CHLORIDE 0.9 % IJ SOLN
10.0000 mL | INTRAMUSCULAR | Status: DC | PRN
Start: 2015-03-29 — End: 2015-03-31
  Administered 2015-03-31: 10 mL
  Filled 2015-03-29: qty 40

## 2015-03-29 MED ORDER — AMIODARONE HCL IN DEXTROSE 360-4.14 MG/200ML-% IV SOLN
30.0000 mg/h | INTRAVENOUS | Status: DC
  Administered 2015-03-30 – 2015-03-31 (×6): 30 mg/h via INTRAVENOUS
  Filled 2015-03-29 (×14): qty 200

## 2015-03-29 MED ORDER — SODIUM CHLORIDE 0.9 % IV SOLN: INTRAVENOUS | Status: DC

## 2015-03-29 MED ORDER — FUROSEMIDE 10 MG/ML IJ SOLN
40.0000 mg | Freq: Two times a day (BID) | INTRAMUSCULAR | Status: DC
  Administered 2015-03-29 – 2015-03-31 (×4): 40 mg via INTRAVENOUS
  Filled 2015-03-29 (×5): qty 4

## 2015-03-29 MED ORDER — MAGNESIUM SULFATE 2 GM/50ML IV SOLN
2.0000 g | Freq: Once | INTRAVENOUS | Status: AC
  Administered 2015-03-29: 2 g via INTRAVENOUS
  Filled 2015-03-29: qty 50

## 2015-03-29 MED ORDER — WARFARIN SODIUM 5 MG PO TABS
5.0000 mg | ORAL_TABLET | Freq: Every day | ORAL | Status: DC
  Administered 2015-03-29 – 2015-03-30 (×2): 5 mg via ORAL
  Filled 2015-03-29 (×4): qty 1

## 2015-03-29 MED ORDER — MILRINONE IN DEXTROSE 20 MG/100ML IV SOLN
0.1250 ug/kg/min | INTRAVENOUS | Status: DC
  Administered 2015-03-29: 0.125 ug/kg/min via INTRAVENOUS

## 2015-03-29 MED ORDER — POTASSIUM CHLORIDE 10 MEQ/50ML IV SOLN
10.0000 meq | INTRAVENOUS | Status: AC
  Administered 2015-03-29 (×2): 10 meq via INTRAVENOUS
  Filled 2015-03-29: qty 50

## 2015-03-29 MED ORDER — AMIODARONE HCL IN DEXTROSE 360-4.14 MG/200ML-% IV SOLN
60.0000 mg/h | INTRAVENOUS | Status: AC
  Administered 2015-03-29 (×2): 60 mg/h via INTRAVENOUS
  Filled 2015-03-29 (×2): qty 200

## 2015-03-29 MED ORDER — AMIODARONE LOAD VIA INFUSION
150.0000 mg | Freq: Once | INTRAVENOUS | Status: AC
  Administered 2015-03-29: 150 mg via INTRAVENOUS
  Filled 2015-03-29: qty 83.34

## 2015-03-29 MED ORDER — MAGNESIUM OXIDE 400 (241.3 MG) MG PO TABS
400.0000 mg | ORAL_TABLET | Freq: Every day | ORAL | Status: DC
  Administered 2015-03-29 – 2015-03-31 (×3): 400 mg via ORAL
  Filled 2015-03-29 (×4): qty 1

## 2015-03-29 MED ORDER — METOCLOPRAMIDE HCL 5 MG/ML IJ SOLN
10.0000 mg | Freq: Four times a day (QID) | INTRAMUSCULAR | Status: AC
  Administered 2015-03-29 (×4): 10 mg via INTRAVENOUS
  Filled 2015-03-29 (×3): qty 2

## 2015-03-29 MED ORDER — CETYLPYRIDINIUM CHLORIDE 0.05 % MT LIQD
7.0000 mL | Freq: Two times a day (BID) | OROMUCOSAL | Status: DC
  Administered 2015-03-29 – 2015-03-31 (×4): 7 mL via OROMUCOSAL

## 2015-03-29 NOTE — Clinical Documentation Improvement (Signed)
  OP documents "class III CHF". Please be more specific and add to next Note/DS the acuity and the type.  . Document acuity --Acute --Chronic --Acute on Chronic . Document type --Diastolic --Systolic --Combined systolic and diastolic . Due to or associated with --Cardiac or other surgery --Hypertension --Valvular disease --Rheumatic heart disease --Other (specify) --Unable to determine  Thank Dennis Bast  [facility] HIM department

## 2015-03-29 NOTE — Progress Notes (Addendum)
2 Days Post-Op Procedure(s) (LRB): AORTIC VALVE REPLACEMENT (AVR) (N/A) CORONARY ARTERY BYPASS GRAFTING (CABG), ON PUMP, TIMES THREE, USING LEFT INTERNAL MAMMARY ARTERY, RIGHT GREATER SAPHENOUS VEIN HARVESTED ENDOSCOPICALLY (N/A) TRANSESOPHAGEAL ECHOCARDIOGRAM (TEE) (N/A) Subjective: S/pAVR - CABG for severe acute on chronic AS with combined systolic and diastolic heart failure nsr with ectopy C/o back discomfort CXR clear Min chest tube output  Objective: Vital signs in last 24 hours: Temp:  [98.2 F (36.8 C)-99.3 F (37.4 C)] 98.9 F (37.2 C) (07/27 0700) Pulse Rate:  [84-103] 95 (07/27 0700) Cardiac Rhythm:  [-] Normal sinus rhythm;A-V Sequential paced (07/27 0700) Resp:  [16-30] 30 (07/27 0700) BP: (87-132)/(52-92) 123/92 mmHg (07/27 0700) SpO2:  [92 %-100 %] 97 % (07/27 0758) Arterial Line BP: (106-151)/(56-66) 112/60 mmHg (07/26 1400) Weight:  [271 lb 9.7 oz (123.2 kg)] 271 lb 9.7 oz (123.2 kg) (07/27 0500)  Hemodynamic parameters for last 24 hours: PAP: (30-36)/(21-30) 35/30 mmHg CO:  [4.5 L/min] 4.5 L/min CI:  [1.9 L/min/m2] 1.9 L/min/m2  Intake/Output from previous day: 07/26 0701 - 07/27 0700 In: 1673.3 [P.O.:480; I.V.:793.3; IV Piggyback:400] Out: 1745 [Urine:1565; Chest Tube:180] Intake/Output this shift: Total I/O In: -  Out: 975 [Urine:975]  Lungs clear Neuro intact abd sl distended  Lab Results:  Recent Labs  03/28/15 1615 03/29/15 0337  WBC 16.1* 20.9*  HGB 13.6 13.7  HCT 39.0 39.0  PLT 109* 92*   BMET:  Recent Labs  03/28/15 0430 03/28/15 1610 03/28/15 1615 03/29/15 0337  NA 135 134*  --  130*  K 3.7 3.9  --  4.4  CL 105 97*  --  99*  CO2 24  --   --  24  GLUCOSE 143* 139*  --  130*  BUN 11 15  --  14  CREATININE 0.73 0.80 0.97 0.88  CALCIUM 7.8*  --   --  7.8*    PT/INR:  Recent Labs  03/29/15 0337  LABPROT 17.2*  INR 1.39   ABG    Component Value Date/Time   PHART 7.392 03/28/2015 0404   HCO3 23.3 03/28/2015 0404   TCO2 22 03/28/2015 1610   ACIDBASEDEF 1.0 03/28/2015 0404   O2SAT 93.0 03/28/2015 0404   CBG (last 3)   Recent Labs  03/28/15 2125 03/28/15 2348 03/29/15 0341  GLUCAP 112* 118* 119*    Assessment/Plan: S/P Procedure(s) (LRB): AORTIC VALVE REPLACEMENT (AVR) (N/A) CORONARY ARTERY BYPASS GRAFTING (CABG), ON PUMP, TIMES THREE, USING LEFT INTERNAL MAMMARY ARTERY, RIGHT GREATER SAPHENOUS VEIN HARVESTED ENDOSCOPICALLY (N/A) TRANSESOPHAGEAL ECHOCARDIOGRAM (TEE) (N/A) Mobilize Diuresis Diabetes control See progression orders  Follow elevated WBC without fever Cont iv reglan another day Cont coumadin for hx DVT   LOS: 2 days    Tharon Aquas Trigt III 03/29/2015

## 2015-03-29 NOTE — Progress Notes (Signed)
  Amiodarone Drug - Drug Interaction Consult Note  Recommendations: -Monitor HR as Lopressor and amiodarone can cause bradycardia. -Ensure potassium remains in goal range with patient on Lasix -pharmacy monitoring warfarin dosing and will continue to do so. We will adjust out plan as needed by INR  Amiodarone is metabolized by the cytochrome P450 system and therefore has the potential to cause many drug interactions. Amiodarone has an average plasma half-life of 50 days (range 20 to 100 days).   There is potential for drug interactions to occur several weeks or months after stopping treatment and the onset of drug interactions may be slow after initiating amiodarone.   []  Statins: Increased risk of myopathy. Simvastatin- restrict dose to 20mg  daily. Other statins: counsel patients to report any muscle pain or weakness immediately.  [x]  Anticoagulants: Amiodarone can increase anticoagulant effect. Consider warfarin dose reduction. Patients should be monitored closely and the dose of anticoagulant altered accordingly, remembering that amiodarone levels take several weeks to stabilize.  []  Antiepileptics: Amiodarone can increase plasma concentration of phenytoin, the dose should be reduced. Note that small changes in phenytoin dose can result in large changes in levels. Monitor patient and counsel on signs of toxicity.  [x]  Beta blockers: increased risk of bradycardia, AV block and myocardial depression. Sotalol - avoid concomitant use.  []   Calcium channel blockers (diltiazem and verapamil): increased risk of bradycardia, AV block and myocardial depression.  []   Cyclosporine: Amiodarone increases levels of cyclosporine. Reduced dose of cyclosporine is recommended.  []  Digoxin dose should be halved when amiodarone is started.  [x]  Diuretics: increased risk of cardiotoxicity if hypokalemia occurs.  []  Oral hypoglycemic agents (glyburide, glipizide, glimepiride): increased risk of  hypoglycemia. Patient's glucose levels should be monitored closely when initiating amiodarone therapy.   []  Drugs that prolong the QT interval:  Torsades de pointes risk may be increased with concurrent use - avoid if possible.  Monitor QTc, also keep magnesium/potassium WNL if concurrent therapy can't be avoided. Marland Kitchen Antibiotics: e.g. fluoroquinolones, erythromycin. . Antiarrhythmics: e.g. quinidine, procainamide, disopyramide, sotalol. . Antipsychotics: e.g. phenothiazines, haloperidol.  . Lithium, tricyclic antidepressants, and methadone.   Cindra Austad D. Terasa Orsini, PharmD, BCPS Clinical Pharmacist Pager: [phone] 03/29/2015 6:35 PM

## 2015-03-29 NOTE — Progress Notes (Signed)
CT surgery p.m. Rounds  Patient feels better and stronger after a slow start this a.m. He was out of bed to chair Patient developed atrial fibrillation this evening and was placed on IV amiodarone protocol Adequate urine output with IV Lasix diuresis P.m. 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

## 2015-03-29 NOTE — Op Note (Signed)
NAME:  DONDRELL, LOUDERMILK NO.:  [id number]  MEDICAL RECORD NO.:  [id number]  LOCATION:  2S01C                        FACILITY:  [facility]  PHYSICIAN:  Ala Dach, M.D.DATE OF BIRTH:  September 17, 1950  DATE OF PROCEDURE:  03/24/2015 DATE OF DISCHARGE:                              OPERATIVE REPORT   INDICATION FOR PROCEDURE:  Mr. Hyser is a 64 year old gentleman, a patient of Dr. Tharon Aquas Trigt's who presents today for coronary artery bypass grafting and aortic valve replacement.  He is brought to the holding area the morning of surgery where under local anesthesia with sedation, pulmonary artery and radial arterial lines were placed.  He was then moved to the OR for routine induction of general anesthesia after which the TEE probe was prepared and passed oropharyngeally into the stomach, then slightly withdrawn for imaging of the cardiac structures.  PRE-CARDIOPULMONARY BYPASS TEE EXAMINATION:  Left Ventricle:  The left ventricular chamber is seen initially in the short axis view.  There is moderate concentric left ventricular hypertrophy appreciated.  The overall estimated ejection fraction is satisfactory to good, estimated to be approximately 50%.  There is some area of inferior to posterior wall hypokinesis appreciated.  Papillary muscles are well outlined.  No other masses are appreciated.  Mitral Valve:  In the four-chamber view, we viewed the mitral valve. Mildly thickened mitral valve leaflets are appreciated.  Overall, there is normal excursion to the leaflets and normal opening.  On color Doppler, there is no mitral regurgitant flow appreciated.  Left Atrium:  Normal left atrial chamber in size and shape.  The left atrial appendage is noted and clear.  The interatrial septum is lipomatous, but is intact.  Aortic Valve:  The aortic valve is seen initially in the short axis view.  There is severe diffuse thickening and calcium over this trileaflet aortic  valve.  There is significantly limited mobility appreciated.  This is associated with what appears to be severe aortic stenosis.  Color Doppler shows 4+ stenotic jet above the aortic valve level.  There is also mild aortic insufficiency appreciated centrally. Deep gastric views were able to allow [place] to estimate peak gradient, these are about 60-62, mean gradient is about 40 mmHg, calculated aortic valve area is 0.6-0.7 mm.  Right Ventricle:  The right ventricular chamber is a thin-walled normal- sized chamber, overall contractility is good and satisfactory.  Right Atrium:  Normal chamber.  Pulmonary artery noted within.  Tricuspid Valve:  Thin compliant mobile tricuspid valve with very mild TR associated on color Doppler.  The patient placed on cardiopulmonary bypass, coronary artery bypass grafting is carried out.  The aortotomy is performed with a #23 tissue valve placed in the aortic position.  The patient is rewarmed, de-airing maneuvers are carried out, and separated from cardiopulmonary bypass with the initial attempt.  POST CARDIOPULMONARY BYPASS TEE EXAMINATION:  Left Ventricle:  In the early bypass period, the left ventricular chamber was significantly sluggish in its contractile pattern.  Overall it was a moderataely depressed left ventricular chamber.  With time and separation from cardiopulmonary bypass, this improved dramatically with good overall contractile pattern ultimately appreciated in short axis and long axis views.  Right Ventricle:  The right ventricular chamber also was slightly sluggish in its contractile pattern, but again with time it became vigorous 5-10 minutes from the time of separation from cardiopulmonary bypass.  In the areas of the tricuspid mitral valve noted was only trivial regurgitation similar to before.  Now in the aortic position, it could be seen the replaced pericardial tissue valve.  It was well seated, opened satisfactorily during  systolic contraction.  On diastole, there was appropriate closure with no perivalvular leaks or flows.  It appears to be seated well and functioning in an entirely appropriate manner.  The rest of the cardiac examination was as previously described and the patient was returned ultimately to the Cardiac Intensive Care Unit in stable condition.          ______________________________ Ala Dach, M.D.     JTM/MEDQ  D:  03/28/2015  T:  03/29/2015  Job:  [id number]

## 2015-03-29 NOTE — Progress Notes (Signed)
Mediastinal chest tube removed.  DSD over site.  Patient tol. Procedure well.  Portable CXR ordered as per protocol.

## 2015-03-29 NOTE — Progress Notes (Signed)
Peripherally Inserted Central Catheter/Midline Placement  The IV Nurse has discussed with the patient and/or persons authorized to consent for the patient, the purpose of this procedure and the potential benefits and risks involved with this procedure.  The benefits include less needle sticks, lab draws from the catheter and patient may be discharged home with the catheter.  Risks include, but not limited to, infection, bleeding, blood clot (thrombus formation), and puncture of an artery; nerve damage and irregular heat beat.  Alternatives to this procedure were also discussed.  PICC/Midline Placement Documentation        Maxie Slovacek, Nicolette Bang 03/29/2015, 1:54 PM

## 2015-03-30 ENCOUNTER — Inpatient Hospital Stay (HOSPITAL_COMMUNITY): Payer: Medicare Other

## 2015-03-30 LAB — CBC
HCT: 43 % (ref 39.0–52.0)
Hemoglobin: 15 g/dL (ref 13.0–17.0)
MCH: 32.8 pg (ref 26.0–34.0)
MCHC: 34.9 g/dL (ref 30.0–36.0)
MCV: 94.1 fL (ref 78.0–100.0)
Platelets: 124 10*3/uL — ABNORMAL LOW (ref 150–400)
RBC: 4.57 MIL/uL (ref 4.22–5.81)
RDW: 13.4 % (ref 11.5–15.5)
WBC: 15.3 10*3/uL — AB (ref 4.0–10.5)

## 2015-03-30 LAB — COMPREHENSIVE METABOLIC PANEL
ALT: 19 U/L (ref 17–63)
AST: 32 U/L (ref 15–41)
Albumin: 2.6 g/dL — ABNORMAL LOW (ref 3.5–5.0)
Alkaline Phosphatase: 42 U/L (ref 38–126)
Anion gap: 7 (ref 5–15)
BILIRUBIN TOTAL: 1.5 mg/dL — AB (ref 0.3–1.2)
BUN: 22 mg/dL — ABNORMAL HIGH (ref 6–20)
CO2: 29 mmol/L (ref 22–32)
Calcium: 8.4 mg/dL — ABNORMAL LOW (ref 8.9–10.3)
Chloride: 96 mmol/L — ABNORMAL LOW (ref 101–111)
Creatinine, Ser: 0.93 mg/dL (ref 0.61–1.24)
GFR calc Af Amer: >60 mL/min (ref >60)
Glucose, Bld: 130 mg/dL — ABNORMAL HIGH (ref 65–99)
Potassium: 4 mmol/L (ref 3.5–5.1)
SODIUM: 132 mmol/L — AB (ref 135–145)
TOTAL PROTEIN: 5.8 g/dL — AB (ref 6.5–8.1)

## 2015-03-30 LAB — GLUCOSE, CAPILLARY
Glucose-Capillary: 126 mg/dL — ABNORMAL HIGH (ref 65–99)
Glucose-Capillary: 145 mg/dL — ABNORMAL HIGH (ref 65–99)
Glucose-Capillary: 152 mg/dL — ABNORMAL HIGH (ref 65–99)
Glucose-Capillary: 151 mg/dL — ABNORMAL HIGH (ref 65–99)
Glucose-Capillary: 115 mg/dL — ABNORMAL HIGH (ref 65–99)

## 2015-03-30 LAB — BASIC METABOLIC PANEL
Anion gap: 11 (ref 5–15)
BUN: 31 mg/dL — ABNORMAL HIGH (ref 6–20)
CALCIUM: 8.4 mg/dL — AB (ref 8.9–10.3)
CHLORIDE: 94 mmol/L — AB (ref 101–111)
CO2: 25 mmol/L (ref 22–32)
Creatinine, Ser: 1.04 mg/dL (ref 0.61–1.24)
GFR calc non Af Amer: >60 mL/min (ref >60)
Glucose, Bld: 142 mg/dL — ABNORMAL HIGH (ref 65–99)
Potassium: 3.7 mmol/L (ref 3.5–5.1)
Sodium: 130 mmol/L — ABNORMAL LOW (ref 135–145)

## 2015-03-30 LAB — PROTIME-INR
INR: 1.52 — AB (ref 0.00–1.49)
PROTHROMBIN TIME: 18.4 s — AB (ref 11.6–15.2)

## 2015-03-30 LAB — MAGNESIUM: Magnesium: 2.4 mg/dL (ref 1.7–2.4)

## 2015-03-30 MED ORDER — MORPHINE SULFATE 2 MG/ML IJ SOLN: 2.0000 mg | INTRAMUSCULAR | Status: DC | PRN

## 2015-03-30 MED ORDER — AMIODARONE IV BOLUS ONLY 150 MG/100ML: 150.0000 mg | Freq: Once | INTRAVENOUS | Status: AC

## 2015-03-30 MED ORDER — BISACODYL 10 MG RE SUPP: 10.0000 mg | Freq: Once | RECTAL | Status: DC

## 2015-03-30 MED ORDER — HALOPERIDOL LACTATE 5 MG/ML IJ SOLN
2.0000 mg | Freq: Four times a day (QID) | INTRAMUSCULAR | Status: DC | PRN
  Administered 2015-03-30: 2 mg via INTRAVENOUS
  Filled 2015-03-30: qty 1

## 2015-03-30 MED ORDER — INSULIN ASPART 100 UNIT/ML ~~LOC~~ SOLN: 0.0000 [IU] | Freq: Every day | SUBCUTANEOUS | Status: DC

## 2015-03-30 MED ORDER — DEXTROSE 5 % IV SOLN
1.0000 g | Freq: Three times a day (TID) | INTRAVENOUS | Status: DC
  Administered 2015-03-30 – 2015-03-31 (×4): 1 g via INTRAVENOUS
  Filled 2015-03-30 (×7): qty 1

## 2015-03-30 MED ORDER — ALTEPLASE 100 MG IV SOLR
2.0000 mg | Freq: Once | INTRAVENOUS | Status: DC
  Filled 2015-03-30: qty 2

## 2015-03-30 MED ORDER — AMIODARONE HCL 200 MG PO TABS
400.0000 mg | ORAL_TABLET | Freq: Two times a day (BID) | ORAL | Status: DC
  Administered 2015-03-30 – 2015-03-31 (×2): 400 mg via ORAL
  Filled 2015-03-30 (×5): qty 2

## 2015-03-30 MED ORDER — METOCLOPRAMIDE HCL 5 MG/ML IJ SOLN
10.0000 mg | Freq: Four times a day (QID) | INTRAMUSCULAR | Status: DC
  Filled 2015-03-30: qty 2

## 2015-03-30 MED ORDER — INSULIN ASPART 100 UNIT/ML ~~LOC~~ SOLN
0.0000 [IU] | Freq: Three times a day (TID) | SUBCUTANEOUS | Status: DC
  Administered 2015-03-30: 2 [IU] via SUBCUTANEOUS
  Administered 2015-03-30 (×2): 3 [IU] via SUBCUTANEOUS
  Administered 2015-03-31 (×2): 2 [IU] via SUBCUTANEOUS

## 2015-03-30 NOTE — Progress Notes (Signed)
Rehab Admissions Coordinator Note:  Patient was screened by Retta Diones for appropriateness for an Inpatient Acute Rehab Consult.  At this time, we are recommending Inpatient Rehab consult.  Jodell Cipro M 03/30/2015, 1:55 PM  I can be reached at [phone].

## 2015-03-30 NOTE — Progress Notes (Signed)
Patient ID: Chris Reyes, male   DOB: 1950/11/12, 64 y.o.   MRN: [id number]  Hemodynamically stable but still in A-fib 119 on amio. He received a bolus of 150 mg early this afternoon. He is still on Milrinone 0.125.  Urine output ok  Will give him another bolus this evening.

## 2015-03-30 NOTE — Evaluation (Signed)
Physical Therapy Evaluation  (see also separate Vestibular Evaluation note) Patient Details Name: Chris Reyes MRN: [id number] DOB: 19-Jan-1951 Today's Date: 03/30/2015   History of Present Illness  Adm 7/25 for AVR and CABG. POD 1 ambulated 150 ft with nursing. POD 2 developed vertigo. 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 Currently etiology of vertigo is unclear--unable to elicit nystagmus or vertigo with BPPV testing of all 6 canals. ? Due to meclizine or ? Due to central vertigo. Discussed possibility of central/neuro cause with Dr. Prescott Gum. He agreed to d/c meclizine to assist with further peripheral testing. Patient will benefit from skilled PT to increase their independence and safety with mobility to allow discharge to the venue listed below.       Follow Up Recommendations CIR;Supervision/Assistance - 24 hour    Equipment Recommendations  Rolling walker with 5" wheels    Recommendations for Other Services Rehab consult;OT consult     Precautions / Restrictions Precautions Precautions: Sternal;Fall      Mobility  Bed Mobility Overal bed mobility: Needs Assistance;+2 for physical assistance Bed Mobility: Rolling;Supine to Sit;Sit to Supine Rolling: Mod assist   Supine to sit: Max assist;+2 for physical assistance;HOB elevated Sit to supine: Total assist;+2 for physical assistance;+2 for safety/equipment   General bed mobility comments: after BPPV testing, brought to chair position and pivoted to sit at EOB. Pt immediately reported dizziness. SEE vestibular report  Transfers                    Ambulation/Gait                Stairs            Wheelchair Mobility    Modified Rankin (Stroke Patients Only)        Balance Overall balance assessment: Needs assistance Sitting-balance support: No upper extremity supported;Feet supported Sitting balance-Leahy Scale: Zero Sitting balance - Comments: pushes to his right with LLE in abduction, internal rotation and pushign Postural control: Right lateral lean                                   Pertinent Vitals/Pain HR 102-126 SaO2>92% on [place] O2  Pain Assessment: 0-10 Pain Score: 6  Pain Location: ches Pain Descriptors / Indicators: Operative site guarding Pain Intervention(s): Limited activity within patient's tolerance;Monitored during session;Repositioned;Patient requesting pain meds-RN notified    Home Living Family/patient expects to be discharged to:: Private residence Living Arrangements: Spouse/significant other (fiance) Available Help at Discharge: Family;Available 24 hours/day Type of Home: House Home Access: Stairs to enter Entrance Stairs-Rails: None Entrance Stairs-Number of Steps: 2 Home Layout: One level Home Equipment: Grab bars - tub/shower (reports he returned previously borrowed equipment)      Prior Function Level of Independence: Independent         Comments: retired; built Environmental manager   Dominant Hand: Left    Extremity/Trunk Assessment   Upper Extremity Assessment: Overall WFL for tasks assessed (sternal precautions)           Lower Extremity Assessment: RLE deficits/detail;LLE deficits/detail RLE Deficits / Details: limited by incisional pain; AAROM WFL LLE Deficits / Details: limited by incisional  pain; AAROM WFL  Cervical / Trunk Assessment: Normal  Communication   Communication: No difficulties  Cognition Arousal/Alertness: Awake/alert Behavior During Therapy: WFL for tasks assessed/performed Overall Cognitive Status: Within Functional Limits for tasks assessed                      General Comments      Exercises        Assessment/Plan    PT  Assessment Patient needs continued PT services  PT Diagnosis Difficulty walking;Acute pain (vertigo)   PT Problem List Decreased range of motion;Decreased activity tolerance;Decreased balance;Decreased mobility;Decreased knowledge of use of DME;Decreased knowledge of precautions;Cardiopulmonary status limiting activity;Pain  PT Treatment Interventions DME instruction;Gait training;Stair training;Functional mobility training;Therapeutic activities;Therapeutic exercise;Balance training;Neuromuscular re-education;Patient/family education;Other (comment) (vestibular rehab)   PT Goals (Current goals can be found in the Care Plan section) Acute Rehab PT Goals Patient Stated Goal: stop feeling like he's spinning or falling backwards PT Goal Formulation: With patient Time For Goal Achievement: 04/06/15 Potential to Achieve Goals: Good    Frequency Min 3X/week   Barriers to discharge        Co-evaluation               End of Session Equipment Utilized During Treatment: Oxygen Activity Tolerance: Treatment limited secondary to medical complications (Comment) (vertigo) Patient left: in bed;with call bell/phone within reach;with family/visitor present Nurse Communication: Mobility status;Other (comment) (vertigo; unsuccessful treatment; use chair position)         Time: [phone] (time adjusted to include earlier visit) PT Time Calculation (min) (ACUTE ONLY): 56 min   Charges:   PT Evaluation $Initial PT Evaluation Tier I: 1 Procedure PT Treatments $Therapeutic Activity: 8-22 mins   PT G Codes:        Sheridyn Canino Apr 06, 2015, 1:04 PM Pager [phone]

## 2015-03-30 NOTE — Progress Notes (Signed)
3 Days Post-Op Procedure(s) (LRB): AORTIC VALVE REPLACEMENT (AVR) (N/A) CORONARY ARTERY BYPASS GRAFTING (CABG), ON PUMP, TIMES THREE, USING LEFT INTERNAL MAMMARY ARTERY, RIGHT GREATER SAPHENOUS VEIN HARVESTED ENDOSCOPICALLY (N/A) TRANSESOPHAGEAL ECHOCARDIOGRAM (TEE) (N/A) Subjective: Slow progress after aVR-CABG Patient complains of weakness, dizziness-vertigo, and nausea. Poor mobility to get from bed to chair. Hiccups through the night interfered with sleeping. Continues in atrial fibrillation on IV amiodarone. Coumadin being re-started for history of DVT and PE in the past  Objective: Vital signs in last 24 hours: Temp:  [97.7 F (36.5 C)-98.8 F (37.1 C)] 98.7 F (37.1 C) (07/28 0400) Pulse Rate:  [31-115] 111 (07/28 1100) Cardiac Rhythm:  [-] Atrial fibrillation (07/28 0800) Resp:  [0-35] 35 (07/28 1100) BP: (87-140)/(48-98) 116/93 mmHg (07/28 1100) SpO2:  [94 %-98 %] 94 % (07/28 1100) Weight:  [262 lb 5.6 oz (119 kg)] 262 lb 5.6 oz (119 kg) (07/28 0500)  Hemodynamic parameters for last 24 hours:   stable blood pressure on low-dose milrinone  Intake/Output from previous day: 07/27 0701 - 07/28 0700 In: 1312.3 [P.O.:120; I.V.:892.3; IV Piggyback:200] Out: 2635 [Urine:2635] Intake/Output this shift: Total I/O In: 335.2 [P.O.:120; I.V.:165.2; IV Piggyback:50] Out: 1125 [Urine:650; Emesis/NG output:475]  Exam Alert and responsive Abdomen soft Breath sounds reduced on left Extremities warm with mild edema  Lab Results:  Recent Labs  03/29/15 0337 03/29/15 1714 03/30/15 0420  WBC 20.9*  --  15.3*  HGB 13.7 15.0 15.0  HCT 39.0 44.0 43.0  PLT 92*  --  124*   BMET:  Recent Labs  03/29/15 0337 03/29/15 1714 03/30/15 0420  NA 130* 133* 132*  K 4.4 3.8 4.0  CL 99* 93* 96*  CO2 24  --  29  GLUCOSE 130* 141* 130*  BUN 14 23* 22*  CREATININE 0.88 0.90 0.93  CALCIUM 7.8*  --  8.4*    PT/INR:  Recent Labs  03/30/15 0420  LABPROT 18.4*  INR 1.52*   ABG   Component Value Date/Time   PHART 7.392 03/28/2015 0404   HCO3 23.3 03/28/2015 0404   TCO2 26 03/29/2015 1714   ACIDBASEDEF 1.0 03/28/2015 0404   O2SAT 93.0 03/28/2015 0404   CBG (last 3)   Recent Labs  03/29/15 1928 03/29/15 2329 03/30/15 0900  GLUCAP 121* 115* 145*    Assessment/Plan: S/P Procedure(s) (LRB): AORTIC VALVE REPLACEMENT (AVR) (N/A) CORONARY ARTERY BYPASS GRAFTING (CABG), ON PUMP, TIMES THREE, USING LEFT INTERNAL MAMMARY ARTERY, RIGHT GREATER SAPHENOUS VEIN HARVESTED ENDOSCOPICALLY (N/A) TRANSESOPHAGEAL ECHOCARDIOGRAM (TEE) (N/A) Mobilize Diuresis Diabetes control Continue amiodarone and re-bolused with 150 mg IV for persistent atrial fibrillation  Continue Coumadin reload for history of DVT Chest x-ray shows infiltrate left lower lung field-with elevated white count we'll start IV Fortaz for possible pneumonia   LOS: 3 days    Tharon Aquas Trigt III 03/30/2015

## 2015-03-30 NOTE — Progress Notes (Signed)
PT Vestibular Evaluation  Pt with h/o BPPV and described undergoing cannalith repositioning with success during previous episode. Stated current dizziness felt like previous episodes. Pt supine in bed and utilized hospital bed functions to achieve testing positions (especially due to recent CABG). No symptoms or nystagmus elicited with testing (including repeated supine head roll with rolling pt on his side due to limited cervical ROM).   Reports sitting or standing elicits symptoms. Assisted to sit on Lt side of bed from chair position and as soon as torso achieved upright over pelvis he stated spinning began. Nystagmus not visible. Symptoms persisted >1 min and pt had begun to lean/?push to his right and pt reported spinning had become a sense of falling backwards. 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 Sat EOB 3 minutes with symptoms persisting and returned to supine.   Discussed with Dr. Prescott Gum holding meclizine and possible central cause (cerebellar). He is considering brain scan, and also seeing if holding meclizine allows peripheral vestibular testing to be more conclusive. May need to use Frenzel lenses if appears to be BPPV.   03/30/15 1308  Vestibular Assessment  General Observation Nursing reports incredibly off balance when trying to stand pt and required +2. Pt reports feeling of spinning "a little" but then feels like someone is pushing him backwards.   Symptom Behavior  Type of Dizziness (spinning and pushed backwards)  Frequency of Dizziness each time he sits up or stands up  Duration of Dizziness as long as he moves  Aggravating Factors Supine to sit;Sit to stand  Relieving Factors Lying supine  Occulomotor Exam  Occulomotor Alignment Normal  Spontaneous Absent  Gaze-induced Absent  Head shaking Horizontal Absent  Head  Shaking Vertical Absent  Smooth Pursuits Intact  Comment Had meclizine prior evening at 2100  Vestibulo-Occular Reflex  VOR 1 Head Only (x 1 viewing) 30 (horizontal and vertical, no symptoms; chair position in bed)  VOR Cancellation (could not assess at EOB due to imbalance)  Positional Testing  Dix-Hallpike Dix-Hallpike Right;Dix-Hallpike Left (utilizing hospital bed for trendelenburg)  Horizontal Canal Testing Horizontal Canal Right;Horizontal Canal Left  Dix-Hallpike Right  Dix-Hallpike Right Duration -  Dix-Hallpike Right Symptoms No nystagmus  Dix-Hallpike Left  Dix-Hallpike Left Duration -  Dix-Hallpike Left Symptoms No nystagmus  Horizontal Canal Right  Horizontal Canal Right Duration -  Horizontal Canal Right Symptoms Normal  Horizontal Canal Left  Horizontal Canal Left Duration -  Horizontal Canal Left Symptoms Normal  Cognition  Cognition Orientation Level Oriented x 4  Positional Sensitivities  Supine to Sitting 4    03/30/2015 Chris Reyes, PT Pager: [phone]

## 2015-03-31 ENCOUNTER — Inpatient Hospital Stay (HOSPITAL_COMMUNITY): Payer: Medicare Other

## 2015-03-31 DIAGNOSIS — K9189 Other postprocedural complications and disorders of digestive system: Secondary | ICD-10-CM

## 2015-03-31 DIAGNOSIS — K567 Ileus, unspecified: Secondary | ICD-10-CM | POA: Insufficient documentation

## 2015-03-31 DIAGNOSIS — Z931 Gastrostomy status: Secondary | ICD-10-CM

## 2015-03-31 DIAGNOSIS — Z954 Presence of other heart-valve replacement: Secondary | ICD-10-CM

## 2015-03-31 DIAGNOSIS — K913 Postprocedural intestinal obstruction: Secondary | ICD-10-CM

## 2015-03-31 DIAGNOSIS — J9600 Acute respiratory failure, unspecified whether with hypoxia or hypercapnia: Secondary | ICD-10-CM | POA: Insufficient documentation

## 2015-03-31 DIAGNOSIS — R0602 Shortness of breath: Secondary | ICD-10-CM | POA: Insufficient documentation

## 2015-03-31 LAB — GLUCOSE, CAPILLARY
Glucose-Capillary: 125 mg/dL — ABNORMAL HIGH (ref 65–99)
Glucose-Capillary: 127 mg/dL — ABNORMAL HIGH (ref 65–99)
Glucose-Capillary: 75 mg/dL (ref 65–99)
Glucose-Capillary: 112 mg/dL — ABNORMAL HIGH (ref 65–99)

## 2015-03-31 LAB — COMPREHENSIVE METABOLIC PANEL
ALBUMIN: 2.6 g/dL — AB (ref 3.5–5.0)
ALK PHOS: 41 U/L (ref 38–126)
ALT: 20 U/L (ref 17–63)
AST: 26 U/L (ref 15–41)
Anion gap: 11 (ref 5–15)
BILIRUBIN TOTAL: 1.4 mg/dL — AB (ref 0.3–1.2)
BUN: 36 mg/dL — AB (ref 6–20)
CALCIUM: 8.8 mg/dL — AB (ref 8.9–10.3)
CO2: 26 mmol/L (ref 22–32)
Chloride: 95 mmol/L — ABNORMAL LOW (ref 101–111)
Creatinine, Ser: 1.06 mg/dL (ref 0.61–1.24)
GFR calc Af Amer: >60 mL/min (ref >60)
GLUCOSE: 135 mg/dL — AB (ref 65–99)
Potassium: 3.9 mmol/L (ref 3.5–5.1)
Sodium: 132 mmol/L — ABNORMAL LOW (ref 135–145)
TOTAL PROTEIN: 7 g/dL (ref 6.5–8.1)

## 2015-03-31 LAB — POCT I-STAT 3, ART BLOOD GAS (G3+)
Acid-base deficit: 1 mmol/L (ref 0.0–2.0)
Acid-base deficit: 5 mmol/L — ABNORMAL HIGH (ref 0.0–2.0)
Bicarbonate: 21.9 mEq/L (ref 20.0–24.0)
Bicarbonate: 18.1 mEq/L — ABNORMAL LOW (ref 20.0–24.0)
O2 Saturation: 100 %
O2 Saturation: 89 %
Patient temperature: 98.3
Patient temperature: 101.6
TCO2: 23 mmol/L (ref 0–100)
TCO2: 19 mmol/L (ref 0–100)
pCO2 arterial: 31.3 mmHg — ABNORMAL LOW (ref 35.0–45.0)
pCO2 arterial: 30.7 mmHg — ABNORMAL LOW (ref 35.0–45.0)
pH, Arterial: 7.451 — ABNORMAL HIGH (ref 7.350–7.450)
pH, Arterial: 7.385 (ref 7.350–7.450)
pO2, Arterial: 256 mmHg — ABNORMAL HIGH (ref 80.0–100.0)
pO2, Arterial: 61 mmHg — ABNORMAL LOW (ref 80.0–100.0)

## 2015-03-31 LAB — POCT I-STAT, CHEM 8
BUN: 54 mg/dL — ABNORMAL HIGH (ref 6–20)
Calcium, Ion: 1.1 mmol/L — ABNORMAL LOW (ref 1.13–1.30)
Chloride: 100 mmol/L — ABNORMAL LOW (ref 101–111)
Creatinine, Ser: 1.7 mg/dL — ABNORMAL HIGH (ref 0.61–1.24)
Glucose, Bld: 82 mg/dL (ref 65–99)
HCT: 57 % — ABNORMAL HIGH (ref 39.0–52.0)
Hemoglobin: 19.4 g/dL — ABNORMAL HIGH (ref 13.0–17.0)
Potassium: 3.5 mmol/L (ref 3.5–5.1)
Sodium: 132 mmol/L — ABNORMAL LOW (ref 135–145)
TCO2: 22 mmol/L (ref 0–100)

## 2015-03-31 LAB — CBC
HCT: 46 % (ref 39.0–52.0)
Hemoglobin: 16.1 g/dL (ref 13.0–17.0)
MCH: 32.7 pg (ref 26.0–34.0)
MCHC: 35 g/dL (ref 30.0–36.0)
MCV: 93.5 fL (ref 78.0–100.0)
Platelets: 203 10*3/uL (ref 150–400)
RBC: 4.92 MIL/uL (ref 4.22–5.81)
RDW: 13.4 % (ref 11.5–15.5)
WBC: 9.5 10*3/uL (ref 4.0–10.5)

## 2015-03-31 LAB — PROTIME-INR
INR: 2.09 — ABNORMAL HIGH (ref 0.00–1.49)
Prothrombin Time: 23.3 seconds — ABNORMAL HIGH (ref 11.6–15.2)

## 2015-03-31 LAB — CARBOXYHEMOGLOBIN
CARBOXYHEMOGLOBIN: 0.8 % (ref 0.5–1.5)
METHEMOGLOBIN: 1.4 % (ref 0.0–1.5)
O2 Saturation: 60 %
TOTAL HEMOGLOBIN: 16.3 g/dL (ref 13.5–18.0)

## 2015-03-31 LAB — LACTIC ACID, PLASMA: Lactic Acid, Venous: 5 mmol/L (ref 0.5–2.0)

## 2015-03-31 MED ORDER — PHENYLEPHRINE HCL 10 MG/ML IJ SOLN
0.0000 ug/min | INTRAVENOUS | Status: DC
  Administered 2015-03-31: 200 ug/min via INTRAVENOUS
  Administered 2015-03-31: 150 ug/min via INTRAVENOUS
  Administered 2015-04-01 (×2): 200 ug/min via INTRAVENOUS
  Filled 2015-03-31 (×7): qty 4

## 2015-03-31 MED ORDER — METOCLOPRAMIDE HCL 5 MG/ML IJ SOLN
10.0000 mg | Freq: Four times a day (QID) | INTRAMUSCULAR | Status: DC
  Administered 2015-03-31 – 2015-04-01 (×3): 10 mg via INTRAVENOUS
  Filled 2015-03-31 (×8): qty 2

## 2015-03-31 MED ORDER — PANTOPRAZOLE SODIUM 40 MG PO PACK
40.0000 mg | PACK | Freq: Every day | ORAL | Status: DC
  Filled 2015-03-31: qty 20

## 2015-03-31 MED ORDER — CETYLPYRIDINIUM CHLORIDE 0.05 % MT LIQD
7.0000 mL | Freq: Four times a day (QID) | OROMUCOSAL | Status: DC
  Administered 2015-03-31 – 2015-04-01 (×2): 7 mL via OROMUCOSAL

## 2015-03-31 MED ORDER — LEVALBUTEROL HCL 0.63 MG/3ML IN NEBU: 0.6300 mg | INHALATION_SOLUTION | Freq: Four times a day (QID) | RESPIRATORY_TRACT | Status: DC

## 2015-03-31 MED ORDER — FENTANYL CITRATE (PF) 100 MCG/2ML IJ SOLN
50.0000 ug | INTRAMUSCULAR | Status: DC | PRN
  Administered 2015-03-31 – 2015-04-01 (×2): 50 ug via INTRAVENOUS
  Filled 2015-03-31 (×3): qty 2

## 2015-03-31 MED ORDER — DEXTROSE 50 % IV SOLN
INTRAVENOUS | Status: AC
  Administered 2015-03-31: 50 mL
  Filled 2015-03-31: qty 50

## 2015-03-31 MED ORDER — ALBUMIN HUMAN 25 % IV SOLN
25.0000 g | Freq: Once | INTRAVENOUS | Status: DC
  Filled 2015-03-31: qty 100

## 2015-03-31 MED ORDER — VANCOMYCIN HCL 10 G IV SOLR
2000.0000 mg | Freq: Once | INTRAVENOUS | Status: AC
  Administered 2015-03-31: 2000 mg via INTRAVENOUS
  Filled 2015-03-31: qty 2000

## 2015-03-31 MED ORDER — MIDAZOLAM HCL 2 MG/2ML IJ SOLN
2.0000 mg | Freq: Once | INTRAMUSCULAR | Status: AC
  Administered 2015-03-31: 2 mg via INTRAVENOUS

## 2015-03-31 MED ORDER — PANTOPRAZOLE SODIUM 40 MG IV SOLR
40.0000 mg | INTRAVENOUS | Status: DC
  Administered 2015-03-31: 40 mg via INTRAVENOUS
  Filled 2015-03-31 (×2): qty 40

## 2015-03-31 MED ORDER — ETOMIDATE 2 MG/ML IV SOLN
20.0000 mg | Freq: Once | INTRAVENOUS | Status: AC
  Administered 2015-03-31: 20 mg via INTRAVENOUS

## 2015-03-31 MED ORDER — BISACODYL 10 MG RE SUPP: 10.0000 mg | Freq: Every day | RECTAL | Status: DC | PRN

## 2015-03-31 MED ORDER — ASPIRIN 81 MG PO CHEW: 81.0000 mg | CHEWABLE_TABLET | Freq: Every day | ORAL | Status: DC

## 2015-03-31 MED ORDER — TRAVASOL 10 % IV SOLN: INTRAVENOUS | Status: DC

## 2015-03-31 MED ORDER — LEVALBUTEROL HCL 0.63 MG/3ML IN NEBU: 0.6300 mg | INHALATION_SOLUTION | RESPIRATORY_TRACT | Status: DC | PRN

## 2015-03-31 MED ORDER — VANCOMYCIN HCL 10 G IV SOLR
1250.0000 mg | INTRAVENOUS | Status: DC
  Filled 2015-03-31: qty 1250

## 2015-03-31 MED ORDER — MIDAZOLAM HCL 2 MG/2ML IJ SOLN
1.0000 mg | INTRAMUSCULAR | Status: DC | PRN
  Administered 2015-04-01 (×2): 1 mg via INTRAVENOUS
  Filled 2015-03-31 (×3): qty 2

## 2015-03-31 MED ORDER — MIDAZOLAM HCL 2 MG/2ML IJ SOLN
1.0000 mg | INTRAMUSCULAR | Status: DC | PRN
  Administered 2015-03-31: 1 mg via INTRAVENOUS
  Filled 2015-03-31 (×2): qty 2

## 2015-03-31 MED ORDER — CHLORHEXIDINE GLUCONATE 0.12 % MT SOLN
15.0000 mL | Freq: Two times a day (BID) | OROMUCOSAL | Status: DC
  Administered 2015-04-01 (×2): 15 mL via OROMUCOSAL
  Filled 2015-03-31 (×2): qty 15

## 2015-03-31 MED ORDER — SODIUM BICARBONATE 8.4 % IV SOLN
50.0000 meq | Freq: Once | INTRAVENOUS | Status: AC
  Administered 2015-03-31: 50 meq via INTRAVENOUS

## 2015-03-31 MED ORDER — VANCOMYCIN HCL IN DEXTROSE 750-5 MG/150ML-% IV SOLN
750.0000 mg | Freq: Two times a day (BID) | INTRAVENOUS | Status: DC
  Filled 2015-03-31: qty 150

## 2015-03-31 MED ORDER — NOREPINEPHRINE BITARTRATE 1 MG/ML IV SOLN
0.0000 ug/min | INTRAVENOUS | Status: DC
  Administered 2015-03-31 – 2015-04-01 (×2): 50 ug/min via INTRAVENOUS
  Filled 2015-03-31 (×4): qty 16

## 2015-03-31 MED ORDER — IPRATROPIUM-ALBUTEROL 0.5-2.5 (3) MG/3ML IN SOLN
RESPIRATORY_TRACT | Status: AC
  Administered 2015-03-31: 3 mL via RESPIRATORY_TRACT
  Filled 2015-03-31: qty 3

## 2015-03-31 MED ORDER — POTASSIUM CHLORIDE 10 MEQ/50ML IV SOLN
10.0000 meq | INTRAVENOUS | Status: AC
  Administered 2015-03-31 (×2): 10 meq via INTRAVENOUS
  Filled 2015-03-31 (×2): qty 50

## 2015-03-31 MED ORDER — METOPROLOL TARTRATE 25 MG/10 ML ORAL SUSPENSION
25.0000 mg | Freq: Two times a day (BID) | ORAL | Status: DC
  Filled 2015-03-31 (×3): qty 10

## 2015-03-31 MED ORDER — NOREPINEPHRINE BITARTRATE 1 MG/ML IV SOLN
0.0000 ug/min | INTRAVENOUS | Status: DC
  Administered 2015-03-31: 30 ug/min via INTRAVENOUS
  Filled 2015-03-31: qty 4

## 2015-03-31 MED ORDER — DOPAMINE-DEXTROSE 3.2-5 MG/ML-% IV SOLN
10.0000 ug/kg/min | INTRAVENOUS | Status: DC
  Administered 2015-03-31: 5 ug/kg/min via INTRAVENOUS
  Filled 2015-03-31 (×2): qty 250

## 2015-03-31 MED ORDER — MIDAZOLAM HCL 2 MG/2ML IJ SOLN
INTRAMUSCULAR | Status: AC
  Administered 2015-03-31: 2 mg
  Filled 2015-03-31: qty 4

## 2015-03-31 MED ORDER — SODIUM CHLORIDE 0.9 % IV BOLUS (SEPSIS)
1000.0000 mL | Freq: Once | INTRAVENOUS | Status: AC
  Administered 2015-03-31: 1000 mL via INTRAVENOUS

## 2015-03-31 MED ORDER — ACETAMINOPHEN 650 MG RE SUPP
650.0000 mg | RECTAL | Status: DC | PRN
  Administered 2015-03-31: 650 mg via RECTAL
  Filled 2015-03-31: qty 1

## 2015-03-31 MED ORDER — ASPIRIN EC 81 MG PO TBEC
81.0000 mg | DELAYED_RELEASE_TABLET | Freq: Every day | ORAL | Status: DC
Start: 2015-04-01 — End: 2015-04-01
  Filled 2015-03-31: qty 1

## 2015-03-31 MED ORDER — ALBUMIN HUMAN 5 % IV SOLN
INTRAVENOUS | Status: AC
  Administered 2015-03-31: 12.5 g
  Filled 2015-03-31: qty 250

## 2015-03-31 MED ORDER — "THROMBI-PAD 3""X3"" EX PADS"
1.0000 | MEDICATED_PAD | Freq: Once | CUTANEOUS | Status: DC
  Filled 2015-03-31: qty 1

## 2015-03-31 MED ORDER — SODIUM CHLORIDE 0.9 % IJ SOLN
10.0000 mL | INTRAMUSCULAR | Status: DC | PRN
  Administered 2015-03-31: 10 mL
  Administered 2015-04-01: 20 mL
  Filled 2015-03-31 (×2): qty 40

## 2015-03-31 MED ORDER — FUROSEMIDE 10 MG/ML IJ SOLN: 40.0000 mg | Freq: Every day | INTRAMUSCULAR | Status: DC

## 2015-03-31 MED ORDER — FENTANYL CITRATE (PF) 100 MCG/2ML IJ SOLN
INTRAMUSCULAR | Status: AC
  Administered 2015-03-31: 100 ug
  Filled 2015-03-31: qty 4

## 2015-03-31 MED ORDER — SODIUM CHLORIDE 0.9 % IV SOLN: INTRAVENOUS | Status: DC | PRN

## 2015-03-31 MED ORDER — METOPROLOL TARTRATE 12.5 MG HALF TABLET
12.5000 mg | ORAL_TABLET | Freq: Once | ORAL | Status: AC
  Administered 2015-03-31: 12.5 mg via ORAL

## 2015-03-31 MED ORDER — FUROSEMIDE 10 MG/ML IJ SOLN: 40.0000 mg | Freq: Three times a day (TID) | INTRAMUSCULAR | Status: DC

## 2015-03-31 MED ORDER — SODIUM CHLORIDE 0.9 % IJ SOLN
10.0000 mL | Freq: Two times a day (BID) | INTRAMUSCULAR | Status: DC
Start: 2015-03-31 — End: 2015-04-01
  Administered 2015-03-31: 40 mL
  Administered 2015-03-31 – 2015-04-01 (×2): 10 mL

## 2015-03-31 MED ORDER — ALBUMIN HUMAN 25 % IV SOLN
12.5000 g | Freq: Once | INTRAVENOUS | Status: AC
  Administered 2015-03-31: 12.5 g via INTRAVENOUS
  Filled 2015-03-31: qty 50

## 2015-03-31 MED ORDER — IPRATROPIUM-ALBUTEROL 0.5-2.5 (3) MG/3ML IN SOLN
3.0000 mL | Freq: Four times a day (QID) | RESPIRATORY_TRACT | Status: DC
  Administered 2015-03-31 – 2015-04-01 (×5): 3 mL via RESPIRATORY_TRACT
  Filled 2015-03-31 (×4): qty 3

## 2015-03-31 MED ORDER — AMIODARONE IV BOLUS ONLY 150 MG/100ML
150.0000 mg | Freq: Once | INTRAVENOUS | Status: AC
  Administered 2015-03-31: 150 mg via INTRAVENOUS

## 2015-03-31 MED ORDER — DEXTROSE 5 % IV SOLN
0.0000 ug/min | INTRAVENOUS | Status: DC
  Administered 2015-03-31: 20 ug/min via INTRAVENOUS
  Filled 2015-03-31: qty 1

## 2015-03-31 MED ORDER — VASOPRESSIN 20 UNIT/ML IV SOLN
0.0300 [IU]/min | INTRAVENOUS | Status: DC
  Administered 2015-04-01: 0.03 [IU]/min via INTRAVENOUS
  Filled 2015-03-31: qty 2

## 2015-03-31 MED ORDER — METOPROLOL TARTRATE 25 MG PO TABS
25.0000 mg | ORAL_TABLET | Freq: Two times a day (BID) | ORAL | Status: DC
  Filled 2015-03-31 (×3): qty 1

## 2015-03-31 MED ORDER — PIPERACILLIN-TAZOBACTAM 3.375 G IVPB
3.3750 g | Freq: Three times a day (TID) | INTRAVENOUS | Status: DC
  Administered 2015-03-31 – 2015-04-01 (×3): 3.375 g via INTRAVENOUS
  Filled 2015-03-31 (×5): qty 50

## 2015-03-31 MED ORDER — WARFARIN SODIUM 2.5 MG PO TABS
2.5000 mg | ORAL_TABLET | Freq: Every day | ORAL | Status: DC
  Filled 2015-03-31: qty 1

## 2015-03-31 MED ORDER — FENTANYL CITRATE (PF) 100 MCG/2ML IJ SOLN
50.0000 ug | INTRAMUSCULAR | Status: DC | PRN
  Administered 2015-03-31: 50 ug via INTRAVENOUS
  Filled 2015-03-31 (×2): qty 2

## 2015-03-31 MED ORDER — INSULIN ASPART 100 UNIT/ML ~~LOC~~ SOLN: 0.0000 [IU] | SUBCUTANEOUS | Status: DC

## 2015-03-31 NOTE — Progress Notes (Signed)
      [address]       ,[place] [address]             [phone]      Intubated  BP 88/56 mmHg  Pulse 29  Temp(Src) 98.3 F (36.8 C) (Oral)  Resp 32  Ht 5\' 11"  (1.803 m)  Wt 244 lb 0.8 oz (110.7 kg)  BMI 34.05 kg/m2  SpO2 100%   Intake/Output Summary (Last 24 hours) at 03/31/15 1840 Last data filed at 03/31/15 1728  Gross per 24 hour  Intake 1326.5 ml  Output   1795 ml  Net -468.5 ml  Also had ~ 3L out of NG  Abdomen still distended  CT abdomen pending  His creatinine went from 1.0- 1.7- start dopamine at 5 mcg/kg/min  Remo Lipps C. Roxan Hockey, MD [facility] [phone]

## 2015-03-31 NOTE — Progress Notes (Signed)
Upon assessment of patient by MD, orders received to place NG tube for suction and intubation of patient for worsening Respiratory effort as pt is unable to tolerate BiPap d/t vomiting. 2600 mL of brown bilious fluid removed from the stomach. Patient prepared for intubation using RSI protocol.

## 2015-03-31 NOTE — Consult Note (Signed)
PULMONARY / CRITICAL CARE MEDICINE   Name: Chris Reyes MRN: [id number] DOB: Apr 20, 1951    ADMISSION DATE:  03/28/2015 CONSULTATION DATE:  03/31/2015  REFERRING MD :  Roxan Hockey  CHIEF COMPLAINT:  Respiratory distress, tachypnea post op  INITIAL PRESENTATION: 64yo M 4 days post op (7/25) aortic valve replacement and CABG who developed rapid afib, progressive weakness, dizziness, nausea. CXR showed LLL infiltrate with concern for PNA. On 7/29 patient was having tachypnea that worsened throughout the day and PCCM was consulted. Patient was intubated for acute respiratory failure.   STUDIES:  7/29 CT abd/pelvis >> 7/29 CT chest >>  SIGNIFICANT EVENTS: 7/25 CABG and AV replacement 7/29 Intubated for respiratory distress   HISTORY OF PRESENT ILLNESS:  64yo M with history of severe AS, CAD with HF EF 45%, RBBB, COPD, PE in 2007 who had symptomatic AS and sought care. Imaging showed severe AS with focal wall abnormalities, and right and left heart cath indicated need for a CABG. Patient had a planned aortic valve replacement and CABG on 7/25.   Since his surgery, he developed afib, had vertigo and weakness but had no respiratory complaints. The nurse says he had not had any trouble breathing before this morning but noted that he was tachypneic to the 30's this am, that worsened to the 40's after a PICC placement afternoon of the 29th. He then became diaphoretic, was not tolerating the Bipap, and PCCM was consulted.  Other note: he has had one BM (morning of the 29th) since surgery per nurse's report. She said he had complained of some low-mid abdominal pain but no workup was sought at that time.  PCCM was consulted 7/29 and noted patient in significant respiratory distress with a distended abdomen and possible nonreducible umbilical hernia. 2L of bilious fluid was drained per NG, then patient was intubated. PCCM to continue to follow.   PAST MEDICAL HISTORY :   has a past medical history  of Hypertension; Hernia; COPD (chronic obstructive pulmonary disease); CAD (coronary artery disease); PAD (peripheral artery disease); Ejection fraction; Aortic stenosis; Pulmonary emboli (2007); Warfarin anticoagulation; Tobacco abuse; Carotid artery disease; Dyslipidemia; Heart murmur; History of hiatal hernia; GERD (gastroesophageal reflux disease); Chronic lower back pain; RBBB; Shortness of breath dyspnea; History of pneumonia; gallstones; Sinus headache; and Migraine.  has past surgical history that includes Cholecystectomy (1998); Tonsillectomy; Carpal tunnel release (Left, ~ 1994); Lumbar disc surgery (1988; 1990); Posterior lumbar fusion (2006); Cardiac catheterization (N/A, 02/14/2015); Back surgery (1988, 1990); Aortic valve replacement (N/A, 03/22/2015); Coronary artery bypass graft (N/A, 03/19/2015); and TEE without cardioversion (N/A, 03/11/2015). Prior to Admission medications   Medication Sig Start Date End Date Taking? Authorizing Provider  aspirin EC 81 MG EC tablet Take 1 tablet (81 mg total) by mouth daily. 04/13/14  Yes Geradine Girt, DO  Coenzyme Q10 (COQ10) 100 MG CAPS Take 1 capsule by mouth daily.    Yes Historical Provider, MD  fish oil-omega-3 fatty acids 1000 MG capsule Take 1 g by mouth daily.   Yes Historical Provider, MD  fluticasone (FLONASE) 50 MCG/ACT nasal spray Place 1 spray into both nostrils daily. 08/30/13  Yes Rowe Clack, MD  isosorbide mononitrate (IMDUR) 30 MG 24 hr tablet Take 30 mg by mouth daily.   Yes Historical Provider, MD  losartan (COZAAR) 50 MG tablet TAKE 1 TABLET (50 MG TOTAL) BY MOUTH DAILY. 02/22/15  Yes Olga Millers, MD  ibuprofen (ADVIL,MOTRIN) 200 MG tablet Take 600 mg by mouth every 6 (six) hours as  needed (pain).    Historical Provider, MD  warfarin (COUMADIN) 5 MG tablet TAKE AS DIRECTED BY ANTICOAGULATION CLINIC Patient not taking: Reported on 03/22/2015 02/20/15   Rowe Clack, MD   No Known Allergies  FAMILY HISTORY:   indicated that his mother is deceased. He indicated that his father is deceased.  SOCIAL HISTORY:  reports that he quit smoking about 8 months ago. His smoking use included Cigarettes. He has a 10 pack-year smoking history. He has never used smokeless tobacco. He reports that he drinks alcohol. He reports that he does not use illicit drugs.  REVIEW OF SYSTEMS:  Unable to obtain as patient is sedated and intubated  SUBJECTIVE: Patient says he is having trouble breathing. Does not report having pain.  VITAL SIGNS: Temp:  [97.2 F (36.2 C)-99.3 F (37.4 C)] 97.3 F (36.3 C) (07/29 1224) Pulse Rate:  [37-126] 126 (07/29 1100) Resp:  [0-41] 33 (07/29 1100) BP: (97-172)/(61-109) 103/77 mmHg (07/29 1100) SpO2:  [86 %-100 %] 97 % (07/29 1100) Weight:  [110.7 kg (244 lb 0.8 oz)] 110.7 kg (244 lb 0.8 oz) (07/29 0600) HEMODYNAMICS:   VENTILATOR SETTINGS:   INTAKE / OUTPUT:  Intake/Output Summary (Last 24 hours) at 03/31/15 1435 Last data filed at 03/31/15 1100  Gross per 24 hour  Intake  880.4 ml  Output   1770 ml  Net -889.6 ml    PHYSICAL EXAMINATION: General:  Diaphoretic obese male, in obvious respiratory distress, laying in bed uncomfortably Neuro:  Alert and oriented HEENT:  PERRLA, no dentures in Cardiovascular:  Irregular rate and rhythm, tachycardic, unable to appreciate m/r/g Lungs:  Coarse breath sounds bilaterally. Respiratory muscle use, breathing 40-50x/min Abdomen: Distended, umbilical hernia noted that iunclear if reducible, not red or warm Musculoskeletal:  No edema Skin:  Mottling noted on the abdomen and legs  LABS:  CBC  Recent Labs Lab 03/29/15 0337 03/29/15 1714 03/30/15 0420 03/31/15 0215  WBC 20.9*  --  15.3* 9.5  HGB 13.7 15.0 15.0 16.1  HCT 39.0 44.0 43.0 46.0  PLT 92*  --  124* 203   Coag's  Recent Labs Lab 03/22/2015 1459 03/29/15 0337 03/30/15 0420 03/31/15 0215  APTT 31  --   --   --   INR 1.47 1.39 1.52* 2.09*   BMET  Recent  Labs Lab 03/30/15 0420 03/30/15 1846 03/31/15 0215  NA 132* 130* 132*  K 4.0 3.7 3.9  CL 96* 94* 95*  CO2 29 25 26   BUN 22* 31* 36*  CREATININE 0.93 1.04 1.06  GLUCOSE 130* 142* 135*   Electrolytes  Recent Labs Lab 03/28/15 0430 03/28/15 1615  03/30/15 0420 03/30/15 1846 03/31/15 0215  CALCIUM 7.8*  --   < > 8.4* 8.4* 8.8*  MG 2.0 1.9  --  2.4  --   --   < > = values in this interval not displayed. Sepsis Markers No results for input(s): LATICACIDVEN, PROCALCITON, O2SATVEN in the last 168 hours. ABG  Recent Labs Lab 03/17/2015 1950 03/07/2015 2125 03/28/15 0404  PHART 7.386 7.346* 7.392  PCO2ART 39.3 40.9 38.6  PO2ART 76.0* 74.0* 70.0*   Liver Enzymes  Recent Labs Lab 03/30/15 0420 03/31/15 0215  AST 32 26  ALT 19 20  ALKPHOS 42 41  BILITOT 1.5* 1.4*  ALBUMIN 2.6* 2.6*   Cardiac Enzymes No results for input(s): TROPONINI, PROBNP in the last 168 hours. Glucose  Recent Labs Lab 03/30/15 0900 03/30/15 1238 03/30/15 1618 03/30/15 2151 03/31/15 0832 03/31/15 1222  GLUCAP  145* 152* 151* 115* 125* 127*    Imaging Dg Chest Port 1 View  03/31/2015   CLINICAL DATA:  Status post central line placement.  EXAM: PORTABLE CHEST - 1 VIEW  COMPARISON:  Same day.  FINDINGS: Stable cardiomegaly. Status post coronary artery bypass graft. No pneumothorax is noted. Right lung is clear. Elevated left hemidiaphragm is noted. Slightly increased left perihilar and basilar opacities are noted concerning for atelectasis or pneumonia with associated pleural effusion. Interval placement of right-sided PICC line with distal tip in the expected position of the right atrium.  IMPRESSION: Slightly increased left perihilar and basilar opacities are noted concerning for atelectasis or pneumonia with associated pleural effusion.  Interval placement of right-sided PICC line with distal tip in expected position of the right atrium. Withdrawal by 3-4 cm is recommended. These results will be  called to the ordering clinician or representative by the Radiologist Assistant, and communication documented in the PACS or zVision Dashboard.   Electronically Signed   By: Marijo Conception, M.D.   On: 03/31/2015 13:35   Dg Chest Port 1 View  03/31/2015   CLINICAL DATA:  Status post aortic valve replacement.  EXAM: PORTABLE CHEST - 1 VIEW  COMPARISON:  March 30, 2015.  FINDINGS: Stable cardiomegaly. Status post coronary artery bypass graft. No pneumothorax is noted. Elevated left hemidiaphragm is again noted. Left lower lobe opacity is noted concerning for pneumonia or subsegmental atelectasis.  IMPRESSION: Grossly stable left lower lobe opacity is noted concerning for pneumonia or subsegmental atelectasis.   Electronically Signed   By: Marijo Conception, M.D.   On: 03/31/2015 07:57     ASSESSMENT / PLAN:  PULMONARY OETT 7/28 >> A: Acute respiratory distress with tachypnea Aspiration pneumonia likely Pulmonary edema At risk ARDS P:   Mechanical ventilation necessary Chest CT without contrast, assess edema / infiltrates as going down for CT abdo ABg now on 8 cc/kg, rate 16, may need peep elevation, evaluate plats, keep less 30 Scheduled duonebs Lasix, see renal, to neg balance goals 1 liter decompress stomach pcxr now abg Get cvp  CARDIOVASCULAR PICC line R arm 7/29 >> A:  Post op CABG/AV replacement 7/25 Hypotension Afib with RVR - post op complication P:  Neo for goal SBP >90, or MAP >60 On Amiodarone drip getting boluses per cards On warfarin - INR 2.09 On Lopressor 25 BID , may need to hold Lasix Get lactic  RENAL A:   Fluid overloaded P:   Lasix 40 IV q8 hrs K supplement Foley in place Monitor I&Os  Follow BMP, replace electrolytes PRN  GASTROINTESTINAL A:   OG tube 5/09 >> Umbilical hernia r/o incarceration Ileus possible P:   NG tube drained 2L bilious fluid Switch to OG tube today to reduce vap Getting CT abd/pevis to r/o ileus vs. obstruction vs.  Incarcerated hernia] Monitor BM - dulcolax and colace as needed , may need more aggressive means, after CT PPI  HEMATOLOGIC A:   DVt prevention P:  Continue monitoring CBC  Coumadin for now  INFECTIOUS A:   Left lower lobe PNA, aspiration likely source (nosoocmial) R/o incarceration P:   Sputum culture 7/29 >>  Abx:  Cefuroxime start 7/26 >> 7/27 Ceftazidime 7/28 >> 7/29 Zosyn start 7/29, day 1/x Vanc start 7/29, day 1/x  CT chest addition CT abdo   ENDOCRINE A:   Hyperglycemia - well controlled P:   Continue SSI Continue Levemir  If remains intubated, discuss decreasing insulin vs. adding TF  NEUROLOGIC  A:   Sedated with intubation / dyschrony P:   RASS goal: 0 Consider attempt precedex Limit fent as able until r/o ileus  FAMILY  - Updates: family not present  - Inter-disciplinary family meet or Palliative Care meeting due by:  8/5  Ccm time 58 min   Lavon Paganini. Titus Mould, MD, [place] Pgr: [facility] 03/31/2015 4:45 PM  Pulmonary and [place] Pager: [phone]  03/31/2015, 2:35 PM

## 2015-03-31 NOTE — Progress Notes (Signed)
Physical Therapy Treatment Patient Details Name: Chris Reyes MRN: [id number] DOB: March 03, 1951 Today's Date: 03/31/2015    History of Present Illness Adm 7/25 for AVR and CABG. POD 1 ambulated 150 ft with nursing. POD 2 developed vertigo. PMHx-h/o BPPV twice before (last in Nov 2015); COPD; PE on chronic anticoag; PAD/CAD; back surgery x 3    PT Comments    Limited vertigo assessment continued today without much luck to narrow down treatment or diagnosis.  We were limited by his increased HR and DOE with any mobility and activity intolerance.  His symptoms do seem to be pretty constant when moving and standing.  He is symptom free seated with back support and supine in the bed.  His symptom duration >1 min make me lean more towards a hypofunction or central origin vs true BPPV as BPPV symptoms are usually shorter duration.  He does report ringing and fullness in the ears and has had episodes of vertigo in the past.  This could be undiagnosed Menier's Disease.  No difficulty hearing with gross testing.  PT will continue to follow acutely to try to continue vestibular assessment and treatment as pt is able to tolerate (cardiopulmonary status).    Follow Up Recommendations  CIR;Supervision/Assistance - 24 hour     Equipment Recommendations  Rolling walker with 5" wheels    Recommendations for Other Services Rehab consult;OT consult     Precautions / Restrictions Precautions Precautions: Sternal;Fall Precaution Comments: verbally reviewed sternal precautions during mobility.     Mobility  Bed Mobility Overal bed mobility: Needs Assistance Bed Mobility: Sit to Supine       Sit to supine: +2 for physical assistance;Max assist   General bed mobility comments: Two person max assist to support trunk and legs to get back to supine.  Pt in a hurry to do so because of increased DOE, so unable to attempt sit -> side -> supine.   Transfers Overall transfer level: Needs assistance Equipment  used: 2 person hand held assist Transfers: Sit to/from [facility] Sit to Stand: +2 physical assistance;Mod assist Stand pivot transfers: +2 safety/equipment;Max assist       General transfer comment: Two person mod assist to support trunk to get to standing two person max assist to attempt 2-3 pivotal steps to bed.  Pt sitting prematurely.  Pt very unsteady with increased DOE with very limited mobility.   Ambulation/Gait             General Gait Details: unable to attempt at this time due to DOE, increased HR and imbalance in standing.          Balance Overall balance assessment: Needs assistance Sitting-balance support: Feet supported;No upper extremity supported Sitting balance-Leahy Scale: Poor Sitting balance - Comments: Mod assist to maintain balance momentarily EOB.    Standing balance support: Bilateral upper extremity supported Standing balance-Leahy Scale: Zero Standing balance comment: Poor to zero.  Right lateral lean with very flexed trunk.  Verbal cues for upright posture.                     Cognition Arousal/Alertness: Awake/alert Behavior During Therapy: WFL for tasks assessed/performed Overall Cognitive Status: Within Functional Limits for tasks assessed                         General Comments General comments (skin integrity, edema, etc.): Attempted vestibular assessment seated in recliner chair. See vestibular assessment attached.  Pertinent Vitals/Pain Pain Assessment: Faces Faces Pain Scale: Hurts even more Pain Location: left groin per pt report.  Pain Descriptors / Indicators: Guarding;Grimacing Pain Intervention(s): Limited activity within patient's tolerance;Monitored during session;Repositioned           PT Goals (current goals can now be found in the care plan section) Acute Rehab PT Goals Patient Stated Goal: To get moving, resolve dizziness Progress towards PT goals: Progressing toward  goals    Frequency  Min 3X/week    PT Plan Current plan remains appropriate       End of Session Equipment Utilized During Treatment: Oxygen Activity Tolerance: Treatment limited secondary to medical complications (Comment) (limited by HR 124 and DOE 3-4/4) Patient left: in bed;with call bell/phone within reach;with nursing/sitter in room     Time: 0940-1006 PT Time Calculation (min) (ACUTE ONLY): 26 min  Charges:  $Therapeutic Activity: 23-37 mins                    Rutherford Alarie B. Heather Mckendree, PT, DPT [phone]    Barbarann Ehlers Helton Oleson, PT, DPT [phone]   03/31/2015, 12:56 PM

## 2015-03-31 NOTE — Progress Notes (Signed)
4 Days Post-Op Procedure(s) (LRB): AORTIC VALVE REPLACEMENT (AVR) (N/A) CORONARY ARTERY BYPASS GRAFTING (CABG), ON PUMP, TIMES THREE, USING LEFT INTERNAL MAMMARY ARTERY, RIGHT GREATER SAPHENOUS VEIN HARVESTED ENDOSCOPICALLY (N/A) TRANSESOPHAGEAL ECHOCARDIOGRAM (TEE) (N/A) Subjective: Feels a little better today Still short of breath Nausea and hiccups better  Objective: Vital signs in last 24 hours: Temp:  [97.2 F (36.2 C)-99.3 F (37.4 C)] 97.2 F (36.2 C) (07/29 0834) Pulse Rate:  [37-125] 125 (07/29 0934) Cardiac Rhythm:  [-] Atrial fibrillation (07/29 0800) Resp:  [0-41] 35 (07/29 0800) BP: (97-156)/(61-109) 119/95 mmHg (07/29 0934) SpO2:  [86 %-100 %] 97 % (07/29 0800) Weight:  [244 lb 0.8 oz (110.7 kg)] 244 lb 0.8 oz (110.7 kg) (07/29 0600)  Hemodynamic parameters for last 24 hours:    Intake/Output from previous day: 07/28 0701 - 07/29 0700 In: 1247.4 [P.O.:480; I.V.:567.4; IV Piggyback:200] Out: 2475 [Urine:2000; Emesis/NG output:475] Intake/Output this shift: Total I/O In: -  Out: 170 [Urine:170]  General appearance: alert and mild distress Neurologic: intact Heart: irregularly irregular rhythm Lungs: using accessory muscles, rhonchi bilaterally Abdomen: distended, nontender  Lab Results:  Recent Labs  03/30/15 0420 03/31/15 0215  WBC 15.3* 9.5  HGB 15.0 16.1  HCT 43.0 46.0  PLT 124* 203   BMET:  Recent Labs  03/30/15 1846 03/31/15 0215  NA 130* 132*  K 3.7 3.9  CL 94* 95*  CO2 25 26  GLUCOSE 142* 135*  BUN 31* 36*  CREATININE 1.04 1.06  CALCIUM 8.4* 8.8*    PT/INR:  Recent Labs  03/31/15 0215  LABPROT 23.3*  INR 2.09*   ABG    Component Value Date/Time   PHART 7.392 03/28/2015 0404   HCO3 23.3 03/28/2015 0404   TCO2 26 03/29/2015 1714   ACIDBASEDEF 1.0 03/28/2015 0404   O2SAT 93.0 03/28/2015 0404   CBG (last 3)   Recent Labs  03/30/15 1618 03/30/15 2151 03/31/15 0832  GLUCAP 151* 115* 125*    Assessment/Plan: S/P  Procedure(s) (LRB): AORTIC VALVE REPLACEMENT (AVR) (N/A) CORONARY ARTERY BYPASS GRAFTING (CABG), ON PUMP, TIMES THREE, USING LEFT INTERNAL MAMMARY ARTERY, RIGHT GREATER SAPHENOUS VEIN HARVESTED ENDOSCOPICALLY (N/A) TRANSESOPHAGEAL ECHOCARDIOGRAM (TEE) (N/A) -  CV- still in rapid a fib- rebolus with amiodarone  Increase metoprolol  INR up to 2.09- decrease coumadin to 2.5 mg daily  RESP_ still congested and rhonchi bilaterally  IS, nebs  Day 2 ceftazidime for presumed pneumonia  RENAL- creatinine stable, continue diuresis  GI - hiccups and nausea improved  ENDO- CBG well controlled  Needs to remain in ICU secondary to respiratory status   LOS: 4 days    Melrose Nakayama 03/31/2015

## 2015-03-31 NOTE — Procedures (Signed)
  Intubation Procedure Note KAZMIR OKI [id number] 07-11-1951  Procedure: Intubation Indications: Respiratory insufficiency  Procedure Details Consent: Risks of procedure as well as the alternatives and risks of each were explained to the (patient/caregiver).  Consent for procedure obtained. Time Out: Verified patient identification, verified procedure, site/side was marked, verified correct patient position, special equipment/implants available, medications/allergies/relevent history reviewed, required imaging and test results available.  Performed  MAC and 3   Evaluation Hemodynamic Status: Transient hypotension resolved spontaneously; O2 sats: stable throughout Patient's Current Condition: stable Complications: No apparent complications Patient did tolerate procedure well. Chest X-ray ordered to verify placement.  CXR: tube position acceptable.  Note: Patient had NG tube placed and greater than 2L of fluid was drained before intubation began. Procedure was postponed until the NG suction flow had slowed to an acceptable rate to where aspiration risk seemed lowest.   Raylene Miyamoto. 03/31/2015  Glide Tolerated well  Lavon Paganini. Titus Mould, MD, [place] Pgr: [facility]

## 2015-03-31 NOTE — Progress Notes (Signed)
Arterial Line attempted unsuccessfully by 3 RT's. RN and MD aware.

## 2015-03-31 NOTE — Progress Notes (Signed)
ANTIBIOTIC CONSULT NOTE - INITIAL  Pharmacy Consult for vancomycin/zosyn Indication: r/o sepsis  No Known Allergies  Patient Measurements: Height: 5\' 11"  (180.3 cm) Weight: 244 lb 0.8 oz (110.7 kg) IBW/kg (Calculated) : 75.3  Vital Signs: Temp: 97.3 F (36.3 C) (07/29 1224) Temp Source: Oral (07/29 1224) BP: 103/77 mmHg (07/29 1100) Pulse Rate: 126 (07/29 1100) Intake/Output from previous day: 07/28 0701 - 07/29 0700 In: 1247.4 [P.O.:480; I.V.:567.4; IV Piggyback:200] Out: 2475 [Urine:2000; Emesis/NG output:475] Intake/Output from this shift: Total I/O In: 152 [I.V.:152] Out: 620 [Urine:620]  Labs:  Recent Labs  03/29/15 0337 03/29/15 1714 03/30/15 0420 03/30/15 1846 03/31/15 0215  WBC 20.9*  --  15.3*  --  9.5  HGB 13.7 15.0 15.0  --  16.1  PLT 92*  --  124*  --  203  CREATININE 0.88 0.90 0.93 1.04 1.06   Estimated Creatinine Clearance: 88 mL/min (by C-G formula based on Cr of 1.06). No results for input(s): VANCOTROUGH, VANCOPEAK, VANCORANDOM, GENTTROUGH, GENTPEAK, GENTRANDOM, TOBRATROUGH, TOBRAPEAK, TOBRARND, AMIKACINPEAK, AMIKACINTROU, AMIKACIN in the last 72 hours.   Microbiology: Recent Results (from the past 720 hour(s))  Surgical pcr screen     Status: None   Collection Time: 03/23/15  1:53 PM  Result Value Ref Range Status   MRSA, PCR NEGATIVE NEGATIVE Final   Staphylococcus aureus NEGATIVE NEGATIVE Final    Comment:        The Xpert SA Assay (FDA approved for NASAL specimens in patients over 20 years of age), is one component of a comprehensive surveillance program.  Test performance has been validated by [facility] for patients greater than or equal to 48 year old. It is not intended to diagnose infection nor to guide or monitor treatment.    Assessment: 62 YOM s/p bioprosthetic AVR, completed post-op vanc/zinacef and started on fortaz yesterday for possible pneumonia. Now with increasing WOB, CXR - worsening air space disease in left  lung. Pharmacy is consulted to start vancomycin and zosyn to cover pneumonia/aspiration pneumonia. Pt is afebrile, wbc wnl. Scr 1.06, est. crcl ~ 85 ml/min.  Tressie Ellis 7/28 >>7/29 Vancomycin 7/29 >> Zosyn 7/29 >>  Vanc/zinacef for surgical px 7/25 >> 7/27  Goal of Therapy:  Vancomycin trough level 15-20 mcg/ml  Plan:  - Vancomycin 2g loading dose then 750mg  IV Q 12 hrs - Zosyn 3.375g IV Q 8 (4 hr infusion) - Monitor renal function and clinical improvement - Vancomycin trough at steady state.  [name], PharmD, BCPS  Clinical Pharmacist  Pager: [phone]   03/31/2015,2:45 PM

## 2015-03-31 NOTE — Progress Notes (Signed)
RT obtained and sent sputum sample to lab.

## 2015-03-31 NOTE — Progress Notes (Signed)
      [address]       [place] [address]             [phone]      CTSP for worsening respiratory status.  Chris Reyes became progressively more tachypnic 40-50 and had increased work of breathing this afternoon.  He c/o feeling short of breath.  When attempting to NT suction, RN noted emesis. He had a PICC line placed earlier and the CXR was just done. It shows worsening AS disease in the left lung.  On exam he is awake, diaphoretic and using accessory muscles of breathing, obvious distress  BP 103/77 mmHg  Pulse 126  Temp(Src) 97.3 F (36.3 C) (Oral)  Resp 33  Ht 5\' 11"  (1.803 m)  Wt 244 lb 0.8 oz (110.7 kg)  BMI 34.05 kg/m2  SpO2 97%  Rhonchi and wheezing bilaterally L > R Abdomen more distended, tympanitic and mildly tender, no peritoneal signs. Umbilical hernia reducible.  An NG was placed and 2.5 L of bilious fluid evacuated.  Consulted Dr. Titus Mould from Pulmonary CCM  He was intubated for acute on chronic respiratory failure.   Probable aspiration pneumonitis/ pneumonia  Will broaden antibiotics and plan a CT abdomen later today  Steven C. Roxan Hockey, MD [facility] [phone]

## 2015-03-31 NOTE — Progress Notes (Signed)
eLink Physician-Brief Progress Note Patient Name: Chris Reyes DOB: Nov 20, 1950 MRN: [id number]   Date of Service  03/31/2015  HPI/Events of Note  Lactic Acid level = 4.01. Abdominal CT Scan pending.   eICU Interventions  Will bolus with 0.9 NaCl 1 liter IV over 1 hour now.     Intervention Category Major Interventions: Acid-Base disturbance - evaluation and management  Kiari Hosmer Eugene 03/31/2015, 8:14 PM

## 2015-03-31 NOTE — Progress Notes (Signed)
ABG obtained with minimal sample, however, when ran on istat values had asterisks therefor it would not cross over.

## 2015-03-31 NOTE — Progress Notes (Signed)
eLink Physician-Brief Progress Note Patient Name: Chris Reyes DOB: Oct 30, 1950 MRN: [id number]   Date of Service  03/31/2015  HPI/Events of Note  Continued hypotension despite NE/NEO/DA gtts.  Current BP of 89/55 (65).  Nurse states that goal MAP is 70 per CVTS.    eICU Interventions  Plan: Add VASO gtt for BP support Nurse to touch base with CVTS to verify MAP goal.     Intervention Category Major Interventions: Hypotension - evaluation and management  Jamail Cullers 03/31/2015, 11:49 PM

## 2015-03-31 NOTE — Progress Notes (Signed)
Dr. Prescott Gum ordered TPN--A TPN per pharmacy consult has been entered.  As it is past 1200, TPN cannot be made for administration tonight.  The TPN pharmacist will follow up in the morning and enter orders to start TPN on 7/30.  Full TPN labs have been ordered.   Naava Janeway D. Lesly Joslyn, PharmD, BCPS Clinical Pharmacist 03/31/2015 4:42 PM

## 2015-03-31 NOTE — Progress Notes (Signed)
eLink Physician-Brief Progress Note Patient Name: Chris Reyes DOB: [birth year] MRN: [id number]   Date of Service  03/31/2015  HPI/Events of Note  Hypotension. BP = 85/48. Currently on Phenylephrine IV infusion at 80 mcg/min. LVEF 55% to 60% in 01/2015.  eICU Interventions  Will bolus with 0.9 NaCl 1 lither IV over 1 hour now. Nurse instructed to titrate Phenylephrine IV infusion up as well.      Intervention Category Major Interventions: Hypotension - evaluation and management  Sommer,Steven Eugene 03/31/2015, 6:04 PM

## 2015-03-31 NOTE — Progress Notes (Signed)
MD paged concerning pt's increasing RR, diaphoresis, and inability to clear secretions. MD to see pt at bedside. NTS unsuccessful as pt vomited during procedure. BP and O2 sats stable however. Hr remains in A Fib with a rate 100s-120s. Orders received to obtain ABG and trial Bipap.

## 2015-04-01 ENCOUNTER — Inpatient Hospital Stay (HOSPITAL_COMMUNITY): Payer: Medicare Other | Admitting: Anesthesiology

## 2015-04-01 ENCOUNTER — Encounter (HOSPITAL_COMMUNITY): Payer: Self-pay | Admitting: Internal Medicine

## 2015-04-01 ENCOUNTER — Inpatient Hospital Stay (HOSPITAL_COMMUNITY): Payer: Medicare Other

## 2015-04-01 ENCOUNTER — Encounter (HOSPITAL_COMMUNITY): Admission: RE | Disposition: E | Payer: Self-pay | Source: Ambulatory Visit | Attending: Cardiothoracic Surgery

## 2015-04-01 DIAGNOSIS — R6521 Severe sepsis with septic shock: Secondary | ICD-10-CM

## 2015-04-01 DIAGNOSIS — A419 Sepsis, unspecified organism: Secondary | ICD-10-CM

## 2015-04-01 DIAGNOSIS — J9601 Acute respiratory failure with hypoxia: Secondary | ICD-10-CM

## 2015-04-01 DIAGNOSIS — N179 Acute kidney failure, unspecified: Secondary | ICD-10-CM

## 2015-04-01 DIAGNOSIS — J189 Pneumonia, unspecified organism: Secondary | ICD-10-CM

## 2015-04-01 DIAGNOSIS — K631 Perforation of intestine (nontraumatic): Secondary | ICD-10-CM

## 2015-04-01 DIAGNOSIS — R579 Shock, unspecified: Secondary | ICD-10-CM | POA: Insufficient documentation

## 2015-04-01 HISTORY — PX: CENTRAL LINE: PRO85

## 2015-04-01 HISTORY — PX: LAPAROTOMY: SHX154

## 2015-04-01 LAB — POCT I-STAT, CHEM 8
BUN: 54 mg/dL — ABNORMAL HIGH (ref 6–20)
Calcium, Ion: 0.97 mmol/L — ABNORMAL LOW (ref 1.13–1.30)
Chloride: 95 mmol/L — ABNORMAL LOW (ref 101–111)
Creatinine, Ser: 2.4 mg/dL — ABNORMAL HIGH (ref 0.61–1.24)
Glucose, Bld: 193 mg/dL — ABNORMAL HIGH (ref 65–99)
HCT: 41 % (ref 39.0–52.0)
Hemoglobin: 13.9 g/dL (ref 13.0–17.0)
Potassium: 3.3 mmol/L — ABNORMAL LOW (ref 3.5–5.1)
Sodium: 135 mmol/L (ref 135–145)
TCO2: 19 mmol/L (ref 0–100)

## 2015-04-01 LAB — DIFFERENTIAL
Basophils Absolute: 0.1 10*3/uL (ref 0.0–0.1)
Basophils Relative: 1 % (ref 0–1)
Eosinophils Absolute: 0 10*3/uL (ref 0.0–0.7)
Eosinophils Relative: 0 % (ref 0–5)
Lymphocytes Relative: 14 % (ref 12–46)
Lymphs Abs: 1.2 10*3/uL (ref 0.7–4.0)
Monocytes Absolute: 0.3 10*3/uL (ref 0.1–1.0)
Monocytes Relative: 4 % (ref 3–12)
Neutro Abs: 6.7 10*3/uL (ref 1.7–7.7)
Neutrophils Relative %: 81 % — ABNORMAL HIGH (ref 43–77)
WBC Morphology: INCREASED

## 2015-04-01 LAB — COMPREHENSIVE METABOLIC PANEL
ALK PHOS: 36 U/L — AB (ref 38–126)
ALK PHOS: 35 U/L — AB (ref 38–126)
ALT: 74 U/L — ABNORMAL HIGH (ref 17–63)
ALT: 24 U/L (ref 17–63)
AST: 134 U/L — ABNORMAL HIGH (ref 15–41)
AST: 49 U/L — AB (ref 15–41)
Albumin: 1.7 g/dL — ABNORMAL LOW (ref 3.5–5.0)
Albumin: 1.8 g/dL — ABNORMAL LOW (ref 3.5–5.0)
Anion gap: 16 — ABNORMAL HIGH (ref 5–15)
Anion gap: 21 — ABNORMAL HIGH (ref 5–15)
BILIRUBIN TOTAL: 1.8 mg/dL — AB (ref 0.3–1.2)
BUN: 62 mg/dL — ABNORMAL HIGH (ref 6–20)
BUN: 59 mg/dL — ABNORMAL HIGH (ref 6–20)
CALCIUM: 6.6 mg/dL — AB (ref 8.9–10.3)
CO2: 17 mmol/L — AB (ref 22–32)
CO2: 22 mmol/L (ref 22–32)
Calcium: 7 mg/dL — ABNORMAL LOW (ref 8.9–10.3)
Chloride: 98 mmol/L — ABNORMAL LOW (ref 101–111)
Chloride: 100 mmol/L — ABNORMAL LOW (ref 101–111)
Creatinine, Ser: 2.42 mg/dL — ABNORMAL HIGH (ref 0.61–1.24)
Creatinine, Ser: 2.78 mg/dL — ABNORMAL HIGH (ref 0.61–1.24)
GFR calc Af Amer: 31 mL/min — ABNORMAL LOW (ref >60)
GFR calc Af Amer: 26 mL/min — ABNORMAL LOW (ref >60)
GFR calc non Af Amer: 26 mL/min — ABNORMAL LOW (ref >60)
GFR calc non Af Amer: 22 mL/min — ABNORMAL LOW (ref >60)
GLUCOSE: 63 mg/dL — AB (ref 65–99)
Glucose, Bld: 188 mg/dL — ABNORMAL HIGH (ref 65–99)
POTASSIUM: 3.3 mmol/L — AB (ref 3.5–5.1)
Potassium: 3.4 mmol/L — ABNORMAL LOW (ref 3.5–5.1)
SODIUM: 136 mmol/L (ref 135–145)
Sodium: 138 mmol/L (ref 135–145)
TOTAL PROTEIN: 4.2 g/dL — AB (ref 6.5–8.1)
Total Bilirubin: 2 mg/dL — ABNORMAL HIGH (ref 0.3–1.2)
Total Protein: 4.2 g/dL — ABNORMAL LOW (ref 6.5–8.1)

## 2015-04-01 LAB — CBC
HCT: 37 % — ABNORMAL LOW (ref 39.0–52.0)
HCT: 40.4 % (ref 39.0–52.0)
HEMOGLOBIN: 12.4 g/dL — AB (ref 13.0–17.0)
HEMOGLOBIN: 14.1 g/dL (ref 13.0–17.0)
MCH: 31.6 pg (ref 26.0–34.0)
MCH: 32.7 pg (ref 26.0–34.0)
MCHC: 33.5 g/dL (ref 30.0–36.0)
MCHC: 34.9 g/dL (ref 30.0–36.0)
MCV: 94.4 fL (ref 78.0–100.0)
MCV: 93.7 fL (ref 78.0–100.0)
Platelets: 128 10*3/uL — ABNORMAL LOW (ref 150–400)
Platelets: 156 10*3/uL (ref 150–400)
RBC: 3.92 MIL/uL — AB (ref 4.22–5.81)
RBC: 4.31 MIL/uL (ref 4.22–5.81)
RDW: 13.7 % (ref 11.5–15.5)
RDW: 14.1 % (ref 11.5–15.5)
WBC: 13 10*3/uL — ABNORMAL HIGH (ref 4.0–10.5)
WBC: 8.3 10*3/uL (ref 4.0–10.5)

## 2015-04-01 LAB — POCT I-STAT 3, ART BLOOD GAS (G3+)
Acid-base deficit: 10 mmol/L — ABNORMAL HIGH (ref 0.0–2.0)
Acid-base deficit: 12 mmol/L — ABNORMAL HIGH (ref 0.0–2.0)
Acid-base deficit: 14 mmol/L — ABNORMAL HIGH (ref 0.0–2.0)
Acid-base deficit: 15 mmol/L — ABNORMAL HIGH (ref 0.0–2.0)
Acid-base deficit: 20 mmol/L — ABNORMAL HIGH (ref 0.0–2.0)
Acid-base deficit: 6 mmol/L — ABNORMAL HIGH (ref 0.0–2.0)
Acid-base deficit: 6 mmol/L — ABNORMAL HIGH (ref 0.0–2.0)
Bicarbonate: 12.1 mEq/L — ABNORMAL LOW (ref 20.0–24.0)
Bicarbonate: 12.8 mEq/L — ABNORMAL LOW (ref 20.0–24.0)
Bicarbonate: 13.6 mEq/L — ABNORMAL LOW (ref 20.0–24.0)
Bicarbonate: 15.4 mEq/L — ABNORMAL LOW (ref 20.0–24.0)
Bicarbonate: 18.4 mEq/L — ABNORMAL LOW (ref 20.0–24.0)
Bicarbonate: 18.7 mEq/L — ABNORMAL LOW (ref 20.0–24.0)
Bicarbonate: 9.5 mEq/L — ABNORMAL LOW (ref 20.0–24.0)
O2 Saturation: 75 %
O2 Saturation: 84 %
O2 Saturation: 86 %
O2 Saturation: 89 %
O2 Saturation: 90 %
O2 Saturation: 91 %
O2 Saturation: 94 %
Patient temperature: 100.8
Patient temperature: 100.9
Patient temperature: 101.4
Patient temperature: 101.5
Patient temperature: 103.5
Patient temperature: 103.7
Patient temperature: 99.8
TCO2: 11 mmol/L (ref 0–100)
TCO2: 13 mmol/L (ref 0–100)
TCO2: 14 mmol/L (ref 0–100)
TCO2: 15 mmol/L (ref 0–100)
TCO2: 16 mmol/L (ref 0–100)
TCO2: 19 mmol/L (ref 0–100)
TCO2: 20 mmol/L (ref 0–100)
pCO2 arterial: 32.8 mmHg — ABNORMAL LOW (ref 35.0–45.0)
pCO2 arterial: 33.1 mmHg — ABNORMAL LOW (ref 35.0–45.0)
pCO2 arterial: 33.1 mmHg — ABNORMAL LOW (ref 35.0–45.0)
pCO2 arterial: 34 mmHg — ABNORMAL LOW (ref 35.0–45.0)
pCO2 arterial: 35.9 mmHg (ref 35.0–45.0)
pCO2 arterial: 36.8 mmHg (ref 35.0–45.0)
pCO2 arterial: 40.1 mmHg (ref 35.0–45.0)
pH, Arterial: 7.026 — CL (ref 7.350–7.450)
pH, Arterial: 7.18 — CL (ref 7.350–7.450)
pH, Arterial: 7.19 — CL (ref 7.350–7.450)
pH, Arterial: 7.23 — ABNORMAL LOW (ref 7.350–7.450)
pH, Arterial: 7.253 — ABNORMAL LOW (ref 7.350–7.450)
pH, Arterial: 7.29 — ABNORMAL LOW (ref 7.350–7.450)
pH, Arterial: 7.364 (ref 7.350–7.450)
pO2, Arterial: 60 mmHg — ABNORMAL LOW (ref 80.0–100.0)
pO2, Arterial: 65 mmHg — ABNORMAL LOW (ref 80.0–100.0)
pO2, Arterial: 66 mmHg — ABNORMAL LOW (ref 80.0–100.0)
pO2, Arterial: 70 mmHg — ABNORMAL LOW (ref 80.0–100.0)
pO2, Arterial: 73 mmHg — ABNORMAL LOW (ref 80.0–100.0)
pO2, Arterial: 74 mmHg — ABNORMAL LOW (ref 80.0–100.0)
pO2, Arterial: 90 mmHg (ref 80.0–100.0)

## 2015-04-01 LAB — GLUCOSE, CAPILLARY
Glucose-Capillary: 109 mg/dL — ABNORMAL HIGH (ref 65–99)
Glucose-Capillary: 111 mg/dL — ABNORMAL HIGH (ref 65–99)
Glucose-Capillary: 13 mg/dL — CL (ref 65–99)
Glucose-Capillary: 44 mg/dL — CL (ref 65–99)
Glucose-Capillary: 47 mg/dL — ABNORMAL LOW (ref 65–99)
Glucose-Capillary: 48 mg/dL — ABNORMAL LOW (ref 65–99)
Glucose-Capillary: 58 mg/dL — ABNORMAL LOW (ref 65–99)
Glucose-Capillary: 63 mg/dL — ABNORMAL LOW (ref 65–99)
Glucose-Capillary: 73 mg/dL (ref 65–99)

## 2015-04-01 LAB — POCT I-STAT 7, (LYTES, BLD GAS, ICA,H+H)
Acid-base deficit: 5 mmol/L — ABNORMAL HIGH (ref 0.0–2.0)
Acid-base deficit: 5 mmol/L — ABNORMAL HIGH (ref 0.0–2.0)
Bicarbonate: 21 mEq/L (ref 20.0–24.0)
Bicarbonate: 20.4 mEq/L (ref 20.0–24.0)
Calcium, Ion: 0.95 mmol/L — ABNORMAL LOW (ref 1.13–1.30)
Calcium, Ion: 0.9 mmol/L — ABNORMAL LOW (ref 1.13–1.30)
HCT: 38 % — ABNORMAL LOW (ref 39.0–52.0)
HCT: 36 % — ABNORMAL LOW (ref 39.0–52.0)
Hemoglobin: 12.9 g/dL — ABNORMAL LOW (ref 13.0–17.0)
Hemoglobin: 12.2 g/dL — ABNORMAL LOW (ref 13.0–17.0)
O2 Saturation: 90 %
O2 Saturation: 89 %
Patient temperature: 37
Patient temperature: 38.5
Potassium: 3 mmol/L — ABNORMAL LOW (ref 3.5–5.1)
Potassium: 3.3 mmol/L — ABNORMAL LOW (ref 3.5–5.1)
Sodium: 137 mmol/L (ref 135–145)
Sodium: 138 mmol/L (ref 135–145)
TCO2: 22 mmol/L (ref 0–100)
TCO2: 22 mmol/L (ref 0–100)
pCO2 arterial: 39.5 mmHg (ref 35.0–45.0)
pCO2 arterial: 40.6 mmHg (ref 35.0–45.0)
pH, Arterial: 7.333 — ABNORMAL LOW (ref 7.350–7.450)
pH, Arterial: 7.315 — ABNORMAL LOW (ref 7.350–7.450)
pO2, Arterial: 62 mmHg — ABNORMAL LOW (ref 80.0–100.0)
pO2, Arterial: 66 mmHg — ABNORMAL LOW (ref 80.0–100.0)

## 2015-04-01 LAB — PHOSPHORUS: Phosphorus: 4 mg/dL (ref 2.5–4.6)

## 2015-04-01 LAB — MAGNESIUM: Magnesium: 2.3 mg/dL (ref 1.7–2.4)

## 2015-04-01 LAB — CG4 I-STAT (LACTIC ACID)
Lactic Acid, Venous: 4.01 mmol/L (ref 0.5–2.0)
Lactic Acid, Venous: 14.27 mmol/L (ref 0.5–2.0)
Lactic Acid, Venous: 16.19 mmol/L (ref 0.5–2.0)

## 2015-04-01 LAB — PROTIME-INR
INR: 6.72 (ref 0.00–1.49)
INR: 1.39 (ref 0.00–1.49)
INR: 1.95 — ABNORMAL HIGH (ref 0.00–1.49)
INR: 3.11 — ABNORMAL HIGH (ref 0.00–1.49)
PROTHROMBIN TIME: 17.1 s — AB (ref 11.6–15.2)
PROTHROMBIN TIME: 31.4 s — AB (ref 11.6–15.2)
PROTHROMBIN TIME: 56.1 s — AB (ref 11.6–15.2)
Prothrombin Time: 22.1 seconds — ABNORMAL HIGH (ref 11.6–15.2)

## 2015-04-01 LAB — TRIGLYCERIDES: Triglycerides: 37 mg/dL (ref <150)

## 2015-04-01 LAB — PREALBUMIN: Prealbumin: 3.8 mg/dL — ABNORMAL LOW (ref 18–38)

## 2015-04-01 LAB — LACTIC ACID, PLASMA: LACTIC ACID, VENOUS: 7.6 mmol/L — AB (ref 0.5–2.0)

## 2015-04-01 SURGERY — LAPAROTOMY, EXPLORATORY
Anesthesia: General | Site: Abdomen

## 2015-04-01 MED ORDER — CEFOTETAN DISODIUM 2 G IJ SOLR
2.0000 g | INTRAMUSCULAR | Status: DC | PRN
Start: 2015-04-01 — End: 2015-04-01
  Administered 2015-04-01: 2 g via INTRAVENOUS

## 2015-04-01 MED ORDER — SODIUM CHLORIDE 0.9 % IV BOLUS (SEPSIS)
1000.0000 mL | Freq: Once | INTRAVENOUS | Status: AC
  Administered 2015-04-01: 1000 mL via INTRAVENOUS

## 2015-04-01 MED ORDER — EPINEPHRINE HCL 1 MG/ML IJ SOLN
0.5000 ug/min | INTRAVENOUS | Status: DC
  Administered 2015-04-01: 1 ug/min via INTRAVENOUS
  Administered 2015-04-01: 20 ug/min via INTRAVENOUS
  Filled 2015-04-01 (×3): qty 4

## 2015-04-01 MED ORDER — PROPOFOL 10 MG/ML IV BOLUS
INTRAVENOUS | Status: AC
  Filled 2015-04-01: qty 20

## 2015-04-01 MED ORDER — DEXTROSE 50 % IV SOLN
25.0000 g | Freq: Once | INTRAVENOUS | Status: AC
  Administered 2015-04-01: 25 g via INTRAVENOUS

## 2015-04-01 MED ORDER — AMIODARONE HCL IN DEXTROSE 360-4.14 MG/200ML-% IV SOLN
INTRAVENOUS | Status: DC | PRN
  Administered 2015-04-01: 30 mg/h via INTRAVENOUS

## 2015-04-01 MED ORDER — CALCIUM GLUCONATE 10 % IV SOLN
2.0000 g | Freq: Once | INTRAVENOUS | Status: AC
  Administered 2015-04-01: 2 g via INTRAVENOUS
  Filled 2015-04-01: qty 20

## 2015-04-01 MED ORDER — EMPTY CONTAINERS FLEXIBLE MISC
5000.0000 [IU] | Status: AC
  Administered 2015-04-01: 5000 [IU] via INTRAVENOUS
  Filled 2015-04-01: qty 200

## 2015-04-01 MED ORDER — SODIUM CHLORIDE 0.9 % IV SOLN
25.0000 ug/h | INTRAVENOUS | Status: DC
  Administered 2015-04-01: 25 ug/h via INTRAVENOUS
  Filled 2015-04-01: qty 50

## 2015-04-01 MED ORDER — SODIUM CHLORIDE 0.9 % IV SOLN: Freq: Once | INTRAVENOUS | Status: AC

## 2015-04-01 MED ORDER — TRACE MINERALS CR-CU-MN-SE-ZN 10-1000-500-60 MCG/ML IV SOLN
INTRAVENOUS | Status: DC
  Filled 2015-04-01: qty 960

## 2015-04-01 MED ORDER — SODIUM BICARBONATE 8.4 % IV SOLN
INTRAVENOUS | Status: AC
  Filled 2015-04-01: qty 100

## 2015-04-01 MED ORDER — SODIUM CHLORIDE 0.9 % IV SOLN
INTRAVENOUS | Status: DC | PRN
  Administered 2015-04-01 (×3): via INTRAVENOUS

## 2015-04-01 MED ORDER — FENTANYL BOLUS VIA INFUSION
25.0000 ug | INTRAVENOUS | Status: DC | PRN
  Filled 2015-04-01: qty 25

## 2015-04-01 MED ORDER — ROCURONIUM BROMIDE 100 MG/10ML IV SOLN
INTRAVENOUS | Status: DC | PRN
  Administered 2015-04-01 (×2): 50 mg via INTRAVENOUS

## 2015-04-01 MED ORDER — HEPARIN SODIUM (PORCINE) 1000 UNIT/ML DIALYSIS
1000.0000 [IU] | INTRAMUSCULAR | Status: DC | PRN
  Filled 2015-04-01: qty 6

## 2015-04-01 MED ORDER — HEPARIN (PORCINE) IN NACL 100-0.45 UNIT/ML-% IJ SOLN
1250.0000 [IU]/h | INTRAMUSCULAR | Status: DC
  Filled 2015-04-01: qty 250

## 2015-04-01 MED ORDER — PRISMASOL BGK 4/2.5 32-4-2.5 MEQ/L IV SOLN
INTRAVENOUS | Status: DC
  Administered 2015-04-01: 14:00:00 via INTRAVENOUS_CENTRAL
  Filled 2015-04-01 (×9): qty 5000

## 2015-04-01 MED ORDER — SODIUM CHLORIDE 0.9 % IV SOLN
INTRAVENOUS | Status: AC
  Administered 2015-04-01: 01:00:00 via INTRAVENOUS

## 2015-04-01 MED ORDER — PRISMASOL BGK 4/2.5 32-4-2.5 MEQ/L IV SOLN
INTRAVENOUS | Status: DC
  Administered 2015-04-01 (×2): via INTRAVENOUS_CENTRAL
  Filled 2015-04-01 (×3): qty 5000

## 2015-04-01 MED ORDER — DEXTROSE 5 % IV SOLN
INTRAVENOUS | Status: DC
  Administered 2015-04-01: 09:00:00 via INTRAVENOUS
  Filled 2015-04-01 (×3): qty 150

## 2015-04-01 MED ORDER — DEXTROSE 50 % IV SOLN
INTRAVENOUS | Status: AC
  Filled 2015-04-01: qty 50

## 2015-04-01 MED ORDER — FLUCONAZOLE IN SODIUM CHLORIDE 400-0.9 MG/200ML-% IV SOLN
400.0000 mg | INTRAVENOUS | Status: DC
  Administered 2015-04-01: 400 mg via INTRAVENOUS
  Filled 2015-04-01: qty 200

## 2015-04-01 MED ORDER — SODIUM BICARBONATE 8.4 % IV SOLN
INTRAVENOUS | Status: AC
  Filled 2015-04-01: qty 50

## 2015-04-01 MED ORDER — DEXTROSE 50 % IV SOLN
INTRAVENOUS | Status: AC
  Administered 2015-04-01: 25 g via INTRAVENOUS
  Filled 2015-04-01: qty 50

## 2015-04-01 MED ORDER — DEXTROSE 50 % IV SOLN
1.0000 | Freq: Once | INTRAVENOUS | Status: AC
  Administered 2015-04-01: 50 mL via INTRAVENOUS

## 2015-04-01 MED ORDER — SUFENTANIL CITRATE 50 MCG/ML IV SOLN
INTRAVENOUS | Status: AC
  Filled 2015-04-01: qty 1

## 2015-04-01 MED ORDER — DEXTROSE 50 % IV SOLN
INTRAVENOUS | Status: AC
  Administered 2015-04-01: 50 mL
  Filled 2015-04-01: qty 50

## 2015-04-01 MED ORDER — ALTEPLASE 2 MG IJ SOLR
2.0000 mg | Freq: Once | INTRAMUSCULAR | Status: DC | PRN
  Filled 2015-04-01: qty 2

## 2015-04-01 MED ORDER — SODIUM BICARBONATE 8.4 % IV SOLN
100.0000 meq | Freq: Once | INTRAVENOUS | Status: AC
  Administered 2015-04-01: 100 meq via INTRAVENOUS

## 2015-04-01 MED ORDER — SUFENTANIL CITRATE 50 MCG/ML IV SOLN
INTRAVENOUS | Status: DC | PRN
  Administered 2015-04-01 (×2): 10 ug via INTRAVENOUS

## 2015-04-01 MED ORDER — ALBUMIN HUMAN 5 % IV SOLN
INTRAVENOUS | Status: DC | PRN
  Administered 2015-04-01: 05:00:00 via INTRAVENOUS

## 2015-04-01 MED ORDER — CEFOTETAN DISODIUM-DEXTROSE 2-2.08 GM-% IV SOLR
INTRAVENOUS | Status: AC
  Filled 2015-04-01: qty 50

## 2015-04-01 MED ORDER — SODIUM BICARBONATE 8.4 % IV SOLN
INTRAVENOUS | Status: DC | PRN
  Administered 2015-04-01: 50 meq via INTRAVENOUS
  Administered 2015-04-01: 100 meq via INTRAVENOUS
  Administered 2015-04-01: 50 meq via INTRAVENOUS

## 2015-04-01 MED ORDER — ALBUMIN HUMAN 5 % IV SOLN
INTRAVENOUS | Status: AC
  Filled 2015-04-01: qty 500

## 2015-04-01 MED ORDER — PRISMASOL BGK 4/2.5 32-4-2.5 MEQ/L IV SOLN
INTRAVENOUS | Status: DC
  Filled 2015-04-01 (×2): qty 5000

## 2015-04-01 MED ORDER — 0.9 % SODIUM CHLORIDE (POUR BTL) OPTIME
TOPICAL | Status: DC | PRN
  Administered 2015-04-01: 3000 mL

## 2015-04-01 MED ORDER — SODIUM BICARBONATE 8.4 % IV SOLN
100.0000 meq | Freq: Once | INTRAVENOUS | Status: AC
  Administered 2015-04-01: 100 meq via INTRAVENOUS
  Filled 2015-04-01: qty 100

## 2015-04-01 MED ORDER — PIPERACILLIN-TAZOBACTAM 3.375 G IVPB 30 MIN
3.3750 g | Freq: Four times a day (QID) | INTRAVENOUS | Status: DC
  Administered 2015-04-01: 3.375 g via INTRAVENOUS
  Filled 2015-04-01 (×4): qty 50

## 2015-04-01 MED ORDER — SODIUM BICARBONATE 8.4 % IV SOLN
INTRAVENOUS | Status: AC
  Administered 2015-04-01: 100 meq via INTRAVENOUS
  Filled 2015-04-01: qty 50

## 2015-04-01 MED ORDER — HEPARIN SODIUM (PORCINE) 1000 UNIT/ML DIALYSIS
2400.0000 [IU] | INTRAMUSCULAR | Status: DC | PRN
  Administered 2015-04-01: 2400 [IU] via INTRAVENOUS_CENTRAL
  Filled 2015-04-01 (×2): qty 3

## 2015-04-01 MED ORDER — HEPARIN (PORCINE) 2000 UNITS/L FOR CRRT
INTRAVENOUS_CENTRAL | Status: DC | PRN
  Filled 2015-04-01: qty 1000

## 2015-04-01 MED ORDER — MIDAZOLAM HCL 2 MG/2ML IJ SOLN
INTRAMUSCULAR | Status: DC | PRN
  Administered 2015-04-01: 2 mg via INTRAVENOUS

## 2015-04-01 MED ORDER — MIDAZOLAM HCL 2 MG/2ML IJ SOLN
INTRAMUSCULAR | Status: AC
  Filled 2015-04-01: qty 2

## 2015-04-01 MED ORDER — METOCLOPRAMIDE HCL 5 MG/ML IJ SOLN: 5.0000 mg | Freq: Three times a day (TID) | INTRAMUSCULAR | Status: DC

## 2015-04-01 MED ORDER — DEXTROSE-NACL 5-0.9 % IV SOLN
INTRAVENOUS | Status: AC
  Administered 2015-04-01: 07:00:00 via INTRAVENOUS

## 2015-04-01 SURGICAL SUPPLY — 52 items
BLADE SURG ROTATE 9660 (MISCELLANEOUS) IMPLANT
CANISTER SUCTION 2500CC (MISCELLANEOUS) ×3 IMPLANT
CHLORAPREP W/TINT 26ML (MISCELLANEOUS) ×3 IMPLANT
COVER MAYO STAND STRL (DRAPES) IMPLANT
COVER SURGICAL LIGHT HANDLE (MISCELLANEOUS) ×3 IMPLANT
DRAPE LAPAROSCOPIC ABDOMINAL (DRAPES) ×3 IMPLANT
DRAPE PROXIMA HALF (DRAPES) IMPLANT
DRAPE UTILITY XL STRL (DRAPES) ×6 IMPLANT
DRAPE WARM FLUID 44X44 (DRAPE) ×3 IMPLANT
DRSG OPSITE POSTOP 4X10 (GAUZE/BANDAGES/DRESSINGS) IMPLANT
DRSG OPSITE POSTOP 4X8 (GAUZE/BANDAGES/DRESSINGS) IMPLANT
DRSG VAC ATS LRG SENSATRAC (GAUZE/BANDAGES/DRESSINGS) ×4 IMPLANT
ELECT BLADE 6.5 EXT (BLADE) IMPLANT
ELECT CAUTERY BLADE 6.4 (BLADE) ×6 IMPLANT
ELECT REM PT RETURN 9FT ADLT (ELECTROSURGICAL) ×3
ELECTRODE REM PT RTRN 9FT ADLT (ELECTROSURGICAL) ×1 IMPLANT
GLOVE BIO SURGEON STRL SZ7.5 (GLOVE) ×3 IMPLANT
GLOVE BIOGEL PI IND STRL 8 (GLOVE) ×1 IMPLANT
GLOVE BIOGEL PI INDICATOR 8 (GLOVE) ×2
GOWN STRL REUS W/ TWL LRG LVL3 (GOWN DISPOSABLE) ×2 IMPLANT
GOWN STRL REUS W/ TWL XL LVL3 (GOWN DISPOSABLE) ×1 IMPLANT
GOWN STRL REUS W/TWL LRG LVL3 (GOWN DISPOSABLE) ×6
GOWN STRL REUS W/TWL XL LVL3 (GOWN DISPOSABLE) ×3
KIT BASIN OR (CUSTOM PROCEDURE TRAY) ×3 IMPLANT
KIT ROOM TURNOVER OR (KITS) ×3 IMPLANT
LIGASURE IMPACT 36 18CM CVD LR (INSTRUMENTS) IMPLANT
NS IRRIG 1000ML POUR BTL (IV SOLUTION) ×6 IMPLANT
PACK GENERAL/GYN (CUSTOM PROCEDURE TRAY) ×3 IMPLANT
PAD ARMBOARD 7.5X6 YLW CONV (MISCELLANEOUS) ×6 IMPLANT
PAD NEG PRESSURE SENSATRAC (MISCELLANEOUS) ×2 IMPLANT
PENCIL BUTTON HOLSTER BLD 10FT (ELECTRODE) IMPLANT
RELOAD PROXIMATE 75MM BLUE (ENDOMECHANICALS) ×3 IMPLANT
RELOAD STAPLE 75 3.8 BLU REG (ENDOMECHANICALS) IMPLANT
SPECIMEN JAR LARGE (MISCELLANEOUS) IMPLANT
SPONGE LAP 18X18 X RAY DECT (DISPOSABLE) IMPLANT
STAPLER 90 3.5 STAND SLIM (STAPLE) ×3
STAPLER 90 3.5 STD SLIM (STAPLE) IMPLANT
STAPLER CUT CVD 40MM BLUE (STAPLE) ×2 IMPLANT
STAPLER PROXIMATE 75MM BLUE (STAPLE) ×2 IMPLANT
STAPLER VISISTAT 35W (STAPLE) ×5 IMPLANT
SUCTION POOLE TIP (SUCTIONS) ×3 IMPLANT
SUT PDS AB 1 TP1 96 (SUTURE) ×6 IMPLANT
SUT PROLENE 0 SH 30 (SUTURE) ×2 IMPLANT
SUT SILK 2 0 SH CR/8 (SUTURE) ×5 IMPLANT
SUT SILK 2 0 TIES 10X30 (SUTURE) ×3 IMPLANT
SUT SILK 3 0 SH CR/8 (SUTURE) ×3 IMPLANT
SUT SILK 3 0 TIES 10X30 (SUTURE) ×3 IMPLANT
TOWEL OR 17X26 10 PK STRL BLUE (TOWEL DISPOSABLE) ×3 IMPLANT
TRAY FOLEY CATH 16FRSI W/METER (SET/KITS/TRAYS/PACK) IMPLANT
TUBE CONNECTING 12'X1/4 (SUCTIONS)
TUBE CONNECTING 12X1/4 (SUCTIONS) IMPLANT
YANKAUER SUCT BULB TIP NO VENT (SUCTIONS) IMPLANT

## 2015-04-02 LAB — CULTURE, RESPIRATORY W GRAM STAIN

## 2015-04-02 LAB — CULTURE, RESPIRATORY

## 2015-04-02 LAB — PREPARE FRESH FROZEN PLASMA
UNIT DIVISION: 0
UNIT DIVISION: 0
UNIT DIVISION: 0
Unit division: 0
Unit division: 0
Unit division: 0

## 2015-04-03 ENCOUNTER — Encounter (HOSPITAL_COMMUNITY): Payer: Self-pay | Admitting: General Surgery

## 2015-04-03 NOTE — Code Documentation (Signed)
CODE BLUE NOTE  Patient Name: Chris Reyes   MRN: [id number]   Date of Birth/ Sex: [birth year] , male      Admission Date: 03/17/2015  Attending Provider: Ivin Poot, MD  Primary Diagnosis: <principal problem not specified>    Indication: Pt was not in his usual state of health. His blood pressure was in the 20s and then he was noted to be PEA. Code blue was subsequently called. At the time of arrival on scene, ACLS protocol was underway.    Technical Description:  - CPR performance duration:   3 minutes  - Was defibrillation or cardioversion used? No   - Was external pacer placed? No  - Was patient intubated pre/post CPR? Yes    Medications Administered: Y = Yes; Blank = No Amiodarone  N  Atropine  N  Calcium  N  Epinephrine  Y x2  Lidocaine  N  Magnesium  N  Norepinephrine  N  Phenylephrine  N  Sodium bicarbonate  Y x1  Vasopressin  N    Post CPR evaluation:  - Final Status - Was patient successfully resuscitated ? Yes - What is current rhythm? sinus - What is current hemodynamic status? unstable   Miscellaneous Information:  - Labs sent, including: None seen  - Primary team notified?  Yes  - Family Notified? Yes  - Additional notes/ transfer status: None         Carlyle Dolly, MD  Apr 08, 2015, 2:34 PM

## 2015-04-03 NOTE — Progress Notes (Signed)
PULMONARY / CRITICAL CARE MEDICINE   Name: Chris Reyes MRN: [id number] DOB: Jan 10, 1951    ADMISSION DATE:  03/18/2015 CONSULTATION DATE:  03/31/2015  REFERRING MD :  Roxan Hockey  CHIEF COMPLAINT:  Respiratory distress, tachypnea post op  INITIAL PRESENTATION: 64yo M 4 days post op (7/25) aortic valve replacement and CABG who developed rapid afib, progressive weakness, dizziness, nausea. CXR showed LLL infiltrate with concern for PNA. On 7/29 patient was having tachypnea that worsened throughout the day and PCCM was consulted. Patient was intubated for acute respiratory failure.   STUDIES:  7/29 CT abd/pelvis >> Free intraperitoneal air consistent with perforated bowel, suspect colon perf, midline ventral hernia 7/29 CT chest >>dense consolidation left base, small left pleural effusion  SIGNIFICANT EVENTS: 7/25 CABG and AV replacement 7/29 Intubated for respiratory distress 7/30 Emergent ex lap, sigmoid resection, vac placement   SUBJECTIVE: Emergent Ex-lap last night, in severe shock  VITAL SIGNS: Temp:  [97.2 F (36.2 C)-103.7 F (39.8 C)] 101.1 F (38.4 C) (07/30 0715) Pulse Rate:  [25-129] 108 (07/30 0715) Resp:  [22-48] 35 (07/30 0715) BP: (76-172)/(15-105) 125/80 mmHg (07/30 0700) SpO2:  [83 %-100 %] 100 % (07/30 0715) Arterial Line BP: (43-141)/(28-75) 104/57 mmHg (07/30 0715) FiO2 (%):  [50 %-100 %] 100 % (07/30 0515) HEMODYNAMICS: CVP:  [7 mmHg-18 mmHg] 7 mmHg VENTILATOR SETTINGS: Vent Mode:  [-] PRVC FiO2 (%):  [50 %-100 %] 100 % Set Rate:  [16 bmp-35 bmp] 35 bmp Vt Set:  [600 mL] 600 mL PEEP:  [5 cmH20] 5 cmH20 Plateau Pressure:  [19 cmH20-25 cmH20] 25 cmH20 INTAKE / OUTPUT:  Intake/Output Summary (Last 24 hours) at 2015-04-13 0727 Last data filed at 2015-04-13 0455  Gross per 24 hour  Intake 5928.5 ml  Output   5135 ml  Net  793.5 ml    PHYSICAL EXAMINATION: General:  On vent, sedated HENT: NCAT, ETT in place PULM: Few crackles bases, vent supported  breaths CV: Tachy, regular Chest:midline scar well healed AB: no bowel sounds, open ventral wound with VAC in place Ext: warm, no edema, SCDs in place Neuro: sedated heavily on vent  LABS:  CBC  Recent Labs Lab 03/31/15 0215  04-13-2015 0037  04/13/15 0328 04-13-2015 0423 04/13/2015 0653  WBC 9.5  --  8.3  --   --   --  13.0*  HGB 16.1  < > 14.1  < > 12.9* 12.2* 12.4*  HCT 46.0  < > 40.4  < > 38.0* 36.0* 37.0*  PLT 203  --  156  --   --   --  128*  < > = values in this interval not displayed. Coag's  Recent Labs Lab 04/02/2015 1459  03/31/15 0215 04/13/2015 0037 13-Apr-2015 0330  APTT 31  --   --   --   --   INR 1.47  < > 2.09* 6.72* 1.39  < > = values in this interval not displayed. BMET  Recent Labs Lab 03/31/15 0215  Apr 13, 2015 0037 04-13-2015 0043 Apr 13, 2015 0328 04-13-2015 0423 13-Apr-2015 0653  NA 132*  < > 136 135 137 138 138  K 3.9  < > 3.3* 3.3* 3.0* 3.3* 3.4*  CL 95*  < > 98* 95*  --   --  100*  CO2 26  --  22  --   --   --  17*  BUN 36*  < > 62* 54*  --   --  59*  CREATININE 1.06  < > 2.42* 2.40*  --   --  2.78*  GLUCOSE 135*  < > 188* 193*  --   --  63*  < > = values in this interval not displayed. Electrolytes  Recent Labs Lab 03/28/15 1615  03/30/15 0420  03/31/15 0215 2015/04/25 0037 04-25-15 2542  CALCIUM  --   < > 8.4*  < > 8.8* 7.0* 6.6*  MG 1.9  --  2.4  --   --  2.3  --   PHOS  --   --   --   --   --  4.0  --   < > = values in this interval not displayed. Sepsis Markers  Recent Labs Lab 03/31/15 1952 03/31/15 2200 2015/04/25 0100  LATICACIDVEN 4.01* 5.0* 7.6*   ABG  Recent Labs Lab April 25, 2015 0120 04/25/2015 0328 2015-04-25 0423  PHART 7.290* 7.333* 7.315*  PCO2ART 40.1 39.5 40.6  PO2ART 90.0 62.0* 66.0*   Liver Enzymes  Recent Labs Lab 03/31/15 0215 2015-04-25 0037 04-25-2015 0653  AST 26 49* 134*  ALT 20 24 74*  ALKPHOS 41 35* 36*  BILITOT 1.4* 1.8* 2.0*  ALBUMIN 2.6* 1.7* 1.8*   Cardiac Enzymes No results for input(s): TROPONINI,  PROBNP in the last 168 hours. Glucose  Recent Labs Lab 03/31/15 2152 03/31/15 2221 03/31/15 2342 Apr 25, 2015 0030 04/25/15 0622 Apr 25, 2015 0650  GLUCAP 58* 112* 73 47* 13* 63*    Imaging  7/30 CXR L > R opacities, R arm PICC in place, L IJ, ETT all in good position  ASSESSMENT / PLAN:  PULMONARY OETT 7/28 >> A: Acute respiratory distress with tachypnea HCAP Pulmonary edema P:   Full vent support Daily ABG CXR daily Daily WUA/SBT when able   CARDIOVASCULAR PICC line R arm 7/29 >> L IJ CVL 7/30 >  A:  Post op CABG/AV replacement 7/25 Septic shock> multiple vasopressors 7/30 Afib with RVR post op P:  Neo, levophed, dopamine, vasopressin for goal SBP >90, or MAP >60 Amiodarone per cards D/c metoprolol Hold warfarin > Heparin gtt OK at 1300 today  Repeat lactic acid  RENAL A:   AKI > currently pH and K OK but oliguric  P:   Consult renal for CVVHD Monitor BMET and UOP Replace electrolytes as needed   GASTROINTESTINAL A:   Colonic perforation> s/p sigmoid colectomy, wound vac 7/30 Open wound with wound VAC P:   Post op care per gen surgery NPO Pantoprazole for stress ulcer prophylaxis  HEMATOLOGIC A:   DVT prevention > INR 1.9 7/30 AM P:  Continue monitoring CBC  Coumadin for now  INFECTIOUS A:   HCAP Septic shock from peritonitis, colon perforation P:   Sputum culture 7/29 >>  Abx:  Cefuroxime start 7/26 >> 7/27 Ceftazidime 7/28 >> 7/29 Zosyn start 7/29, day 2/x Vanc start 7/29, day 2/x   ENDOCRINE A:   Hyperglycemia - well controlled P:   Continue SSI hold Levemir , may need insulin gtt   NEUROLOGIC A:   Sedated with intubation / dyschrony P:   RASS goal: -2 Fentanyl gtt  FAMILY  - Updates: family not present  - Inter-disciplinary family meet or Palliative Care meeting due by:  8/5  Ccm time 45 min   Roselie Awkward, MD [facility] Pager: [phone] Cell: [phone] After 3pm or if no response, call [phone]    04/25/15, 7:27 AM

## 2015-04-03 NOTE — Progress Notes (Addendum)
      [address]       [place], [address]             [phone]       CTSP for PEA arrest.  Prior to arrest he was hypotensive despite high doses of epi, norepi, vasopressin, neo and dopamine. Just starting CVVH with plan to run positive. He has had further abdominal distention.   He was noted to drop his Bp into the 30 and then had PEA. CPR, 2 amps of epi and 1 amp of bicarb resulted in ROSC.  Dr. Lake Bells present. We agree that there is no hope for meaningful survival. His son is on the way in to the hospital.   I suspect he has ischemic/ infarcted bowel given the marked increase in his abdominal girth.  Revonda Standard Roxan Hockey, MD [facility] [phone]   I talked with Mr. Kersh son and informed him of the recent events. I discussed the futility of continued resuscitation attempts in this setting. He wishes to make his father a no code blue but not discontinue any current therapies. We will not do any more CPR or defibrillations.  Revonda Standard Roxan Hockey, MD [facility] [phone]

## 2015-04-03 NOTE — Progress Notes (Signed)
CRITICAL VALUE ALERT  Critical value received:  Lactic Acid 14, repeat 16  Date of notification:  Apr 09, 2015   Time of notification:  7425  Critical value read back:Yes.    Nurse who received alert:  Achille Rich, RN  MD notified (1st page):  [place]   Time of first page:  1005  Responding MD:  Lake Bells  Time MD responded:  1005

## 2015-04-03 NOTE — Progress Notes (Signed)
Hypoglycemic Event  CBG: 44  Treatment: D50 IV 50 mL  Symptoms: Pale and Sweaty  Follow-up CBG: Time:1215 CBG Result:109  Possible Reasons for Event: Other: sepsis   Vena Austria  Remember to initiate Hypoglycemia Order Set & complete

## 2015-04-03 NOTE — Progress Notes (Signed)
Pt arrived back to room after surgery at 0515.

## 2015-04-03 NOTE — Progress Notes (Signed)
Dr. Rosendo Gros on unit.  Aware of BP and CVP.  Order received for another 1 liter of NS bolus to be given over an hour.

## 2015-04-03 NOTE — Progress Notes (Addendum)
Dr. Jimmy Footman notified of SBP 80's with MAP 60's and patient currently on Levophed at 50 which was above Max limit and Neosynephrine currently at 150.  Dr. Oletta Darter previously had wanted to wean off of Neosynephrine once Levophed started, however unable to do so.  Therefore Dr. Jimmy Footman made aware of above.

## 2015-04-03 NOTE — Progress Notes (Signed)
Dr. Oletta Darter made aware of SBP 80's and that patient was maxed out on Neosynephrine @ 271mcg at this time.  Also aware of patient started on Dopamine to maintain MAP 70 and also due to no urine output for several hours as well as elevated Creatinine per Dr. Roxan Hockey.  HR now in upper 120's.  Dopamine turned down to 3 mcg/kg/min.  Order received to start patient on Levophed drip and slowly back down on Neosynephrine as tolerated.

## 2015-04-03 NOTE — Progress Notes (Signed)
As we were infusing FFP at the [facility], INR result of 6.7 was called back.  I d/w Dr. Roxan Hockey the possibility of using Kcentra with his recent CABG and AVR, and he states it would OK to use.  It is a difficult situation considering his recent surgery,and possible clotting of his valve, and need for emergent exploration.

## 2015-04-03 NOTE — Progress Notes (Signed)
CRITICAL VALUE ALERT  Critical value received:  Lactic acid 5  Date of notification:  03/31/15  Time of notification:  2312  Critical value read back:Yes.    Nurse who received alert:  Vista Lawman, RN  MD notified (1st page):  Dr. Jimmy Footman  Time of first page:  2316  Responding MD:  Dr. Jimmy Footman   Time MD responded:  2316

## 2015-04-03 NOTE — Progress Notes (Signed)
ANTIBIOTIC CONSULT NOTE - Follow Up  Pharmacy Consult for vancomycin/zosyn/fluconazole Indication:  Sepsis, sigmoid colon perforation  No Known Allergies  Patient Measurements: Height: 5\' 11"  (180.3 cm) Weight: 244 lb 0.8 oz (110.7 kg) IBW/kg (Calculated) : 75.3  Vital Signs: Temp: 100.3 F (37.9 C) (07/30 1245) Temp Source: Core (Comment) (07/30 1200) BP: 83/55 mmHg (07/30 1245) Pulse Rate: 61 (07/30 1230) Intake/Output from previous day: 07/29 0701 - 07/30 0700 In: 6888.3 [P.O.:240; I.V.:4136.3; Blood:1082; NG/GT:60; IV Piggyback:1370] Out: 2703 [Urine:1055; Emesis/NG output:4100] Intake/Output from this shift: Total I/O In: 2577 [I.V.:1547; NG/GT:30; IV Piggyback:1000] Out: 835 [Urine:35; Emesis/NG output:600; Drains:200]  Labs:  Recent Labs  03/31/15 0215  04-25-15 0037 April 25, 2015 0043 04/25/2015 0328 04-25-2015 0423 April 25, 2015 0653  WBC 9.5  --  8.3  --   --   --  13.0*  HGB 16.1  < > 14.1 13.9 12.9* 12.2* 12.4*  PLT 203  --  156  --   --   --  128*  CREATININE 1.06  < > 2.42* 2.40*  --   --  2.78*  < > = values in this interval not displayed. Estimated Creatinine Clearance: 33.5 mL/min (by C-G formula based on Cr of 2.78). No results for input(s): VANCOTROUGH, VANCOPEAK, VANCORANDOM, GENTTROUGH, GENTPEAK, GENTRANDOM, TOBRATROUGH, TOBRAPEAK, TOBRARND, AMIKACINPEAK, AMIKACINTROU, AMIKACIN in the last 72 hours.   Microbiology: Recent Results (from the past 720 hour(s))  Surgical pcr screen     Status: None   Collection Time: 03/23/15  1:53 PM  Result Value Ref Range Status   MRSA, PCR NEGATIVE NEGATIVE Final   Staphylococcus aureus NEGATIVE NEGATIVE Final    Comment:        The Xpert SA Assay (FDA approved for NASAL specimens in patients over 65 years of age), is one component of a comprehensive surveillance program.  Test performance has been validated by [facility] for patients greater than or equal to 65 year old. It is not intended to diagnose infection  nor to guide or monitor treatment.   Culture, respiratory (NON-Expectorated)     Status: None (Preliminary result)   Collection Time: 03/31/15  3:30 PM  Result Value Ref Range Status   Specimen Description TRACHEAL ASPIRATE  Final   Special Requests NONE  Final   Gram Stain   Final    RARE WBC PRESENT, PREDOMINANTLY MONONUCLEAR RARE SQUAMOUS EPITHELIAL CELLS PRESENT ABUNDANT GRAM NEGATIVE RODS FEW GRAM POSITIVE RODS FEW YEAST    Culture   Final    MODERATE GRAM NEGATIVE RODS Performed at [facility]    Report Status PENDING  Incomplete  Culture, blood (routine x 2)     Status: None (Preliminary result)   Collection Time: 03/31/15 10:05 PM  Result Value Ref Range Status   Specimen Description BLOOD CENTRAL LINE  Final   Special Requests BOTTLES DRAWN AEROBIC AND ANAEROBIC 10CC  Final   Culture NO GROWTH < 12 HOURS  Final   Report Status PENDING  Incomplete  Culture, blood (routine x 2)     Status: None (Preliminary result)   Collection Time: 03/31/15 10:05 PM  Result Value Ref Range Status   Specimen Description BLOOD CENTRAL LINE  Final   Special Requests BOTTLES DRAWN AEROBIC AND ANAEROBIC 10CC  Final   Culture NO GROWTH < 12 HOURS  Final   Report Status PENDING  Incomplete   Assessment: 32 YOM s/p CBAG x3 & bioprosthetic AVR, completed post-op vanc/zinacef and started on fortaz  for possible pneumonia. Now with increasing WOB,  CXR - worsening air space disease in left lung.   Acutely decompensated during the day > OR found to have sigmoid colon perforation. Acute renal failure > CVVHD  Post op ABX - will adjust doses vancomycin zosyn, fluconazole Tm 102, wbc wnl> 13   Fortaz 7/28 >>7/29 Vancomycin 7/29 >> Zosyn 7/29 >> Fluconazole 7/30> Vanc/zinacef for surgical px 7/25 >> 7/27  Goal of Therapy:  Vancomycin trough level 15-20 mcg/ml  Plan:  - Vancomycin 2g loading dose then 1250mg  q24 - Zosyn 3.375g IV Q6  Fluconazole 400mg  q24 - Monitor renal  function and clinical improvement - Vancomycin trough at steady state.  Bonnita Nasuti Pharm.D. CPP, BCPS Clinical Pharmacist [phone] Apr 21, 2015 1:10 PM

## 2015-04-03 NOTE — Progress Notes (Signed)
CRITICAL VALUE ALERT  Critical value received:  Lactic Acid 4.01  Date of notification:  03/31/2015  Time of notification: 2014  Critical value read back: yes  Nurse who received alert: Beckie Salts, RN  MD notified (1st page):  2014  Time of first page: 2014  MD notified (2nd page):  Time of second page:  Responding MD:  Dr. Oletta Darter  Time MD responded:  Dr. Oletta Darter

## 2015-04-03 NOTE — Progress Notes (Signed)
CRITICAL VALUE ALERT  Critical value received:  INR= 6.72  Date of notification: T  Time of notification: 5009  Critical value read back:Yes.    Nurse who received alert:  Arloa Koh, RN   MD notified (1st page):  Dr Rosendo Gros  Time of first page:  At bedside  MD notified (2nd page):  Time of second page:  Responding MD:  Dr Rosendo Gros  Time MD responded:  At bedside

## 2015-04-03 NOTE — Progress Notes (Signed)
Hypoglycemic Event  CBG: 63  Treatment: 1 amp Dextrose and start IV fluids D5NS @ 50cc/hr  Symptoms: clammy/cool  Follow-up CBG: Time: 0800 CBG Result:48  Possible Reasons for Event: sepsis  Comments/MD notified: Marylouise Stacks  Remember to initiate Hypoglycemia Order Set & complete

## 2015-04-03 NOTE — Progress Notes (Signed)
ANTICOAGULATION CONSULT NOTE - Initial Consult  Pharmacy Consult for heparin gtt Indication: DVT and bioprosthetic AVR  No Known Allergies  Patient Measurements: Height: 5\' 11"  (180.3 cm) Weight: 244 lb 0.8 oz (110.7 kg) IBW/kg (Calculated) : 75.3 Heparin Dosing Weight: 99 kg  Vital Signs: Temp: 101.1 F (38.4 C) (07/30 0715) Temp Source: Oral (07/29 2000) BP: 125/80 mmHg (07/30 0700) Pulse Rate: 108 (07/30 0715)  Labs:  Recent Labs  03/31/15 0215  Apr 15, 2015 0037 2015-04-15 0043 Apr 15, 2015 0328 2015-04-15 0330 15-Apr-2015 0423 Apr 15, 2015 0653  HGB 16.1  < > 14.1 13.9 12.9*  --  12.2* 12.4*  HCT 46.0  < > 40.4 41.0 38.0*  --  36.0* 37.0*  PLT 203  --  156  --   --   --   --  128*  LABPROT 23.3*  --  56.1*  --   --  17.1*  --  22.1*  INR 2.09*  --  6.72*  --   --  1.39  --  1.95*  CREATININE 1.06  < > 2.42* 2.40*  --   --   --  2.78*  < > = values in this interval not displayed.  Estimated Creatinine Clearance: 33.5 mL/min (by C-G formula based on Cr of 2.78).   Medical History: Past Medical History  Diagnosis Date  . Hypertension   . Hernia   . COPD (chronic obstructive pulmonary disease)   . CAD (coronary artery disease)     Catheterization, [place], July, 2006, EF 45%,  50% LAD, 50% RCA, 90% marginal, possible collaterals from LAD  to a branch of the PDA, medical therapy recommended  . PAD (peripheral artery disease)     Arterial leg Dopplers, April, 2014, normal  . Ejection fraction     EF 45%, catheterization, 2006  //   EF 55%, echo, March, 2012, wall abnormality at the base of the inferior wall  . Aortic stenosis     severe echo 01/2013- cannot rule out bicuspid aortic valve  //   moderately severe, echo,  March, 2012 ( progression since 2009)  . Pulmonary emboli 2007    Significant, 2007, Coumadin therapy  . Warfarin anticoagulation     History pulmonary emboli  . Tobacco abuse   . Carotid artery disease     Doppler, March, 2012,  0-39% bilateral  .  Dyslipidemia   . Heart murmur   . History of hiatal hernia   . GERD (gastroesophageal reflux disease)   . Chronic lower back pain   . RBBB   . Shortness of breath dyspnea   . History of pneumonia   . Hx of gallstones   . Sinus headache   . Migraine     "not very often at all" (07/13/2014)    Medications:  Infusions:  . sodium chloride    . sodium chloride    . amiodarone 30 mg/hr (03/31/15 1900)  . dextrose 5 % and 0.9% NaCl 50 mL/hr at 2015/04/15 0705  . DOPamine 5 mcg/kg/min (04/15/15 0600)  . fentaNYL infusion INTRAVENOUS    . lactated ringers Stopped (03/08/2015 2200)  . lactated ringers 20 mL/hr (03/28/15 0310)  . milrinone Stopped (03/30/15 1848)  . norepinephrine (LEVOPHED) Adult infusion 50 mcg/min (04/15/15 0530)  . phenylephrine (NEO-SYNEPHRINE) Adult infusion 200 mcg/min (04/15/2015 0400)  . vasopressin (PITRESSIN) infusion - *FOR SHOCK* 0.03 Units/min (04/15/15 0257)    Assessment: 64 yo male on warfarin PTA for hx of VTE, AFib, mechanical AVR. He is s/p CABG and AVR  on 7/25. Yesterday pt became septic, required intubation, and abdominal ex-lap which showed a perforated sigmoid colon.  Immediately prior to ex-lap INR resulted at 6.7.  Due to immediate need for surgery, pt received FFP and Kcentra. H/h 12.4/37, plts 128. SCr 1 at baseline, up to 2.78, eCrCl ~71ml/min.  Goal of Therapy:  Heparin level 0.3-0.5 units/ml Monitor platelets by anticoagulation protocol: Yes   Plan:  -Heparin 1250 units/hr -- aim for low goal given recent surgeries -Daily HL, CBC -Monitor s/sx bleeding -Check confirmatory level this evening   Hughes Better, PharmD, BCPS Clinical Pharmacist Pager: [phone] April 30, 2015 8:00 AM

## 2015-04-03 NOTE — Anesthesia Preprocedure Evaluation (Addendum)
Anesthesia Evaluation  Patient identified by MRN, date of birth, ID band Patient awake    Reviewed: Allergy & Precautions, Unable to perform ROS - Chart review onlyPreop documentation limited or incomplete due to emergent nature of procedure.  Airway Mallampati: Intubated       Dental   Pulmonary COPDformer smoker,  breath sounds clear to auscultation        Cardiovascular hypertension, + CAD (CABG with AV replacement 03/16/2015) + dysrhythmias Atrial Fibrillation Rhythm:Irregular Rate:Normal     Neuro/Psych    GI/Hepatic hiatal hernia, GERD-  ,  Endo/Other  Morbid obesity  Renal/GU      Musculoskeletal   Abdominal   Peds  Hematology   Anesthesia Other Findings Pt to OR already intubated, BBS on vent.  Reproductive/Obstetrics                            Anesthesia Physical Anesthesia Plan  ASA: IV and emergent  Anesthesia Plan: General   Post-op Pain Management:    Induction: Intravenous  Airway Management Planned:   Additional Equipment:   Intra-op Plan:   Post-operative Plan: Post-operative intubation/ventilation  Informed Consent:   History available from chart only  Plan Discussed with: CRNA, Anesthesiologist and Surgeon  Anesthesia Plan Comments: (Pt arrived with 3 lumen CVC in Left IJ , arterial line in L radial, and PIC in R arm.)        Anesthesia Quick Evaluation

## 2015-04-03 NOTE — Progress Notes (Signed)
   2015/04/04 1400  Clinical Encounter Type  Visited With Patient;Health care provider  Visit Type Code;Critical Care   Chaplain was paged to patient's room for a code blue. Medical team was working with patient when chaplain arrived. No family was present. No chaplain support needed at this time. Page Elodia Florence chaplain if further support needed this weekend.  Miriam Kestler, Claudius Sis, Chaplain  2:25 PM

## 2015-04-03 NOTE — Progress Notes (Signed)
Patients systolic BP continues to drop into 60-70s. Patient currently on Neo-Synephrine@200 , Levophed@50 , Vasopressin@0 .03, Dopamine@5 . Patients O2 saturations also ranging anywhere from the 70s-90s on 100% FiO2. ABG with pH 7.18, pCO2 30, pO2 66, and sO2 86%. Patient with worsening mottling. No urine output. Dr. Lake Bells and Dr. Roxan Hockey made aware of situation. Epinephrine drip and 2 amps of bicarb ordered by Dr. Lake Bells. Son, Ysidro Evert, called and updated via phone and asked to come in to meet with Dr. Lake Bells. Will start Epi gtt STAT and continue to monitor patient.  Vena Austria., RN 04-21-2015 1000

## 2015-04-03 NOTE — Progress Notes (Addendum)
Initial Nutrition Assessment  DOCUMENTATION CODES:  Non-severe (moderate) malnutrition in context of acute illness/injury   Pt meets criteria for Moderate MALNUTRITION in the context of Acute illness as evidenced by an intake of <50% of needs for >5 days and mild fluid accumulation.  INTERVENTION:   Pt at high nutritional risk and has not had nutritional intake for >5 days. If EN not feasible, recommend initiation of PN (see estimated needs below)  If/when  medically able recommend TF> PN.    Initiate VHP @ 20 ml/hr via OGT and increase by 10 ml every 4 hours to goal rate of 45 ml/hr.   60 ml Prostat BID   Tube feeding regimen provides 1480 kcal (95% of needs), 155 grams of protein, and 902 ml of H2O.    NUTRITION DIAGNOSIS:  Inadequate oral intake related to inability to eat as evidenced by NPO status x 5 days  GOAL:  Provide needs based on ASPEN/SCCM guidelines  MONITOR:  Diet advancement, Vent status, TF tolerance, Skin, Labs, Weight trends, I & O's  REASON FOR ASSESSMENT:  Ventilator    ASSESSMENT:  64 y/o PMHX COPD, HTN, CAD, GERD who is POD 5 CABG x3 + aortic valve replacement. Post op recovery complicated. Intubated for Resp Failure. NG tube for suction.  7/29 TPN was ordered by MD. Was to be started on 7/30. On 7/30 CT shows free intraperitoneal air concerning for bowel perforation s/p emergent Ex lap.  Now severe Shock w/ abdomen VAC in place and probable PNA  Pt on multiple pressors. Nurse stated MD likely to proceed with TPN.   Patient is currently intubated on ventilator support MV: 20.7 L/min Temp (24hrs), Avg:101.9 F (38.8 C), Min:97.3 F (36.3 C), Max:103.7 F (39.8 C)  Diet Order:  Diet NPO time specified .TPN (CLINIMIX-E) Adult  Skin:  Incision on Leg, thigh, sternum, abdomen (VAC in place)  Last BM:  7/29  Height:  Ht Readings from Last 1 Encounters:  03/21/2015 5\' 11"  (1.803 m)   Weight:  Wt Readings from Last 1 Encounters:  03/31/15 244 lb  0.8 oz (110.7 kg)   Wt Readings from Last 10 Encounters:  03/31/15 244 lb 0.8 oz (110.7 kg)  03/23/15 252 lb 9 oz (114.562 kg)  02/22/15 252 lb (114.306 kg)  02/14/15 246 lb (111.585 kg)  02/02/15 252 lb (114.306 kg)  01/20/15 252 lb (114.306 kg)  12/19/14 248 lb 1.9 oz (112.546 kg)  08/01/14 247 lb (112.038 kg)  07/13/14 245 lb 12.8 oz (111.494 kg)  06/15/14 252 lb 12.8 oz (114.669 kg)   Ideal Body Weight:  78.2 kg  BMI:  Body mass index is 34.05 kg/(m^2).  Estimated Nutritional Needs:  Kcal:  1218-1550 kcals (11-14 kcal/kg) Protein:  156 g Pro (2 g/kg IBW) Fluid:  PER MD  EDUCATION NEEDS:  No education needs identified at this time  Burtis Junes RD, LDN Nutrition Pager: [phone] 04/17/15 11:01 AM

## 2015-04-03 NOTE — Progress Notes (Signed)
I will be fine to restart Heparin at about 1:00 pm without a bolus.  Kathryne Eriksson. Dahlia Bailiff, MD, [place] [phone] [facility] Surgery

## 2015-04-03 NOTE — Progress Notes (Signed)
CRITICAL VALUE ALERT  Critical value received:  Ca++= 6.4  Date of notification:  T  Time of notification:  4068  Critical value read back:Yes.    Nurse who received alert:  Hettie Holstein  MD notified (1st page):  Dr Rosendo Gros  Time of first page: At bedside  MD notified (2nd page):  Time of second page:  Responding MD: Dr Rosendo Gros   Time MD responded: At bedside

## 2015-04-03 NOTE — Progress Notes (Signed)
At approximately 1400, pt's BP began to rapidly decline despite being on multiple maxed out doses of pressors. MD Roxan Hockey paged. At 1404, pt's blood pressure dropped to a systolic of 20- while the HR on the monitor remained afib in the 70's. Attempted to find femoral pulse, without success. Pt was in PEA and a code blue was initiated. See code blue flowsheets. Pt's son was phoned to return to bedside during this event.  ROSC was achieved. MD hendrickson met and discussed prognosis with son, and decision was made to make a DNR- but to maintain current therapies.   CRRT was initiated shortly before the code and the filter subsequently clotted. VO received not to change filter.   Lorre Munroe

## 2015-04-03 NOTE — Progress Notes (Signed)
Hypoglycemic Event  CBG: 13  Treatment: 1 amp Dextrose  Symptoms: clammy/diaphoretic  Follow-up CBG: [id number] CBG Result 63  Possible Reasons for Event: Sepsis  Comments/MD notified: Deterding     Leandra Kern  Remember to initiate Hypoglycemia Order Set & complete

## 2015-04-03 NOTE — Progress Notes (Signed)
Hypoglycemic Event  CBG: 58  Treatment: 1 amp Dextrose  Symptoms: clammy/diaphoretic  Follow-up CBG: Time: 2221 CBG Result:112  Possible Reasons for Event: Sepsis  Comments/MD notified: Richardean Chimera, Waldemar Dickens  Remember to initiate Hypoglycemia Order Set & complete

## 2015-04-03 NOTE — Progress Notes (Signed)
      [address]       [place] [address]             [phone]      Called by RN- preliminary report on CT abdomen showed free intraperitoneal air.  I have consulted General Surgery to evaluate  Based on NG output and this finding, I suspect he is severely intravascularly volume depleted and have ordrered NS at 500 ml/hr in addition to the volume from his drips.  Revonda Standard Roxan Hockey, MD [facility] [phone]

## 2015-04-03 NOTE — Anesthesia Postprocedure Evaluation (Signed)
  Anesthesia Post-op Note  Patient: Chris Reyes  Procedure(s) Performed: Procedure(s): EXPLORATORY LAPAROTOMY, SIGMOID RESECTION (N/A)  Patient Location: SICU  Anesthesia Type:General  Level of Consciousness: sedated and Patient remains intubated per anesthesia plan  Airway and Oxygen Therapy: Patient remains intubated per anesthesia plan and Patient placed on Ventilator (see vital sign flow sheet for setting)  Post-op Pain: none  Post-op Assessment: Post-op Vital signs reviewed, Patient's Cardiovascular Status Stable, Respiratory Function Stable and Pain level controlled              Post-op Vital Signs: Reviewed  Last Vitals:  Filed Vitals:   25-Apr-2015 0600  BP: 118/83  Pulse: 117  Temp: 38.2 C  Resp: 35    Complications: No apparent anesthesia complications

## 2015-04-03 NOTE — Progress Notes (Signed)
eLink Physician-Brief Progress Note Patient Name: Chris Reyes DOB: [birth year] MRN: [id number]   Date of Service  04/22/15  HPI/Events of Note  Hypoglycemia - has received 2 amps of D50  eICU Interventions  Plan: Change IVFs to D5NS at 50 cc/hr Continue to monitor blood sugar     Intervention Category Intermediate Interventions: Other:  DETERDING,ELIZABETH April 22, 2015, 7:01 AM

## 2015-04-03 NOTE — Progress Notes (Addendum)
Dr. Roxan Hockey notified of results of CT Scan of Abdomen.  States he will call Dr. Rosendo Gros who is on call MD for [facility].  Order received for 1 liter of NS bolus to be given.

## 2015-04-03 NOTE — Progress Notes (Addendum)
Hypoglycemic Event  CBG: 48  Treatment: D50 IV 50 mL  Symptoms: Pale and Sweaty  Follow-up CBG: [id number] CBG Result:111  Possible Reasons for Event: Other: sepsis   Chris Reyes  Remember to initiate Hypoglycemia Order Set & complete

## 2015-04-03 NOTE — Procedures (Signed)
CENTRAL VENOUS CATHETER INSERTION   Indication: Shock Consent: yes Time out: yes Appropriate position: yes Hand washing: yes Patient Sterilized and Draped: yes Location: Left IJ # of Attempts: 1 Ultrasound Guidance: yes Wire Confirmed with [place]: yes Insertion depth: 20 cm All Ports Draw and flush: yes CXR:   Pneumothorax: no  Line position appropriate: yes Line sutured in place: yes EBL: 3 cc Complications: no Patient Tolerated Procedure Well: yes     Meribeth Mattes, DO., MS [facility]

## 2015-04-03 NOTE — Progress Notes (Signed)
Pt passed away at [place], son present at time of passing. MD Roxan Hockey made aware. Achille Rich and myself verified absence of heart tones. CDS phoned- see death check list  Lorre Munroe

## 2015-04-03 NOTE — Progress Notes (Signed)
RT note- patient was taken off ventilator for CPR/PEA, bagged with 100% fio2, placed back to full support at current settings.

## 2015-04-03 NOTE — Progress Notes (Signed)
Patient passed away while on the ventilator. RT extubated patient at 1830.

## 2015-04-03 NOTE — Consult Note (Signed)
Renal Service Consult Note [facility]  Chris Reyes 2015/04/12 Sol Blazing Requesting Physician:  Dr Lake Bells  Reason for Consult:  Acute renal failure in patient with perforated colon complicating heart surgery earlier this week HPI: The patient is a 64 y.o. year-old with hx of HTN, COPD, CAD, PAD, AS, PE, tobacco use who was admitted on 7/26 after undergoing elective AoV replacement for severe AS (tissue valve) along with CABG x 3.  He had been having progressive symptoms of severe AS including DOE.  Heart cath showed severe CAD with 99% RCA, sig OM disease x 1 and 75% disease of a large diagonal.   Post op was doing well post op. On 7/27 pm developed afib and was rx'd with amio protocol. He had some nausea then on 7/28 which was better on 7/29.  Then yest afternoon he went into resp distress and was vomiting/ diaphoretic. Had some lower abd pain.  Required intubation then pressor support. CT showed free air and patient went to OR last note which showed perforated sigmoid colon. Pt underwent sigmoid resection and washout with VAC placement. Now is on 4-5 pressors max doses, mottled , CVP 9 and arterial pH 7.18 on ABG.  Asked to see for ARF and met acidosis.    Past Medical History  Past Medical History  Diagnosis Date  . Hypertension   . Hernia   . COPD (chronic obstructive pulmonary disease)   . CAD (coronary artery disease)     Catheterization, [place], July, 2006, EF 45%,  50% LAD, 50% RCA, 90% marginal, possible collaterals from LAD  to a branch of the PDA, medical therapy recommended  . PAD (peripheral artery disease)     Arterial leg Dopplers, April, 2014, normal  . Ejection fraction     EF 45%, catheterization, 2006  //   EF 55%, echo, March, 2012, wall abnormality at the base of the inferior wall  . Aortic stenosis     severe echo 01/2013- cannot rule out bicuspid aortic valve  //   moderately severe, echo,  March, 2012 ( progression since 2009)  .  Pulmonary emboli 2007    Significant, 2007, Coumadin therapy  . Warfarin anticoagulation     History pulmonary emboli  . Tobacco abuse   . Carotid artery disease     Doppler, March, 2012,  0-39% bilateral  . Dyslipidemia   . Heart murmur   . History of hiatal hernia   . GERD (gastroesophageal reflux disease)   . Chronic lower back pain   . RBBB   . Shortness of breath dyspnea   . History of pneumonia   . Hx of gallstones   . Sinus headache   . Migraine     "not very often at all" (07/13/2014)   Past Surgical History  Past Surgical History  Procedure Laterality Date  . Cholecystectomy  1998  . Tonsillectomy    . Carpal tunnel release Left ~ 1994  . Lumbar disc surgery  1988; 1990  . Posterior lumbar fusion  2006  . Cardiac catheterization N/A 02/14/2015    Procedure: Right/Left Heart Cath and Coronary Angiography;  Surgeon: Belva Crome, MD;  Location: [place];  Service: Cardiovascular;  Laterality: N/A;  . Back surgery  1988, 1990  . Aortic valve replacement N/A 03/30/2015    Procedure: AORTIC VALVE REPLACEMENT (AVR);  Surgeon: Ivin Poot, MD;  Location: [facility];  Service: Open Heart Surgery;  Laterality: N/A;  . Coronary artery bypass  graft N/A 03/22/2015    Procedure: CORONARY ARTERY BYPASS GRAFTING (CABG), ON PUMP, TIMES THREE, USING LEFT INTERNAL MAMMARY ARTERY, RIGHT GREATER SAPHENOUS VEIN HARVESTED ENDOSCOPICALLY;  Surgeon: Ivin Poot, MD;  Location: [facility];  Service: Open Heart Surgery;  Laterality: N/A;  . Tee without cardioversion N/A 03/31/2015    Procedure: TRANSESOPHAGEAL ECHOCARDIOGRAM (TEE);  Surgeon: Ivin Poot, MD;  Location: [place];  Service: Open Heart Surgery;  Laterality: N/A;  . Central line  April 06, 2015        Family History  Family History  Problem Relation Age of Onset  . Diabetes Father   . Cancer Father     large cell lung cancer  . Heart disease Mother    Social History  reports that he quit smoking about 8 months ago. His  smoking use included Cigarettes. He has a 10 pack-year smoking history. He has never used smokeless tobacco. He reports that he drinks alcohol. He reports that he does not use illicit drugs. Allergies No Known Allergies Home medications Prior to Admission medications   Medication Sig Start Date End Date Taking? Authorizing Provider  aspirin EC 81 MG EC tablet Take 1 tablet (81 mg total) by mouth daily. 04/13/14  Yes Geradine Girt, DO  Coenzyme Q10 (COQ10) 100 MG CAPS Take 1 capsule by mouth daily.    Yes Historical Provider, MD  fish oil-omega-3 fatty acids 1000 MG capsule Take 1 g by mouth daily.   Yes Historical Provider, MD  fluticasone (FLONASE) 50 MCG/ACT nasal spray Place 1 spray into both nostrils daily. 08/30/13  Yes Rowe Clack, MD  isosorbide mononitrate (IMDUR) 30 MG 24 hr tablet Take 30 mg by mouth daily.   Yes Historical Provider, MD  losartan (COZAAR) 50 MG tablet TAKE 1 TABLET (50 MG TOTAL) BY MOUTH DAILY. 02/22/15  Yes Olga Millers, MD  ibuprofen (ADVIL,MOTRIN) 200 MG tablet Take 600 mg by mouth every 6 (six) hours as needed (pain).    Historical Provider, MD  warfarin (COUMADIN) 5 MG tablet TAKE AS DIRECTED BY ANTICOAGULATION CLINIC Patient not taking: Reported on 03/22/2015 02/20/15   Rowe Clack, MD   Liver Function Tests  Recent Labs Lab 03/31/15 0215 April 06, 2015 0037 2015-04-06 0653  AST 26 49* 134*  ALT 20 24 74*  ALKPHOS 41 35* 36*  BILITOT 1.4* 1.8* 2.0*  PROT 7.0 4.2* 4.2*  ALBUMIN 2.6* 1.7* 1.8*   No results for input(s): LIPASE, AMYLASE in the last 168 hours. CBC  Recent Labs Lab 03/31/15 0215  04/06/2015 0037  04-06-2015 0328 06-Apr-2015 0423 04-06-2015 0653  WBC 9.5  --  8.3  --   --   --  13.0*  NEUTROABS  --   --  6.7  --   --   --   --   HGB 16.1  < > 14.1  < > 12.9* 12.2* 12.4*  HCT 46.0  < > 40.4  < > 38.0* 36.0* 37.0*  MCV 93.5  --  93.7  --   --   --  94.4  PLT 203  --  156  --   --   --  128*  < > = values in this interval not  displayed. Basic Metabolic Panel  Recent Labs Lab 03/28/15 0430  03/29/15 6004  03/30/15 0420 03/30/15 1846 03/31/15 0215 03/31/15 1753 2015/04/06 0037 06-Apr-2015 5997 2015-04-06 0328 2015/04/06 0423 04-06-2015 0653  NA 135  < > 130*  < > 132* 130* 132* 132* 136 135  137 138 138  K 3.7  < > 4.4  < > 4.0 3.7 3.9 3.5 3.3* 3.3* 3.0* 3.3* 3.4*  CL 105  < > 99*  < > 96* 94* 95* 100* 98* 95*  --   --  100*  CO2 24  --  24  --  $R'29 25 26  'bJ$ --  22  --   --   --  17*  GLUCOSE 143*  < > 130*  < > 130* 142* 135* 82 188* 193*  --   --  63*  BUN 11  < > 14  < > 22* 31* 36* 54* 62* 54*  --   --  59*  CREATININE 0.73  < > 0.88  < > 0.93 1.04 1.06 1.70* 2.42* 2.40*  --   --  2.78*  CALCIUM 7.8*  --  7.8*  --  8.4* 8.4* 8.8*  --  7.0*  --   --   --  6.6*  PHOS  --   --   --   --   --   --   --   --  4.0  --   --   --   --   < > = values in this interval not displayed.  Filed Vitals:   2015-05-01 0930 May 01, 2015 0945 05/01/15 1000 2015/05/01 1015  BP: 92/66  82/46 96/48  Pulse: 105 70 90   Temp: 101.4 F (38.6 C) 101.5 F (38.6 C) 101.4 F (38.6 C) 101.4 F (38.6 C)  TempSrc:      Resp: 35 35 35 35  Height:      Weight:      SpO2: 65% 70% 100%    Exam On vent, unresponsive, mottled diffusely Sclera anicteric, throat w ETT in place No jvd, CVP 9 Chest clear ant and lat RRR tachy no RG Abd obese, lower mid abdomen midline open wound w VAC in place, dec'd BS GU foley cath with brown urine small amounts Ext diffuse mottling, trace pitting edema LE's otherwise no edema Neuro is unresponsive  7/21 UA negative Na 138  K 3.4  CO2 17  Creat 2.78  BUN 59   Ca 6.6  Alb 1.8  Adj Ca 8.4  WBC 13k  Hb 12 Assessment: 1. Acute renal failure/ AKI due to ATN from septic shock in setting of colon perforation 2. Shock /hypotension - getting fluids as maxed out on pressors now 3. Met acidosis - moderately severe 4. Perf colon, s/p exlap with repair 5. S/P tissue AVR + 3VCABG on 7/26 6. Afib on IV  amio 7. VDRF   Plan- CRRT , keep +100 cc/ hr for now, no heparin  Kelly Splinter MD (pgr) [phone]    ([phone] 05/01/15, 10:49 AM

## 2015-04-03 NOTE — Op Note (Addendum)
April 27, 2015  4:50 AM  PATIENT:  Chris Reyes  64 y.o. male  PRE-OPERATIVE DIAGNOSIS: Perforated viscus  POST-OPERATIVE DIAGNOSIS: Perforated sigmoid colon  PROCEDURE:  Procedure(s): EXPLORATORY LAPAROTOMY, SIGMOID RESECTION, ABDOMINAL VAC PLACEMENT, OPEN ABDOMEN (N/A)  SURGEON:  Surgeon(s) and Role:    * Ralene Ok, MD - Primary    * Alphonsa Overall, MD - Assisting  ANESTHESIA:   general  EBL: 50cc  Total I/O In: 4496.7 [I.V.:2914.7; Blood:1082; IV [id number]:569] Out: 32 [Urine:115; Emesis/NG output:800]  BLOOD ADMINISTERED:none  DRAINS: abdominal wound vac left in place   LOCAL MEDICATIONS USED:  NONE  SPECIMEN:  Source of Specimen:  Sigmoid colon  DISPOSITION OF SPECIMEN:  PATHOLOGY  COUNTS:  YES  TOURNIQUET:  * No tourniquets in log *  DICTATION: .Dragon Dictation Complications: none  Counts: reported as correct x 2   Indications for procedure: Pt is a 64 y/o M s/p AVR and CABGx4.  Pt with perforated viscus on CT scan.  Findings: Pt had a single anterior perforation of his sigmoid colon. There was a large amount of stool spillage within the abdomen.  The patient was maxed out out pressors x 3 preoperatively and throughout the case.  The patient's midportion of the jejunum did appear bruised likely secondary to retraction. The left colon also appeared hemorrhagic however was not ischemic. The descending colon stump was placed to the left portion of the abdomen, there is minimal bleeding at the staple line.  The remaining portion of the small bowel right transverse colon appeared to be viable.  The patient was left in discontinuity with plans for a second look operation in 24-48 hours.  Details of the procedure:The patient was taken back to the operating room. The patient was placed in supine position with bilateral SCDs in place.  The patient was prepped and draped in the usual sterile fashion. After appropriate anitbiotics were confirmed, a time-out was confirmed  and all facts were verified.  A midline incision was made using the 10 blade. Electrocautery was used to maintain hemostasis and dissection was taken down to the anterior fascia. The peritoneum was entered bluntly as was elevated between 2 Kocher clamps.  there is large amount of stool contamination within the abdomen. The patient did have a preoperative umbilical hernia with what appeared to be incarcerated omentum. The fascia was then incised inferiorly up to the hernia. The hernia contents were reduced and appeared to be containing only congested omentum. This was ligated and discarded. I proceeded to extend the incision inferiorly. The omentum was elevated and retracted superiorly. There is large amount of stool contamination within the pelvis and the left colon. This was suctioned out. The small bowel did not appear ischemic.   At this time I turned my attention to the sigmoid colon. Upon visualizing the anterior wall did appear to be a single perforation on the anterior portion of the sigmoid colon. At this time a 2-0 silk were used in a figure-of-eight fashion to help contain the stool contamination. A Bookwalter retractor was then placed to help with abdominal wall retraction. Small bowel was retracted cephalad with a wet towel. Secondary to the thickness in adhesions of the pelvis the proximal portion of the left colon was initially transected. This was done by creating a window on the mesenteric side of the left colon sigmoid junction. A 75 GIA stapler, blue load was used to transect the colon. At this time the what I have told was incised to help elevate the sigmoid colon.  The sigmoid colon was redundant and had a long mesentery. A LigaSure device was used to ligate the mesentery of the sigmoid colon. This was taken down to just past perforation. Circumferentially the sigmoid colon was dissected away. Secondary to the thickness of the sigmoid colon the fat around the sigmoid colon was dissected away  to help with transection of the colon with a stapling device.  At this time a TA 90 blue load stapler was placed across the area distal to the perforation. The stapler was fired. The proximal portion of the staple line was cut with a scalpel. Upon visualizing the staple line  It did appear that the stapler did misfire. No staples were Fired. At this time 2-0 silk's were used to close off the rectal stump. 2-0 Prolene were used to tack the rectal stump. The specimen was sent to pathology.  At this time we proceeded to irrigate out the abdomen with several liters of saline. We reexamined the small bowel from ligament of Treitz to the terminal ileum and the only area of concern was at the mid jejunal area which appeared to be some congestion secondary to retraction. The cecum, right colon, transverse colon up to the splenic flexure appeared normal. The descending portion of the left colon appeared to be hemorrhagic. It was not overtly ischemic. The staple line of the left colon was visualized and did not appear to be bleeding or oozing.  At this time the omentum was brought over the small bowel and a blue abdominal wound VAC was placed. The sponge was stapled to the margin of the skin. Suction was tested prior to leaving the OR and was excellent.  The patient was taken to the ICU intubated and in critical condition.    PLAN OF CARE: Admit to inpatient   PATIENT DISPOSITION:  PACU - hemodynamically stable.   Delay start of Pharmacological VTE agent (>24hrs) due to surgical blood loss or risk of bleeding: not applicable

## 2015-04-03 NOTE — Progress Notes (Signed)
CRITICAL VALUE ALERT  Critical value received:  Lactic acid 7.6 Date of notification:  2015/04/05  Time of notification:  0100  Critical value read back: yes  Nurse who received alert:  Beckie Salts, RN MD notified (1st page): Ramirez/Dr. Deterding  Time of first page:  0115  MD notified (2nd page):  Time of second page:  Responding MD:  Ramirez/Dr. Jimmy Footman  Time MD responded:  [phone]

## 2015-04-03 NOTE — Progress Notes (Signed)
Day of Surgery Procedure(s) (LRB): EXPLORATORY LAPAROTOMY, SIGMOID RESECTION (N/A) Subjective: Findings at laparotomy d/w Dr. Rosendo Gros Intubated, sedated  Objective: Vital signs in last 24 hours: Temp:  [97.2 F (36.2 C)-103.7 F (39.8 C)] 101.1 F (38.4 C) (07/30 0715) Pulse Rate:  [25-129] 108 (07/30 0715) Cardiac Rhythm:  [-] Atrial fibrillation (07/30 0600) Resp:  [22-48] 35 (07/30 0715) BP: (76-172)/(15-105) 125/80 mmHg (07/30 0700) SpO2:  [83 %-100 %] 100 % (07/30 0715) Arterial Line BP: (43-141)/(28-75) 104/57 mmHg (07/30 0715) FiO2 (%):  [50 %-100 %] 100 % (07/30 0515)  Hemodynamic parameters for last 24 hours: CVP:  [7 mmHg-18 mmHg] 7 mmHg  Intake/Output from previous day: 07/29 0701 - 07/30 0700 In: 5928.5 [P.O.:240; I.V.:3346.5; Blood:1082; NG/GT:60; IV Piggyback:1200] Out: 5155 [Urine:1055; Emesis/NG output:4100] Intake/Output this shift:    Neurologic: sedated Heart: irregularly irregular rhythm Lungs: wheezes bilaterally Abdomen: VAC in place  Lab Results:  Recent Labs  Apr 27, 2015 0037  04/27/15 0423 Apr 27, 2015 0653  WBC 8.3  --   --  13.0*  HGB 14.1  < > 12.2* 12.4*  HCT 40.4  < > 36.0* 37.0*  PLT 156  --   --  128*  < > = values in this interval not displayed. BMET:  Recent Labs  04/27/15 0037 04/27/15 0043  04/27/2015 0423 04/27/2015 0653  NA 136 135  < > 138 138  K 3.3* 3.3*  < > 3.3* 3.4*  CL 98* 95*  --   --  100*  CO2 22  --   --   --  17*  GLUCOSE 188* 193*  --   --  63*  BUN 62* 54*  --   --  59*  CREATININE 2.42* 2.40*  --   --  2.78*  CALCIUM 7.0*  --   --   --  6.6*  < > = values in this interval not displayed.  PT/INR:  Recent Labs  27-Apr-2015 0330  LABPROT 17.1*  INR 1.39   ABG    Component Value Date/Time   PHART 7.315* 04-27-15 0423   HCO3 20.4 2015/04/27 0423   TCO2 22 04-27-15 0423   ACIDBASEDEF 5.0* 04/27/2015 0423   O2SAT 89.0 04-27-2015 0423   CBG (last 3)   Recent Labs  04-27-15 0030 04-27-15 0622  2015-04-27 0650  GLUCAP 47* 13* 63*    Assessment/Plan: S/P Procedure(s) (LRB): EXPLORATORY LAPAROTOMY, SIGMOID RESECTION (N/A) Critically ill s/p ex lap for perforated sigmoid colon   CV- still in a fib - on amiodarone  Hypotension due to septic shock, still on multiple pressors to maintain BP  Coumadin reversed for surgery last night- will restart heparin drip when safe  RESP- VDRF, probable aspiration pneumonia, not in a position to consider weaning  Vent per CCM  RENAL- acute renal failure secondary to sepsis- creatinine 2.78, hypokalemia, metabolic acidosis  ENDO- profound hypoglycemia- levemir dc'ed  D5 addded to IVF  GI- perforated sigmoid, abdomen open with VAC in place  Will need to go back to OR in next few days when stable enough  ID- continue vanco/ zosyn  Critically ill with guarded prognosis   LOS: 5 days    Chris Reyes 04/27/2015

## 2015-04-03 NOTE — Consult Note (Signed)
Reason for Consult:Free air, sepsis Referring Physician: Dr. [name] is an 64 y.o. male.  HPI: Pt is a 64 y/o M POD # 4 from AVR and CABG x 3.  Pt with a medical h/o afib, currently with a therapeutic INR 2.09, COPD, HTN, and PAD.    Pt with recent septic picture this afternoon requiring intubation, and pressors x 3, currently maxed out on his 3 pressors.  Secondary to abdominal distension and dyspnea, pt underwent CT of his abdomen and pelvis.  CT revealed free air within his abdomen and an umbilical hernia, concerning portion of midsigmoid colon thickened and possible site for perforation.  Past Medical History  Diagnosis Date  . Hypertension   . Hernia   . COPD (chronic obstructive pulmonary disease)   . CAD (coronary artery disease)     Catheterization, [place], July, 2006, EF 45%,  50% LAD, 50% RCA, 90% marginal, possible collaterals from LAD  to a branch of the PDA, medical therapy recommended  . PAD (peripheral artery disease)     Arterial leg Dopplers, April, 2014, normal  . Ejection fraction     EF 45%, catheterization, 2006  //   EF 55%, echo, March, 2012, wall abnormality at the base of the inferior wall  . Aortic stenosis     severe echo 01/2013- cannot rule out bicuspid aortic valve  //   moderately severe, echo,  March, 2012 ( progression since 2009)  . Pulmonary emboli 2007    Significant, 2007, Coumadin therapy  . Warfarin anticoagulation     History pulmonary emboli  . Tobacco abuse   . Carotid artery disease     Doppler, March, 2012,  0-39% bilateral  . Dyslipidemia   . Heart murmur   . History of hiatal hernia   . GERD (gastroesophageal reflux disease)   . Chronic lower back pain   . RBBB   . Shortness of breath dyspnea   . History of pneumonia   . Hx of gallstones   . Sinus headache   . Migraine     "not very often at all" (07/13/2014)    Past Surgical History  Procedure Laterality Date  . Cholecystectomy  1998  . Tonsillectomy     . Carpal tunnel release Left ~ 1994  . Lumbar disc surgery  1988; 1990  . Posterior lumbar fusion  2006  . Cardiac catheterization N/A 02/14/2015    Procedure: Right/Left Heart Cath and Coronary Angiography;  Surgeon: Chris Crome, MD;  Location: [facility];  Service: Cardiovascular;  Laterality: N/A;  . Back surgery  1988, 1990  . Aortic valve replacement N/A 03/21/2015    Procedure: AORTIC VALVE REPLACEMENT (AVR);  Surgeon: Chris Poot, MD;  Location: [facility];  Service: Open Heart Surgery;  Laterality: N/A;  . Coronary artery bypass graft N/A 03/28/2015    Procedure: CORONARY ARTERY BYPASS GRAFTING (CABG), ON PUMP, TIMES THREE, USING LEFT INTERNAL MAMMARY ARTERY, RIGHT GREATER SAPHENOUS VEIN HARVESTED ENDOSCOPICALLY;  Surgeon: Chris Poot, MD;  Location: [facility];  Service: Open Heart Surgery;  Laterality: N/A;  . Tee without cardioversion N/A 03/22/2015    Procedure: TRANSESOPHAGEAL ECHOCARDIOGRAM (TEE);  Surgeon: Chris Poot, MD;  Location: [facility];  Service: Open Heart Surgery;  Laterality: N/A;    Family History  Problem Relation Age of Onset  . Diabetes Father   . Cancer Father     large cell lung cancer  . Heart disease Mother  Social History:  reports that he quit smoking about 8 months ago. His smoking use included Cigarettes. He has a 10 pack-year smoking history. He has never used smokeless tobacco. He reports that he drinks alcohol. He reports that he does not use illicit drugs.  Allergies: No Known Allergies  Medications: I have reviewed the patient's current medications.  Results for orders placed or performed during the hospital encounter of 03/30/2015 (from the past 48 hour(s))  Glucose, capillary     Status: Abnormal   Collection Time: 03/30/15  4:16 AM  Result Value Ref Range   Glucose-Capillary 126 (H) 65 - 99 mg/dL   Comment 1 Venous Specimen   Protime-INR     Status: Abnormal   Collection Time: 03/30/15  4:20 AM  Result Value Ref Range    Prothrombin Time 18.4 (H) 11.6 - 15.2 seconds   INR 1.52 (H) 0.00 - 1.49  Comprehensive metabolic panel     Status: Abnormal   Collection Time: 03/30/15  4:20 AM  Result Value Ref Range   Sodium 132 (L) 135 - 145 mmol/L   Potassium 4.0 3.5 - 5.1 mmol/L   Chloride 96 (L) 101 - 111 mmol/L   CO2 29 22 - 32 mmol/L   Glucose, Bld 130 (H) 65 - 99 mg/dL   BUN 22 (H) 6 - 20 mg/dL   Creatinine, Ser 0.93 0.61 - 1.24 mg/dL   Calcium 8.4 (L) 8.9 - 10.3 mg/dL   Total Protein 5.8 (L) 6.5 - 8.1 g/dL   Albumin 2.6 (L) 3.5 - 5.0 g/dL   AST 32 15 - 41 U/L   ALT 19 17 - 63 U/L   Alkaline Phosphatase 42 38 - 126 U/L   Total Bilirubin 1.5 (H) 0.3 - 1.2 mg/dL   GFR calc non Af Amer >60 >60 mL/min   GFR calc Af Amer >60 >60 mL/min    Comment: (NOTE) The eGFR has been calculated using the CKD EPI equation. This calculation has not been validated in all clinical situations. eGFR's persistently <60 mL/min signify possible Chronic Kidney Disease.    Anion gap 7 5 - 15  CBC     Status: Abnormal   Collection Time: 03/30/15  4:20 AM  Result Value Ref Range   WBC 15.3 (H) 4.0 - 10.5 K/uL   RBC 4.57 4.22 - 5.81 MIL/uL   Hemoglobin 15.0 13.0 - 17.0 g/dL   HCT 43.0 39.0 - 52.0 %   MCV 94.1 78.0 - 100.0 fL   MCH 32.8 26.0 - 34.0 pg   MCHC 34.9 30.0 - 36.0 g/dL   RDW 13.4 11.5 - 15.5 %   Platelets 124 (L) 150 - 400 K/uL  Magnesium     Status: None   Collection Time: 03/30/15  4:20 AM  Result Value Ref Range   Magnesium 2.4 1.7 - 2.4 mg/dL  Glucose, capillary     Status: Abnormal   Collection Time: 03/30/15  9:00 AM  Result Value Ref Range   Glucose-Capillary 145 (H) 65 - 99 mg/dL  Glucose, capillary     Status: Abnormal   Collection Time: 03/30/15 12:38 PM  Result Value Ref Range   Glucose-Capillary 152 (H) 65 - 99 mg/dL  Glucose, capillary     Status: Abnormal   Collection Time: 03/30/15  4:18 PM  Result Value Ref Range   Glucose-Capillary 151 (H) 65 - 99 mg/dL   Comment 1 Notify RN   Basic  metabolic panel     Status: Abnormal  Collection Time: 03/30/15  6:46 PM  Result Value Ref Range   Sodium 130 (L) 135 - 145 mmol/L   Potassium 3.7 3.5 - 5.1 mmol/L   Chloride 94 (L) 101 - 111 mmol/L   CO2 25 22 - 32 mmol/L   Glucose, Bld 142 (H) 65 - 99 mg/dL   BUN 31 (H) 6 - 20 mg/dL   Creatinine, Ser 1.04 0.61 - 1.24 mg/dL   Calcium 8.4 (L) 8.9 - 10.3 mg/dL   GFR calc non Af Amer >60 >60 mL/min   GFR calc Af Amer >60 >60 mL/min    Comment: (NOTE) The eGFR has been calculated using the CKD EPI equation. This calculation has not been validated in all clinical situations. eGFR's persistently <60 mL/min signify possible Chronic Kidney Disease.    Anion gap 11 5 - 15  Glucose, capillary     Status: Abnormal   Collection Time: 03/30/15  9:51 PM  Result Value Ref Range   Glucose-Capillary 115 (H) 65 - 99 mg/dL   Comment 1 Capillary Specimen    Comment 2 Notify RN    Comment 3 Document in Chart   Protime-INR     Status: Abnormal   Collection Time: 03/31/15  2:15 AM  Result Value Ref Range   Prothrombin Time 23.3 (H) 11.6 - 15.2 seconds   INR 2.09 (H) 0.00 - 1.49  Comprehensive metabolic panel     Status: Abnormal   Collection Time: 03/31/15  2:15 AM  Result Value Ref Range   Sodium 132 (L) 135 - 145 mmol/L   Potassium 3.9 3.5 - 5.1 mmol/L   Chloride 95 (L) 101 - 111 mmol/L   CO2 26 22 - 32 mmol/L   Glucose, Bld 135 (H) 65 - 99 mg/dL   BUN 36 (H) 6 - 20 mg/dL   Creatinine, Ser 1.06 0.61 - 1.24 mg/dL   Calcium 8.8 (L) 8.9 - 10.3 mg/dL   Total Protein 7.0 6.5 - 8.1 g/dL   Albumin 2.6 (L) 3.5 - 5.0 g/dL   AST 26 15 - 41 U/L   ALT 20 17 - 63 U/L   Alkaline Phosphatase 41 38 - 126 U/L   Total Bilirubin 1.4 (H) 0.3 - 1.2 mg/dL   GFR calc non Af Amer >60 >60 mL/min   GFR calc Af Amer >60 >60 mL/min    Comment: (NOTE) The eGFR has been calculated using the CKD EPI equation. This calculation has not been validated in all clinical situations. eGFR's persistently <60 mL/min  signify possible Chronic Kidney Disease.    Anion gap 11 5 - 15  CBC     Status: None   Collection Time: 03/31/15  2:15 AM  Result Value Ref Range   WBC 9.5 4.0 - 10.5 K/uL   RBC 4.92 4.22 - 5.81 MIL/uL   Hemoglobin 16.1 13.0 - 17.0 g/dL   HCT 46.0 39.0 - 52.0 %   MCV 93.5 78.0 - 100.0 fL   MCH 32.7 26.0 - 34.0 pg   MCHC 35.0 30.0 - 36.0 g/dL   RDW 13.4 11.5 - 15.5 %   Platelets 203 150 - 400 K/uL  Glucose, capillary     Status: Abnormal   Collection Time: 03/31/15  8:32 AM  Result Value Ref Range   Glucose-Capillary 125 (H) 65 - 99 mg/dL   Comment 1 Capillary Specimen    Comment 2 Notify RN   Glucose, capillary     Status: Abnormal   Collection Time: 03/31/15 12:22 PM  Result Value Ref Range   Glucose-Capillary 127 (H) 65 - 99 mg/dL   Comment 1 Capillary Specimen    Comment 2 Notify RN   Glucose, capillary     Status: None   Collection Time: 03/31/15  5:36 PM  Result Value Ref Range   Glucose-Capillary 75 65 - 99 mg/dL   Comment 1 Capillary Specimen    Comment 2 Notify RN   I-STAT 3, arterial blood gas (G3+)     Status: Abnormal   Collection Time: 03/31/15  5:43 PM  Result Value Ref Range   pH, Arterial 7.451 (H) 7.350 - 7.450   pCO2 arterial 31.3 (L) 35.0 - 45.0 mmHg   pO2, Arterial 256.0 (H) 80.0 - 100.0 mmHg   Bicarbonate 21.9 20.0 - 24.0 mEq/L   TCO2 23 0 - 100 mmol/L   O2 Saturation 100.0 %   Acid-base deficit 1.0 0.0 - 2.0 mmol/L   Patient temperature 98.3 F    Collection site ARTERIAL LINE    Drawn by Nurse    Sample type ARTERIAL   I-STAT, chem 8     Status: Abnormal   Collection Time: 03/31/15  5:53 PM  Result Value Ref Range   Sodium 132 (L) 135 - 145 mmol/L   Potassium 3.5 3.5 - 5.1 mmol/L   Chloride 100 (L) 101 - 111 mmol/L   BUN 54 (H) 6 - 20 mg/dL   Creatinine, Ser 1.70 (H) 0.61 - 1.24 mg/dL   Glucose, Bld 82 65 - 99 mg/dL   Calcium, Ion 1.10 (L) 1.13 - 1.30 mmol/L   TCO2 22 0 - 100 mmol/L   Hemoglobin 19.4 (H) 13.0 - 17.0 g/dL   HCT 57.0 (H)  39.0 - 52.0 %  CG4 I-STAT (Lactic acid)     Status: Abnormal   Collection Time: 03/31/15  7:52 PM  Result Value Ref Range   Lactic Acid, Venous 4.01 (HH) 0.5 - 2.0 mmol/L   Comment NOTIFIED PHYSICIAN   Carboxyhemoglobin     Status: None   Collection Time: 03/31/15  8:00 PM  Result Value Ref Range   Total hemoglobin 16.3 13.5 - 18.0 g/dL   O2 Saturation 60.0 %   Carboxyhemoglobin 0.8 0.5 - 1.5 %   Methemoglobin 1.4 0.0 - 1.5 %  I-STAT 3, arterial blood gas (G3+)     Status: Abnormal   Collection Time: 03/31/15  8:55 PM  Result Value Ref Range   pH, Arterial 7.385 7.350 - 7.450   pCO2 arterial 30.7 (L) 35.0 - 45.0 mmHg   pO2, Arterial 61.0 (L) 80.0 - 100.0 mmHg   Bicarbonate 18.1 (L) 20.0 - 24.0 mEq/L   TCO2 19 0 - 100 mmol/L   O2 Saturation 89.0 %   Acid-base deficit 5.0 (H) 0.0 - 2.0 mmol/L   Patient temperature 101.6 F    Sample type ARTERIAL   Glucose, capillary     Status: Abnormal   Collection Time: 03/31/15  9:52 PM  Result Value Ref Range   Glucose-Capillary 58 (L) 65 - 99 mg/dL   Comment 1 Arterial Specimen   Lactic acid, plasma     Status: Abnormal   Collection Time: 03/31/15 10:00 PM  Result Value Ref Range   Lactic Acid, Venous 5.0 (HH) 0.5 - 2.0 mmol/L    Comment: REPEATED TO VERIFY CRITICAL RESULT CALLED TO, READ BACK BY AND VERIFIED WITHIrean Hong [id number] WILDERK   Glucose, capillary     Status: Abnormal   Collection Time: 03/31/15 10:21 PM  Result Value Ref Range   Glucose-Capillary 112 (H) 65 - 99 mg/dL   Comment 1 Notify RN    Comment 2 Document in Chart   Glucose, capillary     Status: None   Collection Time: 03/31/15 11:42 PM  Result Value Ref Range   Glucose-Capillary 73 65 - 99 mg/dL   Comment 1 Arterial Specimen   I-STAT 3, arterial blood gas (G3+)     Status: Abnormal   Collection Time: April 21, 2015 12:29 AM  Result Value Ref Range   pH, Arterial 7.253 (L) 7.350 - 7.450   pCO2 arterial 35.9 35.0 - 45.0 mmHg   pO2, Arterial 65.0 (L) 80.0 -  100.0 mmHg   Bicarbonate 15.4 (L) 20.0 - 24.0 mEq/L   TCO2 16 0 - 100 mmol/L   O2 Saturation 84.0 %   Acid-base deficit 10.0 (H) 0.0 - 2.0 mmol/L   Patient temperature 103.5 F    Sample type ARTERIAL   Glucose, capillary     Status: Abnormal   Collection Time: 04/21/15 12:30 AM  Result Value Ref Range   Glucose-Capillary 47 (L) 65 - 99 mg/dL   Comment 1 Notify RN    Comment 2 Document in Chart   CBC     Status: None   Collection Time: 21-Apr-2015 12:37 AM  Result Value Ref Range   WBC 8.3 4.0 - 10.5 K/uL   RBC 4.31 4.22 - 5.81 MIL/uL   Hemoglobin 14.1 13.0 - 17.0 g/dL    Comment: REPEATED TO VERIFY   HCT 40.4 39.0 - 52.0 %   MCV 93.7 78.0 - 100.0 fL   MCH 32.7 26.0 - 34.0 pg   MCHC 34.9 30.0 - 36.0 g/dL   RDW 13.7 11.5 - 15.5 %   Platelets 156 150 - 400 K/uL  Differential     Status: None (Preliminary result)   Collection Time: 2015-04-21 12:37 AM  Result Value Ref Range   Neutrophils Relative % PENDING 43 - 77 %   Neutro Abs PENDING 1.7 - 7.7 K/uL   Band Neutrophils PENDING 0 - 10 %   Lymphocytes Relative PENDING 12 - 46 %   Lymphs Abs PENDING 0.7 - 4.0 K/uL   Monocytes Relative PENDING 3 - 12 %   Monocytes Absolute PENDING 0.1 - 1.0 K/uL   Eosinophils Relative PENDING 0 - 5 %   Eosinophils Absolute PENDING 0.0 - 0.7 K/uL   Basophils Relative PENDING 0 - 1 %   Basophils Absolute PENDING 0.0 - 0.1 K/uL   WBC Morphology PENDING    RBC Morphology PENDING    Smear Review PENDING    nRBC PENDING 0 /100 WBC   Metamyelocytes Relative PENDING %   Myelocytes PENDING %   Promyelocytes Absolute PENDING %   Blasts PENDING %  I-STAT, chem 8     Status: Abnormal   Collection Time: Apr 21, 2015 12:43 AM  Result Value Ref Range   Sodium 135 135 - 145 mmol/L   Potassium 3.3 (L) 3.5 - 5.1 mmol/L   Chloride 95 (L) 101 - 111 mmol/L   BUN 54 (H) 6 - 20 mg/dL   Creatinine, Ser 2.40 (H) 0.61 - 1.24 mg/dL   Glucose, Bld 193 (H) 65 - 99 mg/dL   Calcium, Ion 0.97 (L) 1.13 - 1.30 mmol/L   TCO2  19 0 - 100 mmol/L   Hemoglobin 13.9 13.0 - 17.0 g/dL   HCT 41.0 39.0 - 52.0 %    Ct Abdomen Pelvis Wo Contrast  April 21, 2015  CLINICAL DATA:  Acute respiratory failure, sepsis, tender abdomen.  EXAM: CT CHEST, ABDOMEN AND PELVIS WITHOUT CONTRAST  TECHNIQUE: Multidetector CT imaging of the chest, abdomen and pelvis was performed following the standard protocol without IV contrast.  COMPARISON:  None.  FINDINGS: CT CHEST FINDINGS  Left jugular central line, right upper extremity PICC line, endotracheal tube and nasogastric tube appears satisfactorily positioned. There is dense consolidation in the left lower lobe posterior base with air bronchograms. Linear opacities are present elsewhere in both lungs, probably atelectatic. There is a very small left pleural effusion.  CT ABDOMEN AND PELVIS FINDINGS  There is a moderately large volume free intraperitoneal air. There is moderate dilatation of small bowel without discrete transition. Etiology of the free air is not conclusively established but there does appear to be a focally prominent thickening and irregularity of the mid sigmoid colon, with a generous volume of free air in the immediate vicinity. This could represent a perforation of the sigmoid in this area.  There are grossly unremarkable unenhanced appearances of the liver, spleen, pancreas, adrenals and kidneys. There are scattered calcifications about both kidneys which are more likely vascular.  Abdominal aorta is normal in caliber with mild atherosclerotic calcification.  There is a midline ventral hernia containing fat and peritoneal air. There is a fat containing left inguinal hernia.  IMPRESSION: 1. Free intraperitoneal air consistent with perforated bowel. These results were called by telephone at the time of interpretation on Apr 02, 2015 at 12:26 am to nurse Altha Harm, who verbally acknowledged these results. 2. Location of the perforated viscus is not conclusive a but there is suspicious focal  thickening of the mid sigmoid colon with adjacent inflammation; this could represent the site of perforation. 3. Dense consolidation in the posterior left lower lobe base with air bronchograms. This could be infectious or atelectatic. Very small left pleural effusion. 4. Midline ventral hernia containing fat and peritoneal air.   Electronically Signed   By: Andreas Newport M.D.   On: April 02, 2015 00:34   Ct Chest Wo Contrast  04-02-15   CLINICAL DATA:  Acute respiratory failure, sepsis, tender abdomen.  EXAM: CT CHEST, ABDOMEN AND PELVIS WITHOUT CONTRAST  TECHNIQUE: Multidetector CT imaging of the chest, abdomen and pelvis was performed following the standard protocol without IV contrast.  COMPARISON:  None.  FINDINGS: CT CHEST FINDINGS  Left jugular central line, right upper extremity PICC line, endotracheal tube and nasogastric tube appears satisfactorily positioned. There is dense consolidation in the left lower lobe posterior base with air bronchograms. Linear opacities are present elsewhere in both lungs, probably atelectatic. There is a very small left pleural effusion.  CT ABDOMEN AND PELVIS FINDINGS  There is a moderately large volume free intraperitoneal air. There is moderate dilatation of small bowel without discrete transition. Etiology of the free air is not conclusively established but there does appear to be a focally prominent thickening and irregularity of the mid sigmoid colon, with a generous volume of free air in the immediate vicinity. This could represent a perforation of the sigmoid in this area.  There are grossly unremarkable unenhanced appearances of the liver, spleen, pancreas, adrenals and kidneys. There are scattered calcifications about both kidneys which are more likely vascular.  Abdominal aorta is normal in caliber with mild atherosclerotic calcification.  There is a midline ventral hernia containing fat and peritoneal air. There is a fat containing left inguinal hernia.   IMPRESSION: 1. Free intraperitoneal air consistent with perforated bowel. These results were called by telephone  at the time of interpretation on 2015/04/22 at 12:26 am to nurse Altha Harm, who verbally acknowledged these results. 2. Location of the perforated viscus is not conclusive a but there is suspicious focal thickening of the mid sigmoid colon with adjacent inflammation; this could represent the site of perforation. 3. Dense consolidation in the posterior left lower lobe base with air bronchograms. This could be infectious or atelectatic. Very small left pleural effusion. 4. Midline ventral hernia containing fat and peritoneal air.   Electronically Signed   By: Andreas Newport M.D.   On: April 22, 2015 00:34   Dg Chest Port 1 View  03/31/2015   CLINICAL DATA:  Central line placement  EXAM: PORTABLE CHEST - 1 VIEW  COMPARISON:  03/31/2015 at 14:30  FINDINGS: The endotracheal tube is 5.9 cm above the carina. There is a right upper extremity PICC line with tip in the SVC. There is a new left jugular central line with tip in the SVC. There is no pneumothorax. There is persistent consolidation in the left base. Right lung is grossly clear.  IMPRESSION: Support equipment appears satisfactorily positioned.  New left jugular central line. No pneumothorax. No other significant interval change.   Electronically Signed   By: Andreas Newport M.D.   On: 03/31/2015 22:35   Dg Chest Port 1 View  03/31/2015   Raylene Miyamoto, MD     03/31/2015  4:45 PM  Intubation Procedure Note Chris Reyes [id number] 02/01/1951  Procedure: Intubation Indications: Respiratory insufficiency  Procedure Details Consent: Risks of procedure as well as the alternatives and risks  of each were explained to the (patient/caregiver).  Consent for  procedure obtained. Time Out: Verified patient identification, verified procedure,  site/side was marked, verified correct patient position, special  equipment/implants available,  medications/allergies/relevent  history reviewed, required imaging and test results available.   Performed  MAC and 3   Evaluation Hemodynamic Status: Transient hypotension resolved spontaneously;  O2 sats: stable throughout Patient's Current Condition: stable Complications: No apparent complications Patient did tolerate procedure well. Chest X-ray ordered to verify placement.  CXR: tube position  acceptable.  Note: Patient had NG tube placed and greater than 2L of fluid was  drained before intubation began. Procedure was postponed until  the NG suction flow had slowed to an acceptable rate to where  aspiration risk seemed lowest.   Raylene Miyamoto. 03/31/2015  Glide Tolerated well  Chris Reyes. Chris Reyes, [place] Pgr: [facility]   Dg Chest Port 1 View  03/31/2015   CLINICAL DATA:  Postop endotracheal tube placement.  EXAM: PORTABLE CHEST - 1 VIEW  COMPARISON:  Radiograph 03/31/2015  FINDINGS: Endotracheal tube position 4.8 cm from carina. NG tube with tip in the distal esophagus. Stable enlarged cardiac silhouette. There is a band of atelectasis in the LEFT mid lung. LEFT lower lobe atelectasis and effusion.  IMPRESSION: 1. Endotracheal tube in good position. 2. Cardiomegaly and LEFT lung atelectasis.   Electronically Signed   By: Suzy Bouchard M.D.   On: 03/31/2015 14:50   Dg Chest Port 1 View  03/31/2015   CLINICAL DATA:  Status post central line placement.  EXAM: PORTABLE CHEST - 1 VIEW  COMPARISON:  Same day.  FINDINGS: Stable cardiomegaly. Status post coronary artery bypass graft. No pneumothorax is noted. Right lung is clear. Elevated left hemidiaphragm is noted. Slightly increased left perihilar and basilar opacities are noted concerning for atelectasis or pneumonia with associated pleural effusion. Interval placement of right-sided PICC line with  distal tip in the expected position of the right atrium.  IMPRESSION: Slightly increased left perihilar and basilar  opacities are noted concerning for atelectasis or pneumonia with associated pleural effusion.  Interval placement of right-sided PICC line with distal tip in expected position of the right atrium. Withdrawal by 3-4 cm is recommended. These results will be called to the ordering clinician or representative by the Radiologist Assistant, and communication documented in the PACS or zVision Dashboard.   Electronically Signed   By: Marijo Conception, M.D.   On: 03/31/2015 13:35   Dg Chest Port 1 View  03/31/2015   CLINICAL DATA:  Status post aortic valve replacement.  EXAM: PORTABLE CHEST - 1 VIEW  COMPARISON:  March 30, 2015.  FINDINGS: Stable cardiomegaly. Status post coronary artery bypass graft. No pneumothorax is noted. Elevated left hemidiaphragm is again noted. Left lower lobe opacity is noted concerning for pneumonia or subsegmental atelectasis.  IMPRESSION: Grossly stable left lower lobe opacity is noted concerning for pneumonia or subsegmental atelectasis.   Electronically Signed   By: Marijo Conception, M.D.   On: 03/31/2015 07:57   Dg Chest Port 1 View  03/30/2015   CLINICAL DATA:  Status post CABG and aortic valve replacement on March 27, 2015.  EXAM: PORTABLE CHEST - 1 VIEW  COMPARISON:  Portable chest x-ray of March 29, 2015  FINDINGS: The lungs are adequately inflated. There is persistent left lower lobe atelectasis. The perihilar lung markings are more conspicuous today on the left as well. The right lung is clear. The cardiac silhouette remains enlarged. The pulmonary vascularity is not engorged.  The right internal jugular Cordis sheath has been removed. A PICC line is present on the left with the tip projecting over the proximal SVC.  IMPRESSION: 1. Slight interval increase in the left perihilar and infrahilar atelectasis. 2. Stable enlargement of the cardiac silhouette without significant pulmonary vascular congestion. The appearance of the pulmonary interstitium has improved especially on the right.    Electronically Signed   By: Jerian  Martinique M.D.   On: 03/30/2015 07:49   Dg Abd Portable 1v  03/31/2015   CLINICAL DATA:  Postop ileus  EXAM: PORTABLE ABDOMEN - 1 VIEW  COMPARISON:  Chest radiograph 03/31/2015  FINDINGS: Stomach is gas distended. NG tube with tip at the GE junction. The side port is above the GE junction. No dilated loops of large or small bowel. There is gas and stool within the rectum.  IMPRESSION: 1. Gas distended stomach. Consider advancement of the NG tube by 8-10 cm for improved decompression. 2. No evidence of bowel obstruction. Gas-filled loops of large and small bowel may represent ileus.   Electronically Signed   By: Suzy Bouchard M.D.   On: 03/31/2015 14:57    Review of Systems  Constitutional: Positive for fever.  HENT: Negative.   Respiratory: Negative.   Cardiovascular: Negative.   Gastrointestinal: Positive for abdominal pain.  Musculoskeletal: Negative.   Skin: Negative.   Neurological: Negative.    Blood pressure 106/82, pulse 32, temperature 103.4 F (39.7 C), temperature source Oral, resp. rate 36, height $RemoveBe'5\' 11"'aAwPRsunI$  (1.803 m), weight 110.7 kg (244 lb 0.8 oz), SpO2 97 %. CVP-10 Physical Exam  Vitals reviewed. Constitutional: He appears well-developed and well-nourished. He appears lethargic. No distress.  HENT:  Head: Normocephalic and atraumatic.  Right Ear: Hearing, tympanic membrane and ear canal normal. No lacerations. No drainage or tenderness. No foreign bodies. Tympanic membrane is not perforated. No hemotympanum.  Left Ear:  Hearing, tympanic membrane and ear canal normal. No lacerations. No drainage or tenderness. No foreign bodies. Tympanic membrane is not perforated. No hemotympanum.  Nose: No nose lacerations, sinus tenderness, nasal deformity or nasal septal hematoma. No epistaxis.  Mouth/Throat: Uvula is midline and mucous membranes are normal. No lacerations.  Eyes: Conjunctivae, EOM and lids are normal. Pupils are equal, round, and reactive  to light.  Neck: Trachea normal.  Cardiovascular: Normal heart sounds.   Respiratory: He exhibits no bony tenderness, no laceration and no crepitus.  GI: Soft. Normal appearance. He exhibits distension. Bowel sounds are decreased. There is tenderness (x4Q). There is no rigidity, no rebound and no guarding.  Musculoskeletal: Normal range of motion. He exhibits no edema or tenderness.  Neurological: He has normal strength. He appears lethargic. GCS eye subscore is 4. GCS verbal subscore is 5. GCS motor subscore is 6.  Skin: Skin is intact.  Psychiatric: His speech is normal.    Assessment/Plan: 64 y/o M s/p AVR and CABG x 3 with free air, currently septic and intubated, likely with abdominal catastrophe. 1. I discussed with the patient's son at the bedside, the patient's current condition.  I discussed with him that he could likely have partial or total ischemic gut requiring resection if possible.  Pt will likely require open abdomen postop.  I d/w his son that he may not make it out of the OR and will likely become very ill in the post op period.  His son stated he wants everything done for him currently and would like for [place] to proceed to the OR for exploration. 2. 4U FFP to reverse INR 3. Consent for surgery, Ex lap, possible bowel resection.   Rosario Jacks., Can Lucci 2015-04-30, 1:12 AM

## 2015-04-03 NOTE — Procedures (Signed)
Hemodialysis Insertion Procedure Note Chris Reyes [id number] 1951-01-28  Procedure: Insertion of Hemodialysis Catheter Type: 3 port  Indications: Hemodialysis   Procedure Details Consent: Unable to obtain consent because of altered level of consciousness. Time Out: Verified patient identification, verified procedure, site/side was marked, verified correct patient position, special equipment/implants available, medications/allergies/relevent history reviewed, required imaging and test results available.  Performed  Maximum sterile technique was used including antiseptics, cap, gloves, gown, hand hygiene, mask and sheet. Skin prep: Chlorhexidine; local anesthetic administered A antimicrobial bonded/coated triple lumen catheter was placed in the right internal jugular vein using the Seldinger technique. Ultrasound guidance used.Yes.   Catheter placed to 16 cm. Blood aspirated via all 3 ports and then flushed x 3. Line sutured x 2 and dressing applied.  Evaluation Blood flow good Complications: No apparent complications Patient did tolerate procedure well. Chest X-ray ordered to verify placement.  CXR: pending.  [name] [name] ACNP [name] PCCM Pager [phone] till 3 pm If no answer page [phone] 05-01-2015, 9:36 AM

## 2015-04-03 NOTE — Progress Notes (Signed)
PARENTERAL NUTRITION CONSULT NOTE - INITIAL  Pharmacy Consult for TPN Indication: S/P ex lap, sigmoid resection for perferated sigmoid colon - Prolonged NPO status anticipated  No Known Allergies  Patient Measurements: Height: _0  (180.3 cm) Weight: 244 lb 0.8 oz (110.7 kg) IBW/kg (Calculated) : 75.3   Vital Signs: Temp: 101.5 F (38.6 C) (07/30 0900) BP: 96/74 mmHg (07/30 0900) Pulse Rate: 102 (07/30 0900) Intake/Output from previous day: 07/29 0701 - 07/30 0700 In: 6888.3 [P.O.:240; I.V.:4136.3; Blood:1082; NG/GT:60; IV Piggyback:1370] Out: 8676 [Urine:1055; Emesis/NG output:4100] Intake/Output from this shift:    Labs:  Recent Labs  03/31/15 0215  04/24/15 0037  24-Apr-2015 0328 2015-04-24 0330 04-24-15 0423 2015-04-24 0653  WBC 9.5  --  8.3  --   --   --   --  13.0*  HGB 16.1  < > 14.1  < > 12.9*  --  12.2* 12.4*  HCT 46.0  < > 40.4  < > 38.0*  --  36.0* 37.0*  PLT 203  --  156  --   --   --   --  128*  INR 2.09*  --  6.72*  --   --  1.39  --  1.95*  < > = values in this interval not displayed.   Recent Labs  03/30/15 0420  03/31/15 0215  2015-04-24 0037 04-24-2015 0038 24-Apr-2015 7209 2015-04-24 0328 Apr 24, 2015 0423 04/24/15 0653  NA 132*  < > 132*  < > 136  --  135 137 138 138  K 4.0  < > 3.9  < > 3.3*  --  3.3* 3.0* 3.3* 3.4*  CL 96*  < > 95*  < > 98*  --  95*  --   --  100*  CO2 29  < > 26  --  22  --   --   --   --  17*  GLUCOSE 130*  < > 135*  < > 188*  --  193*  --   --  63*  BUN 22*  < > 36*  < > 62*  --  54*  --   --  59*  CREATININE 0.93  < > 1.06  < > 2.42*  --  2.40*  --   --  2.78*  CALCIUM 8.4*  < > 8.8*  --  7.0*  --   --   --   --  6.6*  MG 2.4  --   --   --  2.3  --   --   --   --   --   PHOS  --   --   --   --  4.0  --   --   --   --   --   PROT 5.8*  --  7.0  --  4.2*  --   --   --   --  4.2*  ALBUMIN 2.6*  --  2.6*  --  1.7*  --   --   --   --  1.8*  AST 32  --  26  --  49*  --   --   --   --  134*  ALT 19  --  20  --  24  --   --   --   --   74*  ALKPHOS 42  --  41  --  35*  --   --   --   --  36*  BILITOT 1.5*  --  1.4*  --  1.8*  --   --   --   --  2.0*  PREALBUMIN  --   --   --   --  3.8*  --   --   --   --   --   TRIG  --   --   --   --   --  37  --   --   --   --   < > = values in this interval not displayed. Estimated Creatinine Clearance: 33.5 mL/min (by C-G formula based on Cr of 2.78).    Recent Labs  April 07, 2015 0030 Apr 07, 2015 0622 2015-04-07 0650  GLUCAP 46* 13* 54*    Medical History: Past Medical History  Diagnosis Date  . Hypertension   . Hernia   . COPD (chronic obstructive pulmonary disease)   . CAD (coronary artery disease)     Catheterization, [place], July, 2006, EF 45%,  50% LAD, 50% RCA, 90% marginal, possible collaterals from LAD  to a branch of the PDA, medical therapy recommended  . PAD (peripheral artery disease)     Arterial leg Dopplers, April, 2014, normal  . Ejection fraction     EF 45%, catheterization, 2006  //   EF 55%, echo, March, 2012, wall abnormality at the base of the inferior wall  . Aortic stenosis     severe echo 01/2013- cannot rule out bicuspid aortic valve  //   moderately severe, echo,  March, 2012 ( progression since 2009)  . Pulmonary emboli 2007    Significant, 2007, Coumadin therapy  . Warfarin anticoagulation     History pulmonary emboli  . Tobacco abuse   . Carotid artery disease     Doppler, March, 2012,  0-39% bilateral  . Dyslipidemia   . Heart murmur   . History of hiatal hernia   . GERD (gastroesophageal reflux disease)   . Chronic lower back pain   . RBBB   . Shortness of breath dyspnea   . History of pneumonia   . Hx of gallstones   . Sinus headache   . Migraine     "not very often at all" (07/13/2014)    Medications:  Prescriptions prior to admission  Medication Sig Dispense Refill Last Dose  . aspirin EC 81 MG EC tablet Take 1 tablet (81 mg total) by mouth daily.   Past Week at Unknown time  . Coenzyme Q10 (COQ10) 100 MG CAPS Take 1 capsule  by mouth daily.    Past Week at Unknown time  . fish oil-omega-3 fatty acids 1000 MG capsule Take 1 g by mouth daily.   Past Month at Unknown time  . fluticasone (FLONASE) 50 MCG/ACT nasal spray Place 1 spray into both nostrils daily. 16 g 2 03/23/2015 at 0445  . isosorbide mononitrate (IMDUR) 30 MG 24 hr tablet Take 30 mg by mouth daily.   03/26/2015 at Unknown time  . losartan (COZAAR) 50 MG tablet TAKE 1 TABLET (50 MG TOTAL) BY MOUTH DAILY. 90 tablet 3 03/06/2015 at 0445  . ibuprofen (ADVIL,MOTRIN) 200 MG tablet Take 600 mg by mouth every 6 (six) hours as needed (pain).   More than a month at Unknown time  . warfarin (COUMADIN) 5 MG tablet TAKE AS DIRECTED BY ANTICOAGULATION CLINIC (Patient not taking: Reported on 03/22/2015) 90 tablet 0 Not Taking at Unknown time    Insulin Requirements in the past 24 hours:  None  Current Nutrition:  NPO  Assessment: 64 year old man s/p AVR and  CABG on 7/25 who went into respiratory failure on 7/29.  Found to have perforated viscous and was taken emergently to surgery on 7/30 for sigmoid colon resection.    Nutritional Goals:  Pending RD assessment  Surgeries/Procedures: 7/25:  AVR and CABG 7/30:  S/P ex lap, sigmoid resection for perferated sigmoid colon  GI: NPO post op.  History of GI bleeding in 2006, GERD and histal hernia.   Endo: No history of diabetes or thyroid disease Lytes: K 3.4, Ionized Ca 0.9, Mg 2.3, Phos 4 Renal: Has AKI, latest creatinine is 2.7 Pulm: Vent - 100% FiO2 Cards:  levophed, phenylephrine, vasopressin, dopamine, amiodarone.  History of CAD, HTN.  CABG. AVR on 03/11/2015 Hepatobil: Alk phos 35, Tbili 1.8, AST 49, ALT 24 Neuro: GCS 3, RASS -3 ID:   Vancomycin/Zosyn since 7/29.  Was on Vanco/cefuroxime post op 7/25-7/25 and ceftazidime 7/28-7/29. Best Practices: to be resumed on heparin drip TPN Access: PICC line TPN start date: 04-27-15   Plan:  Start Clinimix E 5/15 at 62m/hr.  This will provide 48g protein and 682  KCal.   Stop D5W with TPN starts TPN labs in AM No lipids for 7 days while in ICU per protocol.  FCandie Mile72016/08/259:25 AM

## 2015-04-03 NOTE — Progress Notes (Signed)
Dr. Roxan Hockey on unit.  Made aware of Central line placed and that we would be taking patient shortly to CT scan for CT of Abdomen.  Order received to give 1 amp Sodium Bicarb.

## 2015-04-03 NOTE — Progress Notes (Signed)
eLink Physician-Brief Progress Note Patient Name: Chris Reyes DOB: June 19, 1951 MRN: [id number]   Date of Service  15-Apr-2015  HPI/Events of Note  Lactic acidosis on vent with pH 7.2 and lactate of 5.0.  CT of abd shows free air.  CVTS calling surgical consult.  eICU Interventions  Plan: Increase RR on vent to 30 2 amps of bicarb IVP now Recheck ABG in 1 hour post vent change     Intervention Category Major Interventions: Acid-Base disturbance - evaluation and management  DETERDING,ELIZABETH 04-15-15, 12:32 AM

## 2015-04-03 NOTE — Progress Notes (Signed)
Dr. Jimmy Footman notified of ABG results pH 7.29 PCO2 40 PO2 90 Bicarbonate 18.7 Acid Base Deficit 6.0.  Order received for 2 amps of Bicarb.  Also to increase RR to 35

## 2015-04-03 NOTE — Progress Notes (Signed)
Hypoglycemic Event  CBG: 47  Treatment: 1 amp Dextrose  Symptoms: clammy/diaphoretic  Follow-up CBG: Time: 9037    CBG Result:193  Possible Reasons for Event: Sepsis  Comments/MD notified: Richardean Chimera, Waldemar Dickens  Remember to initiate Hypoglycemia Order Set & complete

## 2015-04-03 NOTE — Transfer of Care (Signed)
Immediate Anesthesia Transfer of Care Note  Patient: Chris Reyes  Procedure(s) Performed: Procedure(s): EXPLORATORY LAPAROTOMY, SIGMOID RESECTION (N/A)  Patient Location: PACU  Anesthesia Type:General  Level of Consciousness: sedated and Patient remains intubated per anesthesia plan  Airway & Oxygen Therapy: Patient remains intubated per anesthesia plan and Patient placed on Ventilator (see vital sign flow sheet for setting)  Post-op Assessment: Report given to RN and Post -op Vital signs reviewed and unstable, Anesthesiologist notified  Post vital signs: Reviewed and stable  Last Vitals:  Filed Vitals:   04/27/2015 0515  BP:   Pulse: 106  Temp:   Resp: 35    Complications: No apparent anesthesia complications

## 2015-04-03 DEATH — deceased

## 2015-04-05 LAB — CULTURE, BLOOD (ROUTINE X 2)
CULTURE: NO GROWTH
Culture: NO GROWTH

## 2015-04-05 LAB — TYPE AND SCREEN
ABO/RH(D): O POS
Antibody Screen: NEGATIVE
Unit division: 0
Unit division: 0
Unit division: 0
Unit division: 0

## 2015-04-06 MED FILL — Medication: Qty: 1 | Status: AC

## 2015-04-14 NOTE — Anesthesia Postprocedure Evaluation (Signed)
  Anesthesia Post-op Note  Patient: Chris Reyes  Procedure(s) Performed: Procedure(s): AORTIC VALVE REPLACEMENT (AVR) (N/A) CORONARY ARTERY BYPASS GRAFTING (CABG), ON PUMP, TIMES THREE, USING LEFT INTERNAL MAMMARY ARTERY, RIGHT GREATER SAPHENOUS VEIN HARVESTED ENDOSCOPICALLY (N/A) TRANSESOPHAGEAL ECHOCARDIOGRAM (TEE) (N/A)  Patient did well immed post-op cardiac surgery.  Died post-op day 4.

## 2015-04-14 NOTE — Progress Notes (Signed)
Discharge-death summary  Date of Admission:  2015-04-23 Date of Death : 54 12/27/22 12-28-2014  Admitting diagnoses:  Severe aortic stenosis  Multivessel coronary artery disease  Moderate LV dysfunction  History DVT and pulmonary emboli on chronic coumadin  COPD, history smoking  Operations - Procedures   AVR- CABG using pericardial AVR with CABG x 3 on  2023-04-23- 16, by Dr  Prescott Gum   Emergency Laparotomy with resection of sigmoid colon on 7- 12/27/2022 -16 by  Dr [facility] course  The patient had been previously evaluated by Dr. Ron Parker and had undergone echocardiogram and left and right heart catheterization which documented the above cardiac diagnoses of severe aortic stenosis and coronary artery disease. The patient had been seen in consultation in the office and counseled on the above procedure of combined aortic valve replacement and coronary artery bypass grafting. The patient's preoperative studies were completed and were satisfactory for surgery. Informed consent had been obtained and the patient was admitted on the above date for surgery.  Aortic valve replacement combined with 3 vessel CABG was completed in routine fashion without complication. The patient returned to the ICU from the OR in hemodynamically stable condition without bleeding from the chest tubes. The patient was extubated within 12 hours of surgery. On first postoperative day the patient progressed with removal of invasive monitoring  lines and was mobilized out of bed and remained in sinus rhythm. On the second day the patient developed some atrial fibrillation which was treated with IV amiodarone. The patient was able to ambulate around the ICU. On the third and fourth  postop day the patient had some nausea and hiccups and generalized weakness and was not able to ambulate in the hallway. Laboratory values were satisfactory and chest x-ray showed some evidence of gastric dilatation with some atelectasis at the R  base.Postop  pneumonia was suspected and he was started on iv antibiotics and iv reglan.   On the fifth postop day the patient became acidotic,hypotensive and required intubation. He developed sepsis and radiographic study ( CT scan ) showed free abdominal air with thickening of the sigmoid colon. He underwent evaluation and subsequent sigmoid colon resection by General surgery for perforation of the sigmoid colon. Postop he had persistent severe sepsis, muti-organ failure  and hypotension which did not respond to full supportive measures.  The family requested DNR and palliative care and the patient soon expired.  Final - Discharge Diagnoses    Severe Aortic stenosis, multivessel CAD History of DVT, PE COPD Postop perforation of sigmoid colon with underlying diverticulitis and    ischemic changes noted on path, no evidence of neoplasm  Postop  sepsis, acute renal and multiorgan failure resulting in  death

## 2015-05-04 NOTE — Discharge Summary (Signed)
Date of admission-03/09/2015 Date of discharge, death Apr 24, 2015  Admission diagnoses 1. Combined severe aortic stenosis and three-vessel CAD with moderate LV dysfunction 2. COPD 3. History of DVT and pulmonary emboli on chronic Coumadin therapy 4. Obesity  Operations and procedures  1. Combined aVR-CABG 3 by Dr. Lucianne Lei trigt on 03/24/2015 2. Emergency laparotomy and resection of sigmoid colon with colostomy performed by Dr. Rosendo Gros on 2015/04/24  Hospital course:  The patient is a 64 year old Caucasian male with known severe aortic stenosis followed by Dr. Ron Parker. The patient had been recommended aortic valve replacement earlier but was unwilling to proceed with the procedure because of family-personal reasons. After I first evaluated the patient office he subsequently underwent left and right heart catheterization months later. This demonstrated significant coronary disease as well. I reviewed the results of the cardiac testing and counseled the patient regarding combined aVR-CABG for treatment of his severe aortic stenosis and coronary artery disease. Informed consent was obtained and surgery scheduled.  On the date of surgery noted above the patient underwent combined aVR-CABG 3 without incident or copy patient. A bioprosthetic valve was performed and the patient was hemodynamically stable at the time of transfer to ICU. There is no significant postop bleeding. The patient was extubated per routine and on postop day 1 had invasive monitoring lines removed per routine and he was mobilize out of bed to chair. On the second postop day the patient was able to ambulate. He developed atrial fibrillation and was started on IV amiodarone. On the third and fourth postop days the patient had complaints of nausea, weakness, dizziness, hiccups, and laboratory values were satisfactory. Chest x-ray showed a possible infiltrate at the left base associated with some gastric dilatation and the patient was started  on IV antibiotics for possible pneumonia and started on IV Reglan.  On the fifth postop day the patient developed evidence of sepsis with hypotension, acidosis, elevated white count, and required intubation. CT scan of the abdomen demonstrated free abdominal air with thickening of the sigmoid colon as the possible point of perforation. The patient was seen by general surgery and underwent emergency laparotomy and sigmoid colon resection with colostomy by Dr. Rosendo Gros. The patient was hemodynamically unstable due to hypertension and required high dose of pressors. Unfortunately his condition did not stabilize postop colectomy and he developed multiorgan system failure including acute renal failure. Despite maximal attempts at stabilizing the patient including cardioversion for ventricular arrhythmias the patient did not respond and family requested palliative comfort care. The patient subsequently expired from sepsis and multiorgan failure. Pathology report on the resected colon demonstrated no evidence of neoplasm, evidence of diverticulitis with associated ischemic changes and perforation. Postop autopsy was not requested by the family.  Final-discharge diagnoses 1. Severe aortic stenosis, three-vessel coronary disease     With moderate LV dysfunction 2. History of DVT and pulmonary emboli on chronic Coumadin therapy 3. COPD 4. History obesity 5. Postoperative atrial fibrillation 6. Postoperative sepsis from sigmoid colon perforation with acute renal failure and multiorgan failure.

## 2016-04-29 IMAGING — CT CT ABD-PELV W/O CM
1 of 3 series · 16 of 46 positions shown, 18 images · non-contrast
Comparison: None.

CLINICAL DATA: Acute respiratory failure, sepsis, tender abdomen.

EXAM:
CT CHEST, ABDOMEN AND PELVIS WITHOUT CONTRAST
TECHNIQUE: Multidetector CT imaging of the chest, abdomen and pelvis was
performed following the standard protocol without IV contrast.

[Series 201: cap with, idose (2) · axial · 0.98mm/px · z∈[-727,-87]mm · 16 of 138 slices shown, 18 images]
[im 5/138  soft-tissue]
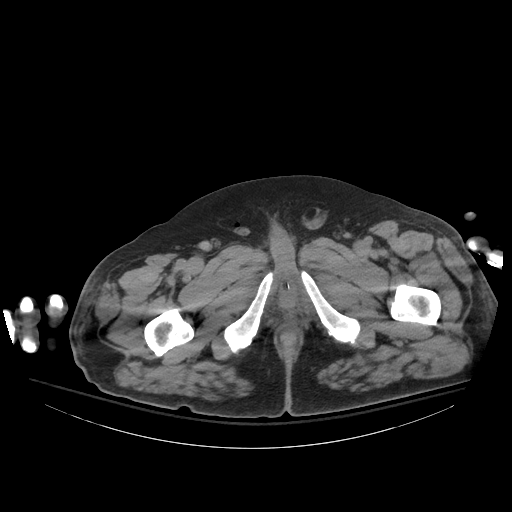
[im 5/138  bone]
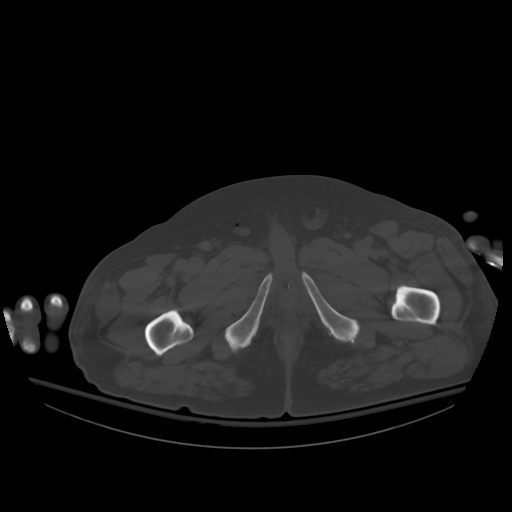
[im 14/138  soft-tissue]
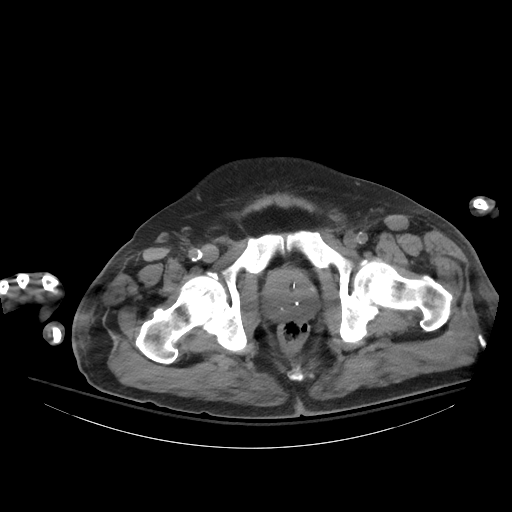
[im 23/138  soft-tissue]
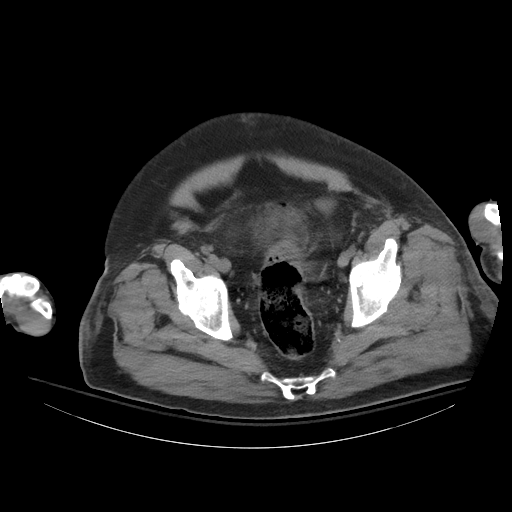
[im 31/138  soft-tissue]
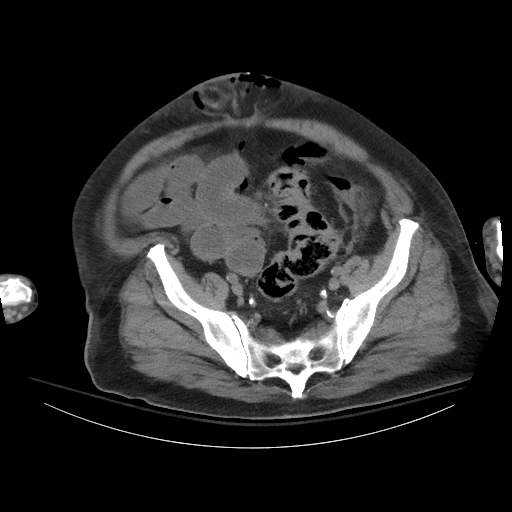
[im 40/138  soft-tissue]
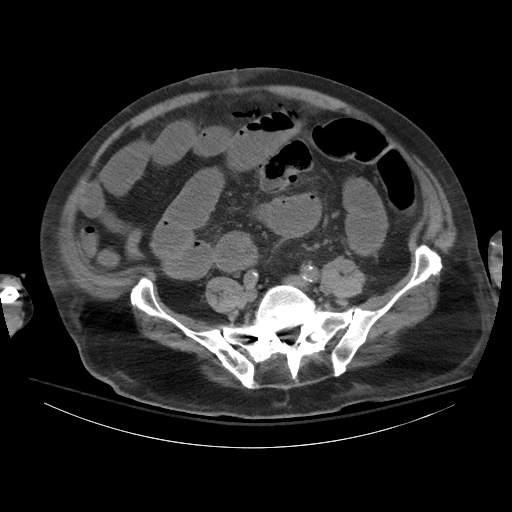
[im 49/138  soft-tissue]
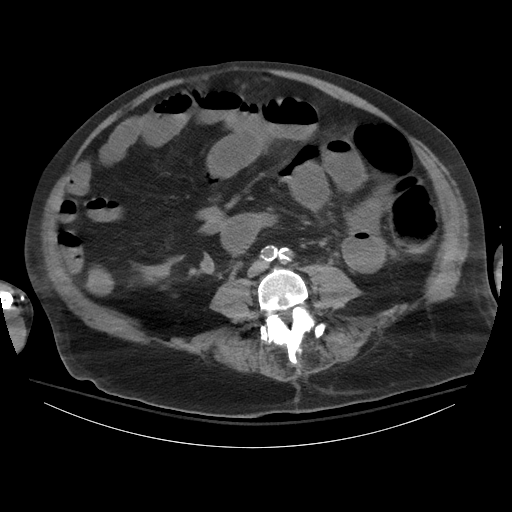
[im 58/138  soft-tissue]
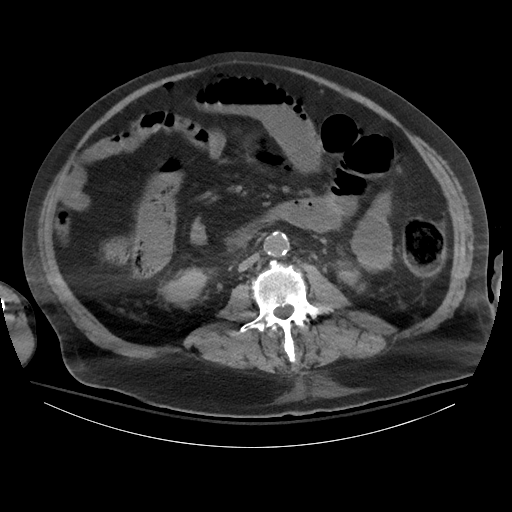
[im 67/138  soft-tissue]
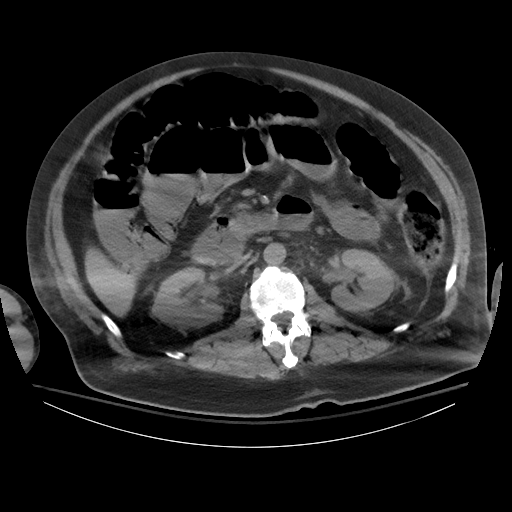
[im 71/138  soft-tissue]
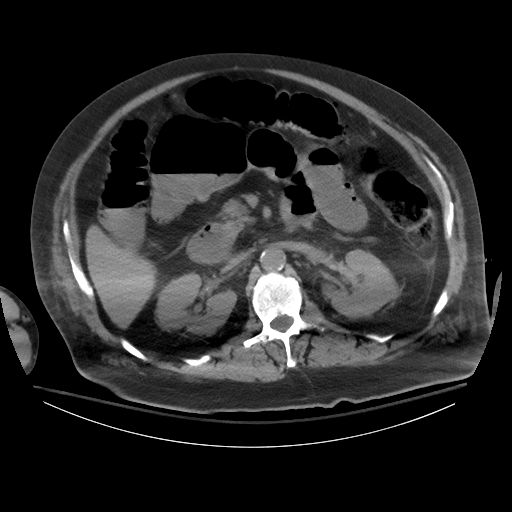
[im 71/138  bone]
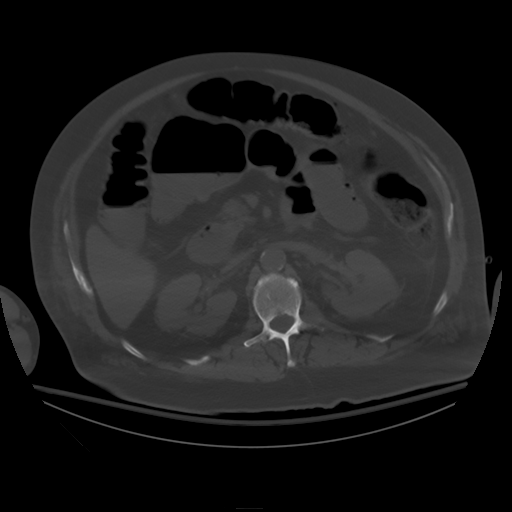
[im 80/138  soft-tissue]
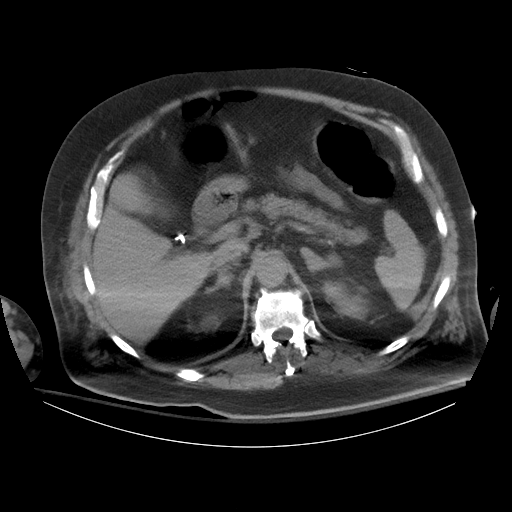
[im 89/138  soft-tissue]
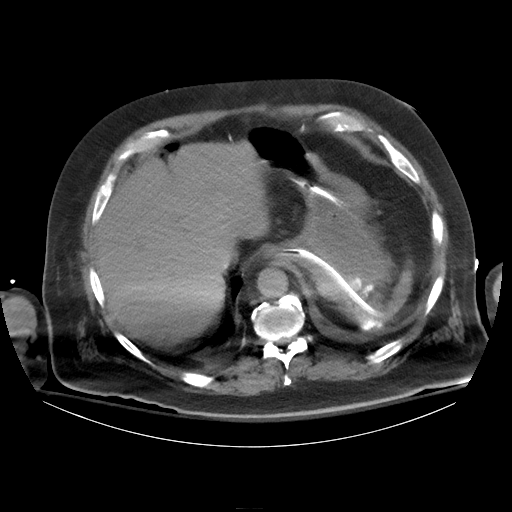
[im 98/138  soft-tissue]
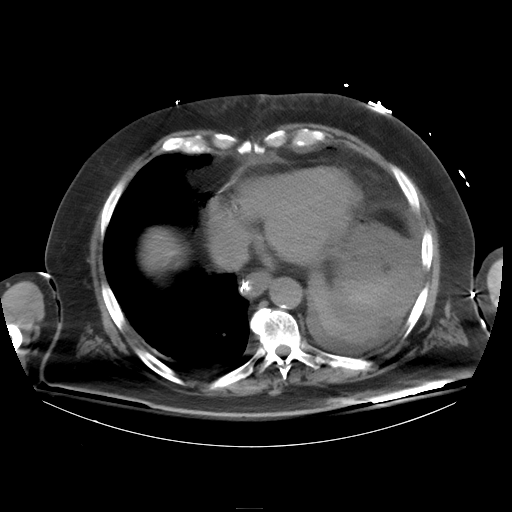
[im 107/138  soft-tissue]
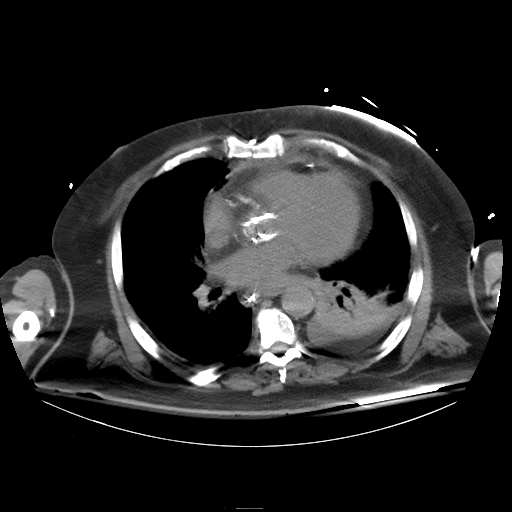
[im 115/138  soft-tissue]
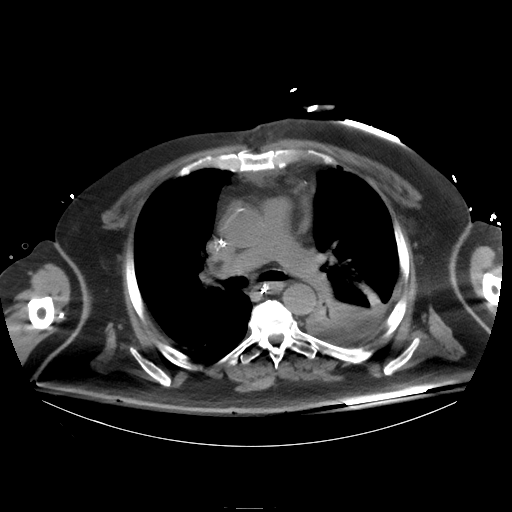
[im 124/138  soft-tissue]
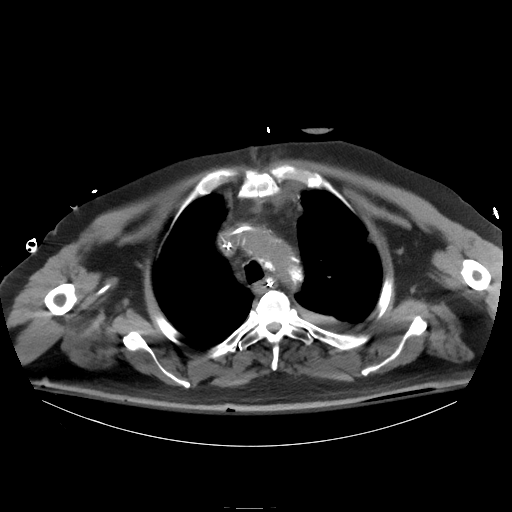
[im 133/138  soft-tissue]
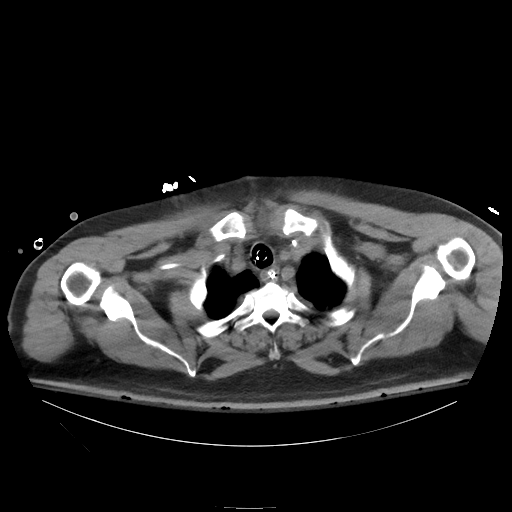

[16 of 46 positions shown; findings below may reference images not displayed]

FINDINGS: CT CHEST FINDINGS

Left jugular central line, right upper extremity PICC line,
endotracheal tube and nasogastric tube appears satisfactorily
positioned. There is dense consolidation in the left lower lobe
posterior base with air bronchograms. Linear opacities are present
elsewhere in both lungs, probably atelectatic. There is a very small
left pleural effusion.

CT ABDOMEN AND PELVIS FINDINGS

There is a moderately large volume free intraperitoneal air. There
is moderate dilatation of small bowel without discrete transition.
Etiology of the free air is not conclusively established but there
does appear to be a focally prominent thickening and irregularity of
the mid sigmoid colon, with a generous volume of free air in the
immediate vicinity. This could represent a perforation of the
sigmoid in this area.

There are grossly unremarkable unenhanced appearances of the liver,
spleen, pancreas, adrenals and kidneys. There are scattered
calcifications about both kidneys which are more likely vascular.

Abdominal aorta is normal in caliber with mild atherosclerotic
calcification.

There is a midline ventral hernia containing fat and peritoneal air.
There is a fat containing left inguinal hernia.
IMPRESSION: 1. Free intraperitoneal air consistent with perforated bowel. These
results were called by telephone at the time of interpretation on
04/01/2015 at [DATE] to nurse Marajn, who verbally acknowledged
these results.
2. Location of the perforated viscus is not conclusive a but there
is suspicious focal thickening of the mid sigmoid colon with
adjacent inflammation; this could represent the site of perforation.
3. Dense consolidation in the posterior left lower lobe base with
air bronchograms. This could be infectious or atelectatic. Very
small left pleural effusion.
4. Midline ventral hernia containing fat and peritoneal air.

## 2016-04-29 IMAGING — CR DG CHEST 1V PORT
1 series · 1 of 1 positions shown · non-contrast
Comparison: March 30, 2015.

CLINICAL DATA: Status post aortic valve replacement.

EXAM:
PORTABLE CHEST - 1 VIEW

[AP]
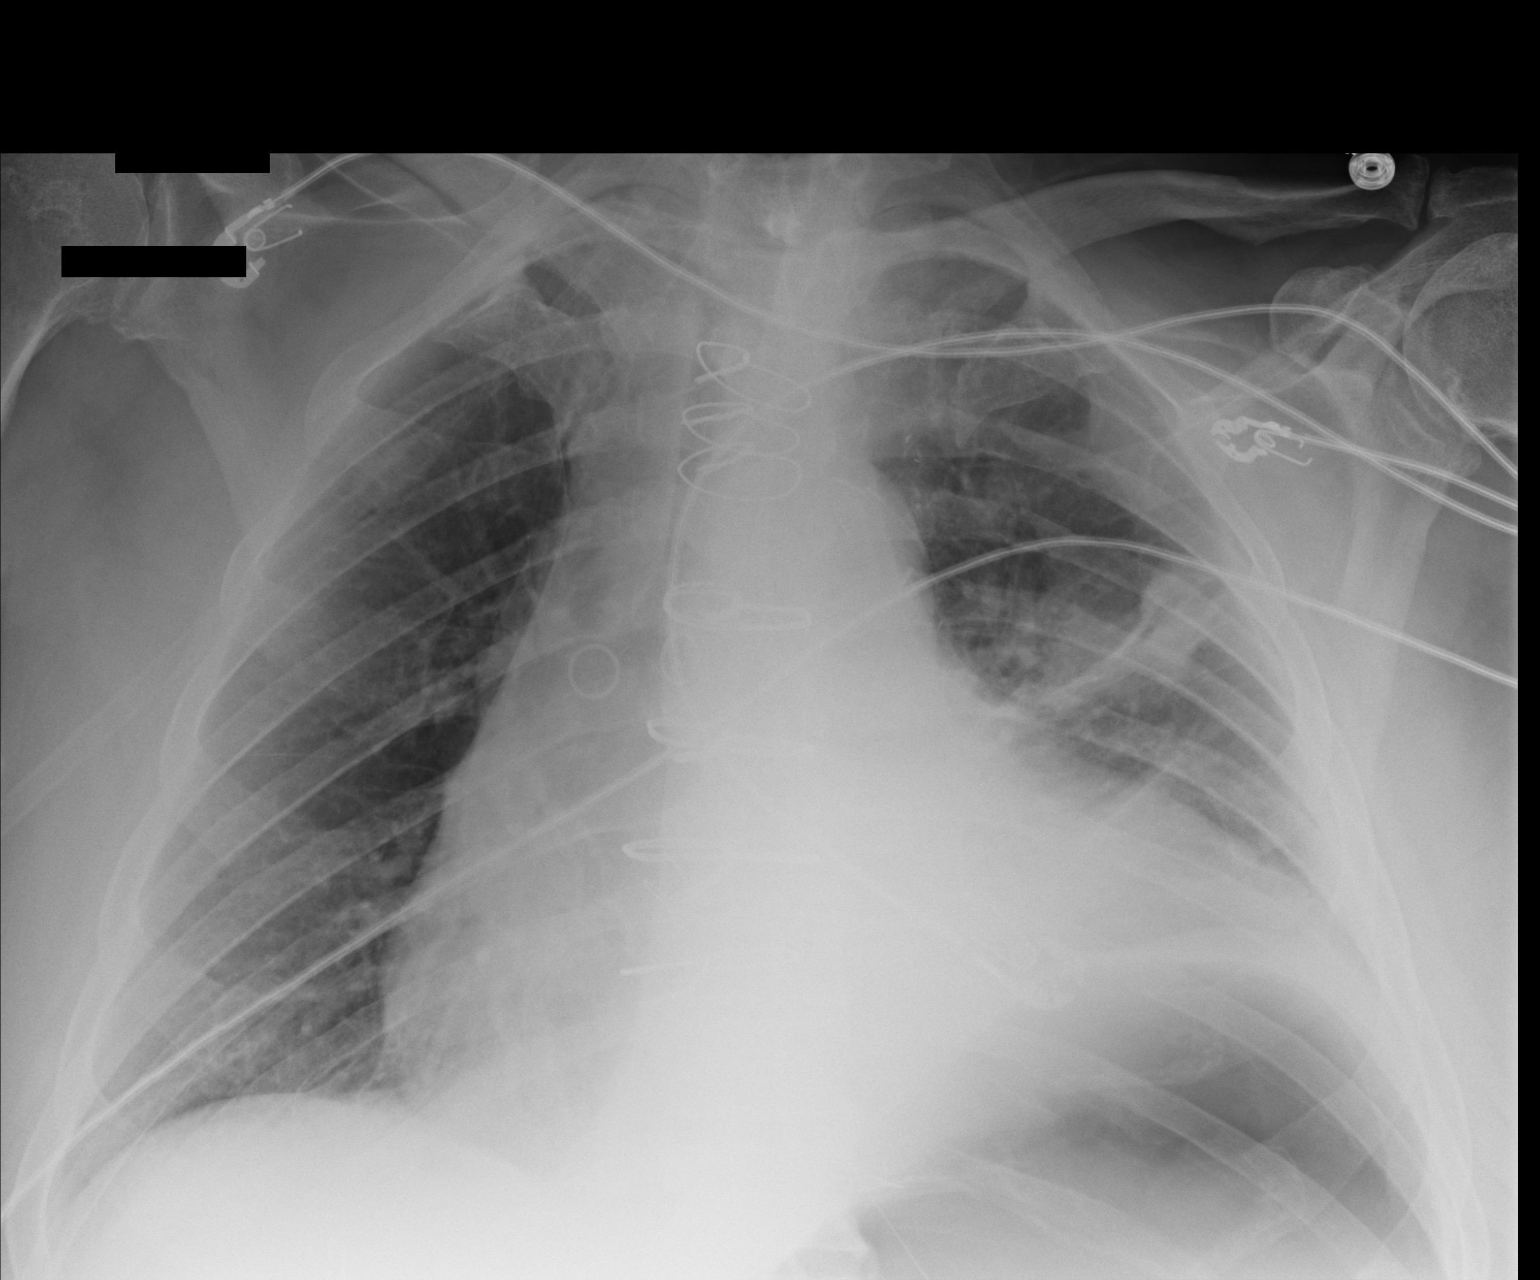

[1 of 1 positions shown; findings below may reference images not displayed]

FINDINGS: Stable cardiomegaly. Status post coronary artery bypass graft. No
pneumothorax is noted. Elevated left hemidiaphragm is again noted.
Left lower lobe opacity is noted concerning for pneumonia or
subsegmental atelectasis.
IMPRESSION: Grossly stable left lower lobe opacity is noted concerning for
pneumonia or subsegmental atelectasis.

## 2016-04-30 IMAGING — DX DG CHEST 1V PORT
1 series · 1 of 1 positions shown · non-contrast
Comparison: CT chest 03/31/2015, prior plain film 03/31/2015

CLINICAL DATA: 64-year-old male with a history of endotracheal tube
placement

EXAM:
PORTABLE CHEST - 1 VIEW

[chest ap]
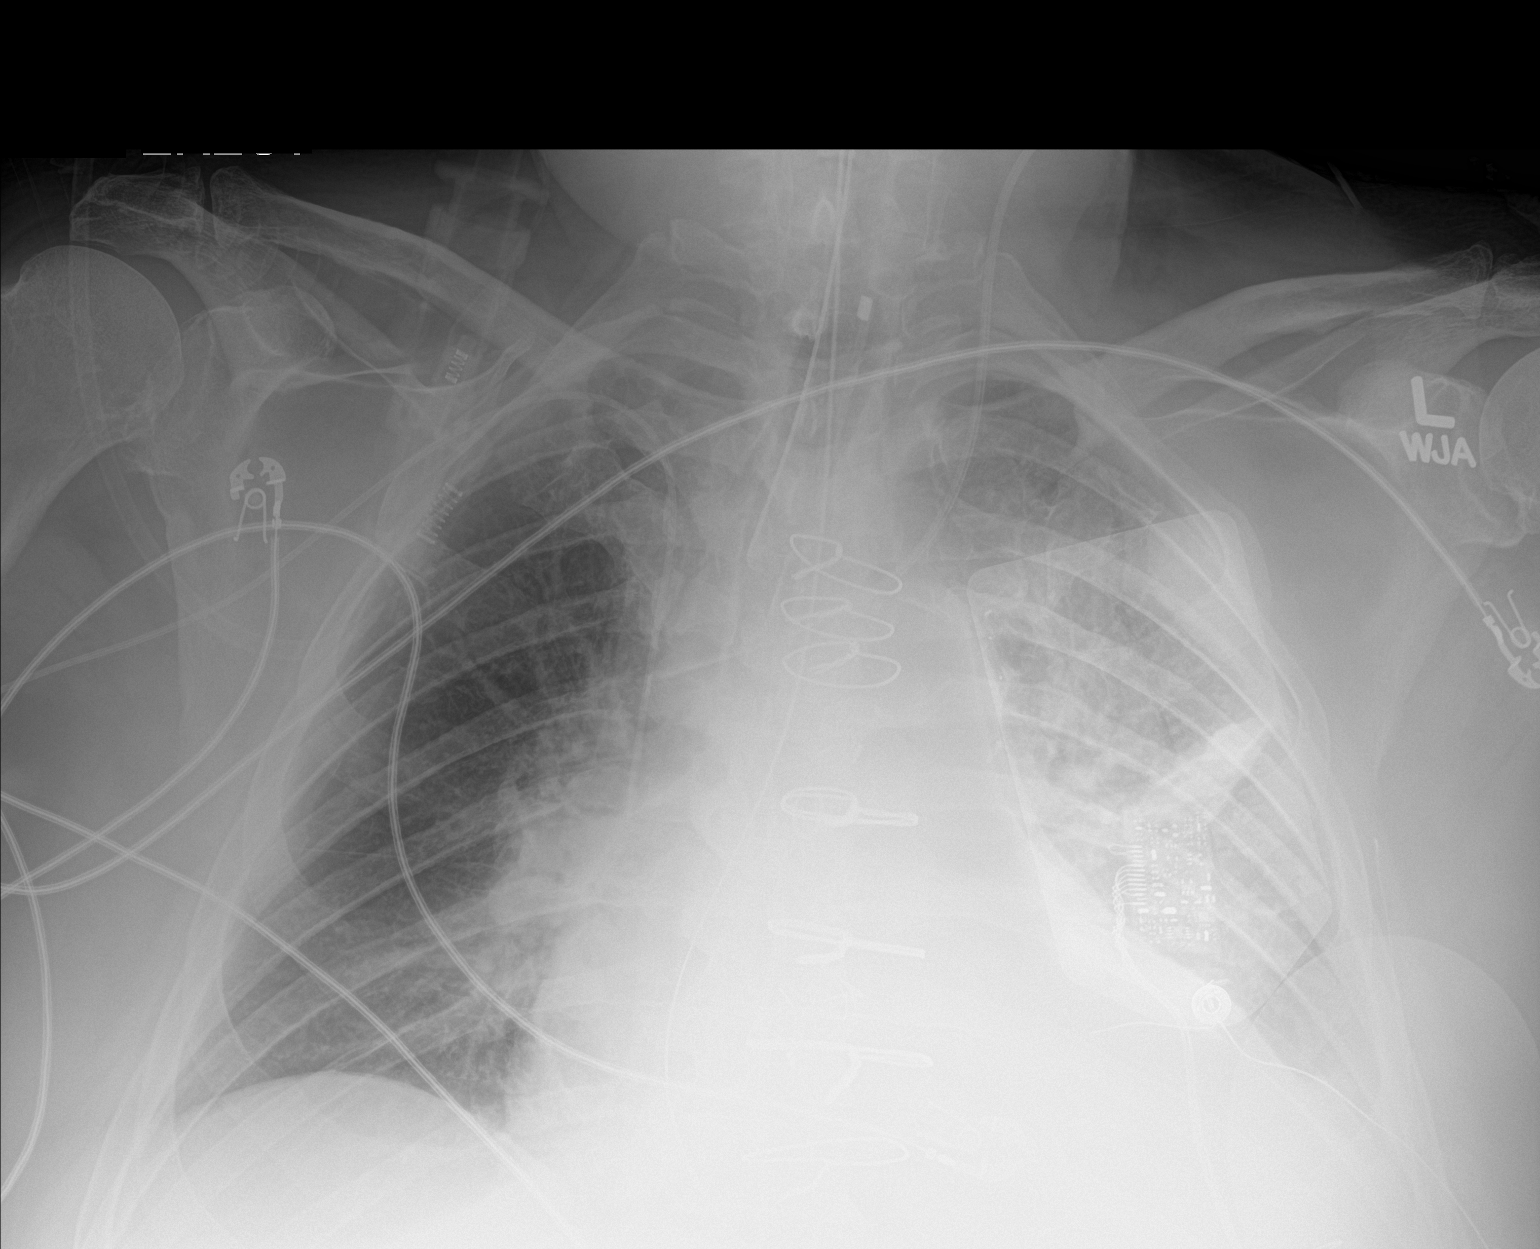

[1 of 1 positions shown; findings below may reference images not displayed]

FINDINGS: Cardiomediastinal silhouette likely unchanged, partially obscured by
overlying lung/ pleural disease.

Unchanged position of cardiac defibrillator is on left chest wall.

Unchanged position of endotracheal tube, terminating approximately
5.8 cm above the carina.

Unchanged left IJ central catheter which appears to terminate in the
superior vena cava. Unchanged right upper extremity PICC, appearing
to terminate at the cavoatrial junction.

Surgical changes of prior median sternotomy.

Persisting interstitial and airspace opacities of the left greater
than right lungs. Low lung volumes.

.
IMPRESSION: Persistently low lung volumes with left-greater-than-right
interstitial and airspace opacities, potentially atelectasis and/or
consolidation. Left pleural effusion cannot be excluded.

Unchanged position of endotracheal tube, terminating suitably above
the carina.

Unchanged additional support apparatus as above.

## 2016-04-30 IMAGING — CR DG CHEST 1V PORT
1 series · 1 of 1 positions shown · non-contrast
Comparison: 04/01/2015 and prior radiographs

CLINICAL DATA: Central line placement.

EXAM:
PORTABLE CHEST - 1 VIEW

[AP]
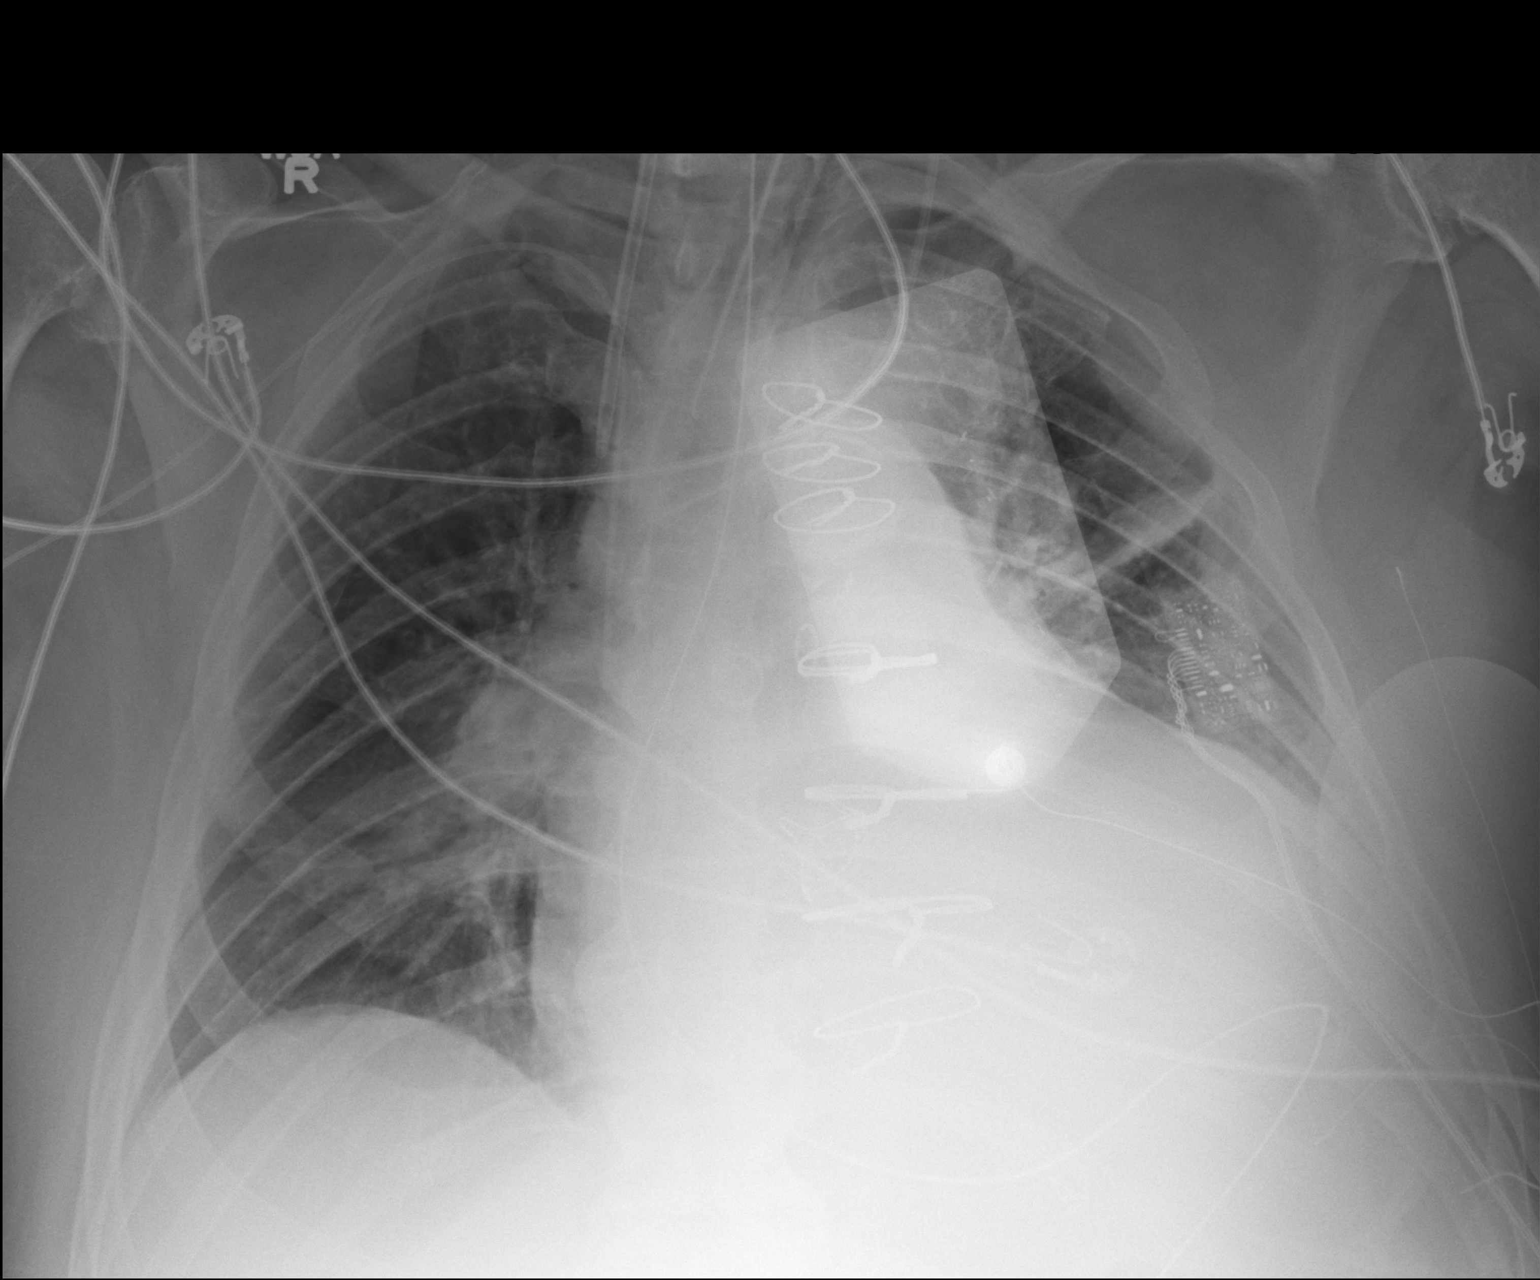

[1 of 1 positions shown; findings below may reference images not displayed]

FINDINGS: Cardiomegaly and CABG changes again identified.

A right IJ central venous catheter is now noted with tip overlying
the upper SVC.

There is no evidence of pneumothorax.

A defibrillator pad overlying the left chest noted.

An endotracheal tube with tip 4.5 cm above the carina, right PICC
line with tip overlying the lower SVC, left IJ central venous
catheter with tip overlying the mid SVC and NG tube entering the
stomach with tip off the field of view again noted.

Left lower lung consolidation/atelectasis, point vascular congestion
and subsegmental left upper lung atelectasis again noted.
IMPRESSION: Right IJ central venous catheter placement with tip overlying the
upper SVC. No evidence of pneumothorax.

Otherwise unchanged appearance of the chest with support apparatus
described and left lower lung consolidation/ atelectasis.
